# Patient Record
Sex: Male | Born: 1980 | Race: Black or African American | Hispanic: No | Marital: Single | Smoking: Current every day smoker
Health system: Southern US, Community
[De-identification: ages and names within clinical notes are randomized; demographics above are authoritative.]

## PROBLEM LIST (undated history)

## (undated) DIAGNOSIS — K219 Gastro-esophageal reflux disease without esophagitis: Secondary | ICD-10-CM

## (undated) DIAGNOSIS — F410 Panic disorder [episodic paroxysmal anxiety] without agoraphobia: Secondary | ICD-10-CM

## (undated) DIAGNOSIS — IMO0001 Reserved for inherently not codable concepts without codable children: Secondary | ICD-10-CM

## (undated) DIAGNOSIS — F419 Anxiety disorder, unspecified: Secondary | ICD-10-CM

## (undated) HISTORY — PX: URETHRA SURGERY: SHX824

---

## 2004-12-29 ENCOUNTER — Emergency Department: Payer: Self-pay | Admitting: Emergency Medicine

## 2005-06-06 ENCOUNTER — Emergency Department: Payer: Self-pay | Admitting: Emergency Medicine

## 2005-07-02 ENCOUNTER — Emergency Department: Payer: Self-pay | Admitting: General Practice

## 2005-07-29 ENCOUNTER — Emergency Department: Payer: Self-pay | Admitting: Unknown Physician Specialty

## 2005-08-19 ENCOUNTER — Emergency Department: Payer: Self-pay | Admitting: Emergency Medicine

## 2005-09-29 ENCOUNTER — Emergency Department: Payer: Self-pay | Admitting: Emergency Medicine

## 2005-10-04 ENCOUNTER — Emergency Department: Payer: Self-pay | Admitting: Emergency Medicine

## 2005-10-06 ENCOUNTER — Emergency Department: Payer: Self-pay | Admitting: Emergency Medicine

## 2005-10-07 ENCOUNTER — Emergency Department: Payer: Self-pay | Admitting: Emergency Medicine

## 2005-11-11 ENCOUNTER — Emergency Department: Payer: Self-pay | Admitting: Emergency Medicine

## 2006-01-15 ENCOUNTER — Emergency Department: Payer: Self-pay | Admitting: Emergency Medicine

## 2006-03-27 ENCOUNTER — Emergency Department: Payer: Self-pay | Admitting: Emergency Medicine

## 2006-05-16 ENCOUNTER — Emergency Department: Payer: Self-pay | Admitting: Emergency Medicine

## 2006-07-08 ENCOUNTER — Emergency Department: Payer: Self-pay | Admitting: Emergency Medicine

## 2006-07-21 ENCOUNTER — Emergency Department: Payer: Self-pay | Admitting: Emergency Medicine

## 2006-10-20 ENCOUNTER — Emergency Department: Payer: Self-pay | Admitting: Emergency Medicine

## 2006-10-22 ENCOUNTER — Emergency Department: Payer: Self-pay | Admitting: Internal Medicine

## 2006-10-23 ENCOUNTER — Emergency Department: Payer: Self-pay | Admitting: Unknown Physician Specialty

## 2006-11-20 ENCOUNTER — Emergency Department: Payer: Self-pay | Admitting: Emergency Medicine

## 2006-12-01 ENCOUNTER — Emergency Department: Payer: Self-pay | Admitting: Emergency Medicine

## 2006-12-24 ENCOUNTER — Emergency Department: Payer: Self-pay | Admitting: Emergency Medicine

## 2007-04-07 ENCOUNTER — Emergency Department: Payer: Self-pay | Admitting: Emergency Medicine

## 2007-04-28 ENCOUNTER — Emergency Department: Payer: Self-pay | Admitting: Emergency Medicine

## 2007-04-30 ENCOUNTER — Emergency Department: Payer: Self-pay | Admitting: Emergency Medicine

## 2007-05-04 ENCOUNTER — Other Ambulatory Visit: Payer: Self-pay

## 2007-05-04 ENCOUNTER — Emergency Department: Payer: Self-pay | Admitting: Emergency Medicine

## 2007-05-09 ENCOUNTER — Emergency Department: Payer: Self-pay | Admitting: Emergency Medicine

## 2007-07-10 ENCOUNTER — Emergency Department: Payer: Self-pay | Admitting: Emergency Medicine

## 2007-07-18 ENCOUNTER — Emergency Department: Payer: Self-pay | Admitting: Emergency Medicine

## 2007-10-02 ENCOUNTER — Emergency Department: Payer: Self-pay | Admitting: Emergency Medicine

## 2007-10-08 ENCOUNTER — Ambulatory Visit: Payer: Self-pay | Admitting: Surgery

## 2007-10-17 ENCOUNTER — Emergency Department: Payer: Self-pay | Admitting: Emergency Medicine

## 2007-12-04 ENCOUNTER — Other Ambulatory Visit: Payer: Self-pay

## 2007-12-04 ENCOUNTER — Emergency Department: Payer: Self-pay | Admitting: Emergency Medicine

## 2008-01-17 ENCOUNTER — Emergency Department: Payer: Self-pay | Admitting: Emergency Medicine

## 2008-02-20 ENCOUNTER — Emergency Department: Payer: Self-pay | Admitting: Unknown Physician Specialty

## 2008-04-22 ENCOUNTER — Emergency Department: Payer: Self-pay | Admitting: Emergency Medicine

## 2008-06-08 ENCOUNTER — Emergency Department: Payer: Self-pay | Admitting: Emergency Medicine

## 2008-08-04 ENCOUNTER — Emergency Department: Payer: Self-pay | Admitting: Emergency Medicine

## 2008-09-18 ENCOUNTER — Emergency Department: Payer: Self-pay | Admitting: Unknown Physician Specialty

## 2008-12-25 ENCOUNTER — Emergency Department: Payer: Self-pay | Admitting: Emergency Medicine

## 2009-05-04 ENCOUNTER — Emergency Department: Payer: Self-pay | Admitting: Emergency Medicine

## 2009-07-15 ENCOUNTER — Emergency Department: Payer: Self-pay | Admitting: Emergency Medicine

## 2009-09-24 ENCOUNTER — Emergency Department: Payer: Self-pay | Admitting: Emergency Medicine

## 2009-10-17 ENCOUNTER — Emergency Department: Payer: Self-pay | Admitting: Internal Medicine

## 2009-12-28 ENCOUNTER — Emergency Department: Payer: Self-pay | Admitting: Internal Medicine

## 2009-12-30 ENCOUNTER — Emergency Department: Payer: Self-pay | Admitting: Emergency Medicine

## 2010-02-15 ENCOUNTER — Emergency Department: Payer: Self-pay | Admitting: Emergency Medicine

## 2010-03-14 ENCOUNTER — Emergency Department: Payer: Self-pay | Admitting: Emergency Medicine

## 2010-04-06 ENCOUNTER — Emergency Department: Payer: Self-pay | Admitting: Emergency Medicine

## 2010-04-15 ENCOUNTER — Emergency Department: Payer: Self-pay | Admitting: Emergency Medicine

## 2010-04-18 ENCOUNTER — Emergency Department: Payer: Self-pay | Admitting: Emergency Medicine

## 2010-04-23 ENCOUNTER — Emergency Department: Payer: Self-pay | Admitting: Emergency Medicine

## 2010-04-28 ENCOUNTER — Emergency Department: Payer: Self-pay | Admitting: Unknown Physician Specialty

## 2010-05-04 ENCOUNTER — Emergency Department: Payer: Self-pay | Admitting: Emergency Medicine

## 2010-05-05 ENCOUNTER — Emergency Department: Payer: Self-pay | Admitting: Emergency Medicine

## 2010-05-09 ENCOUNTER — Emergency Department: Payer: Self-pay | Admitting: Emergency Medicine

## 2010-05-14 ENCOUNTER — Emergency Department: Payer: Self-pay | Admitting: Emergency Medicine

## 2010-05-16 ENCOUNTER — Emergency Department: Payer: Self-pay | Admitting: Internal Medicine

## 2010-05-17 ENCOUNTER — Emergency Department: Payer: Self-pay | Admitting: Emergency Medicine

## 2010-05-21 ENCOUNTER — Emergency Department: Payer: Self-pay | Admitting: Emergency Medicine

## 2010-05-28 ENCOUNTER — Emergency Department: Payer: Self-pay | Admitting: Emergency Medicine

## 2010-05-30 ENCOUNTER — Emergency Department: Payer: Self-pay | Admitting: Emergency Medicine

## 2010-06-04 ENCOUNTER — Emergency Department: Payer: Self-pay | Admitting: Emergency Medicine

## 2010-06-08 ENCOUNTER — Emergency Department: Payer: Self-pay | Admitting: Emergency Medicine

## 2010-06-11 ENCOUNTER — Emergency Department: Payer: Self-pay | Admitting: Emergency Medicine

## 2010-06-13 ENCOUNTER — Emergency Department: Payer: Self-pay | Admitting: Emergency Medicine

## 2010-06-18 ENCOUNTER — Emergency Department: Payer: Self-pay | Admitting: Emergency Medicine

## 2010-06-20 ENCOUNTER — Emergency Department: Payer: Self-pay | Admitting: Emergency Medicine

## 2010-06-23 ENCOUNTER — Emergency Department: Payer: Self-pay | Admitting: Emergency Medicine

## 2010-06-29 ENCOUNTER — Emergency Department: Payer: Self-pay | Admitting: Emergency Medicine

## 2010-07-01 ENCOUNTER — Emergency Department: Payer: Self-pay | Admitting: Emergency Medicine

## 2010-07-07 ENCOUNTER — Emergency Department: Payer: Self-pay | Admitting: Emergency Medicine

## 2010-07-21 ENCOUNTER — Emergency Department: Payer: Self-pay | Admitting: Emergency Medicine

## 2010-07-25 ENCOUNTER — Emergency Department: Payer: Self-pay | Admitting: Emergency Medicine

## 2010-09-12 ENCOUNTER — Emergency Department: Payer: Self-pay | Admitting: Internal Medicine

## 2010-09-26 ENCOUNTER — Emergency Department: Payer: Self-pay | Admitting: Emergency Medicine

## 2010-10-24 ENCOUNTER — Emergency Department: Payer: Self-pay | Admitting: Emergency Medicine

## 2010-12-07 ENCOUNTER — Emergency Department: Payer: Self-pay | Admitting: Unknown Physician Specialty

## 2010-12-21 ENCOUNTER — Emergency Department: Payer: Self-pay | Admitting: Emergency Medicine

## 2010-12-24 ENCOUNTER — Emergency Department: Payer: Self-pay | Admitting: Internal Medicine

## 2011-02-07 ENCOUNTER — Emergency Department: Payer: Self-pay | Admitting: Emergency Medicine

## 2011-04-21 ENCOUNTER — Emergency Department: Payer: Self-pay | Admitting: Emergency Medicine

## 2011-09-04 ENCOUNTER — Encounter: Payer: Self-pay | Admitting: Emergency Medicine

## 2011-09-04 ENCOUNTER — Emergency Department (HOSPITAL_COMMUNITY)
Admission: EM | Admit: 2011-09-04 | Discharge: 2011-09-05 | Disposition: A | Discharge status: Home / Self Care | Payer: Self-pay | Attending: Emergency Medicine | Admitting: Emergency Medicine

## 2011-09-04 DIAGNOSIS — K120 Recurrent oral aphthae: Secondary | ICD-10-CM | POA: Insufficient documentation

## 2011-09-04 DIAGNOSIS — F17200 Nicotine dependence, unspecified, uncomplicated: Secondary | ICD-10-CM | POA: Insufficient documentation

## 2011-09-04 DIAGNOSIS — R599 Enlarged lymph nodes, unspecified: Secondary | ICD-10-CM | POA: Insufficient documentation

## 2011-09-04 DIAGNOSIS — J45909 Unspecified asthma, uncomplicated: Secondary | ICD-10-CM | POA: Insufficient documentation

## 2011-09-04 DIAGNOSIS — R220 Localized swelling, mass and lump, head: Secondary | ICD-10-CM | POA: Insufficient documentation

## 2011-09-04 DIAGNOSIS — R0602 Shortness of breath: Secondary | ICD-10-CM | POA: Insufficient documentation

## 2011-09-04 DIAGNOSIS — R221 Localized swelling, mass and lump, neck: Secondary | ICD-10-CM | POA: Insufficient documentation

## 2011-09-04 NOTE — ED Notes (Signed)
Patient states he was SOB for several days but today feels better but not has noticed swelling to his right lower jaw and neck area.  Denies fever

## 2011-09-04 NOTE — ED Notes (Signed)
PT. REPORTS SOB WITH PRODUCTIVE COUGH AND RIGHT SORE THROAT FOR 3 DAYS . DENIES FEVER OR CHILLS.

## 2011-09-05 NOTE — ED Provider Notes (Signed)
History     CSN: [id number]  Arrival date & time 09/04/11  2225   First MD Initiated Contact with Patient 09/04/11 2336      Chief Complaint  Patient presents with  . Shortness of Breath    (Consider location/radiation/quality/duration/timing/severity/associated sxs/prior treatment) HPI Comments: Patient with a history of asthma has been using his inhaler for the past couple days.  He has not needed.  Today and is no longer short of breath, but he does have a canker sore in his right lower posterior cheek, now with lymphadenopathy to the mandible area that is quite concerning to this patient  Patient is a 31 y.o. male presenting with shortness of breath. The history is provided by the patient.  Shortness of Breath  The current episode started 3 to 5 days ago. The problem occurs rarely. The problem has been gradually worsening. The problem is moderate. The symptoms are relieved by nothing. Pertinent negatives include no sore throat, no shortness of breath and no wheezing.    Past Medical History  Diagnosis Date  . Asthma     Past Surgical History  Procedure Date  . Urethra surgery     No family history on file.  History  Substance Use Topics  . Smoking status: Current Everyday Smoker  . Smokeless tobacco: Not on file  . Alcohol Use: Yes      Review of Systems  Constitutional: Negative.   HENT: Positive for facial swelling. Negative for sore throat, neck pain and neck stiffness.   Respiratory: Negative for shortness of breath and wheezing.   Genitourinary: Negative.   Musculoskeletal: Negative.   Skin: Negative.   Neurological: Negative for dizziness.  Hematological: Negative.   Psychiatric/Behavioral: Negative.     Allergies  Codeine  Home Medications  No current outpatient prescriptions on file.  BP 117/65  Pulse 100  Temp(Src) 98.5 F (36.9 C) (Oral)  Resp 18  SpO2 97%  Physical Exam  Constitutional: He is oriented to person, place, and time. He  appears well-developed.  Eyes: Pupils are equal, round, and reactive to light.  Neck:    Cardiovascular: Normal rate.   Pulmonary/Chest: Effort normal. No respiratory distress. He has no wheezes.  Musculoskeletal: Normal range of motion.  Neurological: He is oriented to person, place, and time.  Skin: Skin is warm.    ED Course  Procedures (including critical care time)  Labs Reviewed - No data to display No results found.   1. Canker sores oral       MDM  Canker sore with lymphadenopathy, most likely secondary infection.  Will treat with oral tetracycline, that he we'll hold in his mouth for 3-5 minutes and then swallow        Arman Filter, NP 09/05/11 0101  Arman Filter, NP 09/05/11 0110

## 2011-09-05 NOTE — ED Notes (Signed)
Discharge inst given  Voiced understanding 

## 2011-09-05 NOTE — ED Provider Notes (Signed)
Medical screening examination/treatment/procedure(s) were performed by non-physician practitioner and as supervising physician I was immediately available for consultation/collaboration.   Taleen Prosser, MD 09/05/11 0537 

## 2011-11-11 ENCOUNTER — Emergency Department: Payer: Self-pay | Admitting: Emergency Medicine

## 2011-12-21 ENCOUNTER — Emergency Department: Payer: Self-pay | Admitting: Emergency Medicine

## 2012-02-20 ENCOUNTER — Emergency Department: Payer: Self-pay | Admitting: Emergency Medicine

## 2012-03-26 ENCOUNTER — Emergency Department: Payer: Self-pay | Admitting: Emergency Medicine

## 2012-05-04 ENCOUNTER — Emergency Department: Payer: Self-pay | Admitting: Emergency Medicine

## 2012-08-20 ENCOUNTER — Emergency Department: Payer: Self-pay | Admitting: Emergency Medicine

## 2012-08-20 LAB — COMPREHENSIVE METABOLIC PANEL
Alkaline Phosphatase: 62 U/L (ref 50–136)
Bilirubin,Total: 0.8 mg/dL (ref 0.2–1.0)
Chloride: 104 mmol/L (ref 98–107)
Co2: 25 mmol/L (ref 21–32)
Creatinine: 0.88 mg/dL (ref 0.60–1.30)
EGFR (Non-African Amer.): >60
Osmolality: 276 (ref 275–301)
Potassium: 3.9 mmol/L (ref 3.5–5.1)
SGOT(AST): 18 U/L (ref 15–37)
Sodium: 139 mmol/L (ref 136–145)

## 2012-08-20 LAB — LIPASE, BLOOD: Lipase: 160 U/L (ref 73–393)

## 2012-08-20 LAB — CBC
HCT: 43.6 % (ref 40.0–52.0)
MCH: 29.8 pg (ref 26.0–34.0)
RDW: 13.6 % (ref 11.5–14.5)

## 2012-09-07 ENCOUNTER — Emergency Department: Payer: Self-pay | Admitting: Emergency Medicine

## 2012-09-24 ENCOUNTER — Emergency Department: Payer: Self-pay | Admitting: Unknown Physician Specialty

## 2012-10-11 ENCOUNTER — Emergency Department: Payer: Self-pay | Admitting: Emergency Medicine

## 2012-10-17 ENCOUNTER — Emergency Department: Payer: Self-pay | Admitting: Emergency Medicine

## 2012-10-19 ENCOUNTER — Emergency Department: Payer: Self-pay | Admitting: Emergency Medicine

## 2013-01-15 ENCOUNTER — Emergency Department: Payer: Self-pay | Admitting: Unknown Physician Specialty

## 2013-02-05 ENCOUNTER — Emergency Department: Payer: Self-pay | Admitting: Emergency Medicine

## 2013-02-05 LAB — CBC
HCT: 42.1 % (ref 40.0–52.0)
HGB: 14.4 g/dL (ref 13.0–18.0)
MCH: 30.1 pg (ref 26.0–34.0)
MCHC: 34.2 g/dL (ref 32.0–36.0)
MCV: 88 fL (ref 80–100)
Platelet: 205 10*3/uL (ref 150–440)
RBC: 4.78 10*6/uL (ref 4.40–5.90)
RDW: 12.9 % (ref 11.5–14.5)

## 2013-02-05 LAB — BASIC METABOLIC PANEL
Anion Gap: 8 (ref 7–16)
BUN: 9 mg/dL (ref 7–18)
Co2: 25 mmol/L (ref 21–32)
Creatinine: 0.83 mg/dL (ref 0.60–1.30)
EGFR (African American): >60
Glucose: 119 mg/dL — ABNORMAL HIGH (ref 65–99)
Potassium: 3.2 mmol/L — ABNORMAL LOW (ref 3.5–5.1)
Sodium: 140 mmol/L (ref 136–145)

## 2013-02-05 LAB — HEPATIC FUNCTION PANEL A (ARMC)
Albumin: 3.8 g/dL (ref 3.4–5.0)
Alkaline Phosphatase: 58 U/L (ref 50–136)
SGOT(AST): 33 U/L (ref 15–37)
SGPT (ALT): 50 U/L (ref 12–78)

## 2013-02-18 ENCOUNTER — Emergency Department: Payer: Self-pay | Admitting: Emergency Medicine

## 2013-04-08 ENCOUNTER — Emergency Department: Payer: Self-pay | Admitting: Emergency Medicine

## 2013-04-30 ENCOUNTER — Emergency Department: Payer: Self-pay | Admitting: Emergency Medicine

## 2013-06-17 ENCOUNTER — Emergency Department: Payer: Self-pay | Admitting: Emergency Medicine

## 2013-06-17 LAB — CBC
HGB: 14.1 g/dL (ref 13.0–18.0)
MCH: 30 pg (ref 26.0–34.0)

## 2013-06-17 LAB — BASIC METABOLIC PANEL
Anion Gap: 6 — ABNORMAL LOW (ref 7–16)
BUN: 10 mg/dL (ref 7–18)
Calcium, Total: 8.9 mg/dL (ref 8.5–10.1)
Chloride: 105 mmol/L (ref 98–107)
Co2: 27 mmol/L (ref 21–32)
EGFR (African American): >60
EGFR (Non-African Amer.): >60
Osmolality: 275 (ref 275–301)
Potassium: 3.3 mmol/L — ABNORMAL LOW (ref 3.5–5.1)

## 2013-06-17 LAB — TROPONIN I: Troponin-I: <0.02 ng/mL

## 2013-08-05 ENCOUNTER — Emergency Department: Payer: Self-pay | Admitting: Internal Medicine

## 2013-08-08 ENCOUNTER — Emergency Department: Payer: Self-pay | Admitting: Internal Medicine

## 2013-08-24 ENCOUNTER — Emergency Department: Payer: Self-pay | Admitting: Emergency Medicine

## 2013-09-07 ENCOUNTER — Emergency Department: Payer: Self-pay | Admitting: Emergency Medicine

## 2013-09-21 ENCOUNTER — Emergency Department: Payer: Self-pay | Admitting: Internal Medicine

## 2013-10-01 ENCOUNTER — Emergency Department: Payer: Self-pay | Admitting: Emergency Medicine

## 2013-10-23 ENCOUNTER — Emergency Department: Payer: Self-pay | Admitting: Emergency Medicine

## 2013-10-23 LAB — BASIC METABOLIC PANEL
Anion Gap: 5 — ABNORMAL LOW (ref 7–16)
BUN: 9 mg/dL (ref 7–18)
CHLORIDE: 105 mmol/L (ref 98–107)
Calcium, Total: 9.1 mg/dL (ref 8.5–10.1)
Co2: 27 mmol/L (ref 21–32)
Creatinine: 0.8 mg/dL (ref 0.60–1.30)
Glucose: 103 mg/dL — ABNORMAL HIGH (ref 65–99)
OSMOLALITY: 273 (ref 275–301)
Potassium: 3.7 mmol/L (ref 3.5–5.1)
SODIUM: 137 mmol/L (ref 136–145)

## 2013-10-23 LAB — CBC
HCT: 44.3 % (ref 40.0–52.0)
HGB: 15.1 g/dL (ref 13.0–18.0)
MCH: 30.7 pg (ref 26.0–34.0)
MCHC: 34.1 g/dL (ref 32.0–36.0)
MCV: 90 fL (ref 80–100)
PLATELETS: 192 10*3/uL (ref 150–440)
RBC: 4.93 10*6/uL (ref 4.40–5.90)
RDW: 13.8 % (ref 11.5–14.5)
WBC: 5.3 10*3/uL (ref 3.8–10.6)

## 2013-10-23 LAB — TROPONIN I: Troponin-I: <0.02 ng/mL

## 2013-10-29 ENCOUNTER — Emergency Department: Payer: Self-pay | Admitting: Emergency Medicine

## 2013-10-31 ENCOUNTER — Emergency Department: Payer: Self-pay | Admitting: Emergency Medicine

## 2013-11-05 ENCOUNTER — Emergency Department: Payer: Self-pay | Admitting: Emergency Medicine

## 2013-12-04 ENCOUNTER — Emergency Department: Payer: Self-pay | Admitting: Emergency Medicine

## 2013-12-04 LAB — CBC
HCT: 46.1 % (ref 40.0–52.0)
HGB: 15.8 g/dL (ref 13.0–18.0)
MCH: 31 pg (ref 26.0–34.0)
MCHC: 34.2 g/dL (ref 32.0–36.0)
MCV: 91 fL (ref 80–100)
Platelet: 205 10*3/uL (ref 150–440)
RBC: 5.09 10*6/uL (ref 4.40–5.90)
RDW: 13.2 % (ref 11.5–14.5)
WBC: 7.2 10*3/uL (ref 3.8–10.6)

## 2013-12-04 LAB — URINALYSIS, COMPLETE
BILIRUBIN, UR: NEGATIVE
BLOOD: NEGATIVE
Bacteria: NONE SEEN
Glucose,UR: NEGATIVE mg/dL (ref 0–75)
Ketone: NEGATIVE
Leukocyte Esterase: NEGATIVE
Nitrite: NEGATIVE
Ph: 6 (ref 4.5–8.0)
Protein: NEGATIVE
Specific Gravity: 1.02 (ref 1.003–1.030)
Squamous Epithelial: 1
WBC UR: 2 /HPF (ref 0–5)

## 2013-12-04 LAB — COMPREHENSIVE METABOLIC PANEL WITH GFR
Albumin: 4 g/dL
Alkaline Phosphatase: 62 U/L
Anion Gap: 3 — ABNORMAL LOW
BUN: 12 mg/dL
Bilirubin,Total: 0.6 mg/dL
Calcium, Total: 9.2 mg/dL
Chloride: 102 mmol/L
Co2: 30 mmol/L
Creatinine: 0.87 mg/dL
EGFR (African American): >60
EGFR (Non-African Amer.): >60
Glucose: 87 mg/dL
Osmolality: 269
Potassium: 3.4 mmol/L — ABNORMAL LOW
SGOT(AST): 34 U/L
SGPT (ALT): 53 U/L
Sodium: 135 mmol/L — ABNORMAL LOW
Total Protein: 8.3 g/dL — ABNORMAL HIGH

## 2013-12-20 ENCOUNTER — Emergency Department: Payer: Self-pay | Admitting: Emergency Medicine

## 2013-12-20 LAB — URINALYSIS, COMPLETE
BILIRUBIN, UR: NEGATIVE
BLOOD: NEGATIVE
Bacteria: NONE SEEN
GLUCOSE, UR: NEGATIVE mg/dL (ref 0–75)
Ketone: NEGATIVE
Leukocyte Esterase: NEGATIVE
Nitrite: NEGATIVE
PH: 5 (ref 4.5–8.0)
Protein: NEGATIVE
RBC, UR: NONE SEEN /HPF (ref 0–5)
Specific Gravity: 1.023 (ref 1.003–1.030)
Squamous Epithelial: <1
WBC UR: 2 /HPF (ref 0–5)

## 2013-12-22 LAB — BETA STREP CULTURE(ARMC)

## 2013-12-24 ENCOUNTER — Emergency Department: Payer: Self-pay | Admitting: Emergency Medicine

## 2014-01-15 ENCOUNTER — Emergency Department: Payer: Self-pay

## 2014-01-15 LAB — CBC
HCT: 46.2 % (ref 40.0–52.0)
HGB: 15.3 g/dL (ref 13.0–18.0)
MCH: 29.7 pg (ref 26.0–34.0)
MCHC: 33 g/dL (ref 32.0–36.0)
MCV: 90 fL (ref 80–100)
Platelet: 240 10*3/uL (ref 150–440)
RBC: 5.14 10*6/uL (ref 4.40–5.90)
RDW: 13.2 % (ref 11.5–14.5)
WBC: 5.5 10*3/uL (ref 3.8–10.6)

## 2014-01-15 LAB — BASIC METABOLIC PANEL
Anion Gap: 6 — ABNORMAL LOW (ref 7–16)
BUN: 8 mg/dL (ref 7–18)
CHLORIDE: 103 mmol/L (ref 98–107)
CREATININE: 0.84 mg/dL (ref 0.60–1.30)
Calcium, Total: 8.9 mg/dL (ref 8.5–10.1)
Co2: 27 mmol/L (ref 21–32)
EGFR (Non-African Amer.): >60
GLUCOSE: 117 mg/dL — AB (ref 65–99)
OSMOLALITY: 271 (ref 275–301)
Potassium: 3.5 mmol/L (ref 3.5–5.1)
Sodium: 136 mmol/L (ref 136–145)

## 2014-01-15 LAB — TROPONIN I

## 2014-01-18 ENCOUNTER — Emergency Department: Payer: Self-pay | Admitting: Emergency Medicine

## 2014-01-30 ENCOUNTER — Emergency Department: Payer: Self-pay | Admitting: Emergency Medicine

## 2014-02-08 ENCOUNTER — Emergency Department: Payer: Self-pay | Admitting: Emergency Medicine

## 2014-02-08 LAB — BASIC METABOLIC PANEL
Anion Gap: 6 — ABNORMAL LOW (ref 7–16)
BUN: 10 mg/dL (ref 7–18)
CALCIUM: 9.5 mg/dL (ref 8.5–10.1)
CHLORIDE: 102 mmol/L (ref 98–107)
CREATININE: 0.86 mg/dL (ref 0.60–1.30)
Co2: 24 mmol/L (ref 21–32)
EGFR (African American): >60
Glucose: 91 mg/dL (ref 65–99)
OSMOLALITY: 263 (ref 275–301)
POTASSIUM: 3.4 mmol/L — AB (ref 3.5–5.1)
SODIUM: 132 mmol/L — AB (ref 136–145)

## 2014-02-08 LAB — CBC
HCT: 44.2 % (ref 40.0–52.0)
HGB: 15.2 g/dL (ref 13.0–18.0)
MCH: 31.1 pg (ref 26.0–34.0)
MCHC: 34.3 g/dL (ref 32.0–36.0)
MCV: 91 fL (ref 80–100)
PLATELETS: 218 10*3/uL (ref 150–440)
RBC: 4.88 10*6/uL (ref 4.40–5.90)
RDW: 13.2 % (ref 11.5–14.5)
WBC: 6.4 10*3/uL (ref 3.8–10.6)

## 2014-02-08 LAB — TROPONIN I: Troponin-I: <0.02 ng/mL

## 2014-02-15 ENCOUNTER — Emergency Department: Payer: Self-pay | Admitting: Emergency Medicine

## 2014-02-15 LAB — BASIC METABOLIC PANEL
Anion Gap: 11 (ref 7–16)
BUN: 17 mg/dL (ref 7–18)
CALCIUM: 9 mg/dL (ref 8.5–10.1)
CREATININE: 1.02 mg/dL (ref 0.60–1.30)
Chloride: 102 mmol/L (ref 98–107)
Co2: 23 mmol/L (ref 21–32)
GLUCOSE: 101 mg/dL — AB (ref 65–99)
Osmolality: 274 (ref 275–301)
Potassium: 3.3 mmol/L — ABNORMAL LOW (ref 3.5–5.1)
SODIUM: 136 mmol/L (ref 136–145)

## 2014-02-15 LAB — CBC
HCT: 46 % (ref 40.0–52.0)
HGB: 15.2 g/dL (ref 13.0–18.0)
MCH: 30 pg (ref 26.0–34.0)
MCHC: 33.1 g/dL (ref 32.0–36.0)
MCV: 91 fL (ref 80–100)
Platelet: 205 10*3/uL (ref 150–440)
RBC: 5.06 10*6/uL (ref 4.40–5.90)
RDW: 13.3 % (ref 11.5–14.5)
WBC: 8.9 10*3/uL (ref 3.8–10.6)

## 2014-02-15 LAB — TROPONIN I

## 2014-03-13 ENCOUNTER — Emergency Department: Payer: Self-pay | Admitting: Emergency Medicine

## 2014-03-19 ENCOUNTER — Emergency Department: Payer: Self-pay | Admitting: Emergency Medicine

## 2014-04-02 ENCOUNTER — Emergency Department: Payer: Self-pay | Admitting: Emergency Medicine

## 2014-04-09 ENCOUNTER — Emergency Department: Payer: Self-pay | Admitting: Emergency Medicine

## 2014-04-13 ENCOUNTER — Emergency Department: Payer: Self-pay | Admitting: Emergency Medicine

## 2014-04-26 ENCOUNTER — Emergency Department: Payer: Self-pay | Admitting: Emergency Medicine

## 2014-05-05 ENCOUNTER — Emergency Department: Payer: Self-pay | Admitting: Emergency Medicine

## 2014-05-05 LAB — CBC WITH DIFFERENTIAL/PLATELET
Basophil #: 0 10*3/uL (ref 0.0–0.1)
Basophil %: 0.7 %
Eosinophil #: 0.2 10*3/uL (ref 0.0–0.7)
Eosinophil %: 2.4 %
HCT: 43.7 % (ref 40.0–52.0)
HGB: 15 g/dL (ref 13.0–18.0)
LYMPHS ABS: 1.8 10*3/uL (ref 1.0–3.6)
Lymphocyte %: 27.3 %
MCH: 31 pg (ref 26.0–34.0)
MCHC: 34.3 g/dL (ref 32.0–36.0)
MCV: 90 fL (ref 80–100)
MONOS PCT: 9.8 %
Monocyte #: 0.6 x10 3/mm (ref 0.2–1.0)
NEUTROS ABS: 3.9 10*3/uL (ref 1.4–6.5)
NEUTROS PCT: 59.8 %
Platelet: 203 10*3/uL (ref 150–440)
RBC: 4.83 10*6/uL (ref 4.40–5.90)
RDW: 12.8 % (ref 11.5–14.5)
WBC: 6.5 10*3/uL (ref 3.8–10.6)

## 2014-05-05 LAB — COMPREHENSIVE METABOLIC PANEL
ALBUMIN: 3.9 g/dL (ref 3.4–5.0)
ALK PHOS: 58 U/L
ANION GAP: 11 (ref 7–16)
AST: 42 U/L — AB (ref 15–37)
BILIRUBIN TOTAL: 0.7 mg/dL (ref 0.2–1.0)
BUN: 12 mg/dL (ref 7–18)
CREATININE: 0.91 mg/dL (ref 0.60–1.30)
Calcium, Total: 9.4 mg/dL (ref 8.5–10.1)
Chloride: 101 mmol/L (ref 98–107)
Co2: 27 mmol/L (ref 21–32)
EGFR (African American): >60
GLUCOSE: 100 mg/dL — AB (ref 65–99)
Osmolality: 277 (ref 275–301)
POTASSIUM: 3.5 mmol/L (ref 3.5–5.1)
SGPT (ALT): 65 U/L — ABNORMAL HIGH
Sodium: 139 mmol/L (ref 136–145)
TOTAL PROTEIN: 8 g/dL (ref 6.4–8.2)

## 2014-05-05 LAB — URINALYSIS, COMPLETE
Bacteria: NONE SEEN
Bilirubin,UR: NEGATIVE
Blood: NEGATIVE
Glucose,UR: NEGATIVE mg/dL (ref 0–75)
Ketone: NEGATIVE
LEUKOCYTE ESTERASE: NEGATIVE
Nitrite: NEGATIVE
PROTEIN: NEGATIVE
Ph: 6 (ref 4.5–8.0)
RBC,UR: <1 /HPF (ref 0–5)
Specific Gravity: 1.017 (ref 1.003–1.030)
Squamous Epithelial: 2

## 2014-05-05 LAB — DRUG SCREEN, URINE
Amphetamines, Ur Screen: NEGATIVE (ref ?–1000)
Barbiturates, Ur Screen: NEGATIVE (ref ?–200)
Benzodiazepine, Ur Scrn: NEGATIVE (ref ?–200)
COCAINE METABOLITE, UR ~~LOC~~: NEGATIVE (ref ?–300)
Cannabinoid 50 Ng, Ur ~~LOC~~: NEGATIVE (ref ?–50)
MDMA (ECSTASY) UR SCREEN: NEGATIVE (ref ?–500)
Methadone, Ur Screen: NEGATIVE (ref ?–300)
OPIATE, UR SCREEN: NEGATIVE (ref ?–300)
Phencyclidine (PCP) Ur S: NEGATIVE (ref ?–25)
Tricyclic, Ur Screen: NEGATIVE (ref ?–1000)

## 2014-05-05 LAB — ETHANOL: Ethanol: <3 mg/dL

## 2014-05-05 LAB — TROPONIN I: Troponin-I: <0.02 ng/mL

## 2014-05-05 LAB — LIPASE, BLOOD: Lipase: 227 U/L (ref 73–393)

## 2014-05-13 ENCOUNTER — Emergency Department: Payer: Self-pay | Admitting: Emergency Medicine

## 2014-05-13 LAB — TROPONIN I

## 2014-05-13 LAB — CBC WITH DIFFERENTIAL/PLATELET
Basophil #: 0 10*3/uL (ref 0.0–0.1)
Basophil %: 0.7 %
EOS PCT: 1.9 %
Eosinophil #: 0.1 10*3/uL (ref 0.0–0.7)
HCT: 44.8 % (ref 40.0–52.0)
HGB: 15.3 g/dL (ref 13.0–18.0)
LYMPHS ABS: 1.5 10*3/uL (ref 1.0–3.6)
Lymphocyte %: 27.9 %
MCH: 30.7 pg (ref 26.0–34.0)
MCHC: 34.2 g/dL (ref 32.0–36.0)
MCV: 90 fL (ref 80–100)
Monocyte #: 0.5 x10 3/mm (ref 0.2–1.0)
Monocyte %: 10.1 %
Neutrophil #: 3.2 10*3/uL (ref 1.4–6.5)
Neutrophil %: 59.4 %
Platelet: 208 10*3/uL (ref 150–440)
RBC: 4.99 10*6/uL (ref 4.40–5.90)
RDW: 13.3 % (ref 11.5–14.5)
WBC: 5.4 10*3/uL (ref 3.8–10.6)

## 2014-05-13 LAB — COMPREHENSIVE METABOLIC PANEL
ALK PHOS: 53 U/L
ANION GAP: 9 (ref 7–16)
Albumin: 3.6 g/dL (ref 3.4–5.0)
BILIRUBIN TOTAL: 0.7 mg/dL (ref 0.2–1.0)
BUN: 8 mg/dL (ref 7–18)
CHLORIDE: 104 mmol/L (ref 98–107)
Calcium, Total: 8.5 mg/dL (ref 8.5–10.1)
Co2: 26 mmol/L (ref 21–32)
Creatinine: 0.91 mg/dL (ref 0.60–1.30)
Glucose: 93 mg/dL (ref 65–99)
OSMOLALITY: 276 (ref 275–301)
Potassium: 3.9 mmol/L (ref 3.5–5.1)
SGOT(AST): 42 U/L — ABNORMAL HIGH (ref 15–37)
SGPT (ALT): 50 U/L
Sodium: 139 mmol/L (ref 136–145)
Total Protein: 7.6 g/dL (ref 6.4–8.2)

## 2014-05-13 LAB — LIPASE, BLOOD: LIPASE: 141 U/L (ref 73–393)

## 2014-05-18 ENCOUNTER — Emergency Department: Payer: Self-pay | Admitting: Emergency Medicine

## 2014-05-24 ENCOUNTER — Emergency Department: Payer: Self-pay | Admitting: Emergency Medicine

## 2014-06-01 ENCOUNTER — Emergency Department: Payer: Self-pay | Admitting: Emergency Medicine

## 2014-06-07 ENCOUNTER — Emergency Department: Payer: Self-pay | Admitting: Emergency Medicine

## 2014-06-16 DIAGNOSIS — F419 Anxiety disorder, unspecified: Secondary | ICD-10-CM | POA: Insufficient documentation

## 2014-06-16 DIAGNOSIS — K219 Gastro-esophageal reflux disease without esophagitis: Secondary | ICD-10-CM | POA: Insufficient documentation

## 2014-06-18 ENCOUNTER — Emergency Department: Payer: Self-pay | Admitting: Emergency Medicine

## 2014-06-18 LAB — COMPREHENSIVE METABOLIC PANEL
ALBUMIN: 3.7 g/dL (ref 3.4–5.0)
ALT: 49 U/L
ANION GAP: 11 (ref 7–16)
Alkaline Phosphatase: 54 U/L
BUN: 11 mg/dL (ref 7–18)
Bilirubin,Total: 0.6 mg/dL (ref 0.2–1.0)
CALCIUM: 9 mg/dL (ref 8.5–10.1)
CHLORIDE: 103 mmol/L (ref 98–107)
Co2: 26 mmol/L (ref 21–32)
Creatinine: 0.9 mg/dL (ref 0.60–1.30)
EGFR (African American): >60
Glucose: 96 mg/dL (ref 65–99)
OSMOLALITY: 279 (ref 275–301)
Potassium: 3.5 mmol/L (ref 3.5–5.1)
SGOT(AST): 32 U/L (ref 15–37)
SODIUM: 140 mmol/L (ref 136–145)
TOTAL PROTEIN: 7.8 g/dL (ref 6.4–8.2)

## 2014-06-18 LAB — URINALYSIS, COMPLETE
BILIRUBIN, UR: NEGATIVE
BLOOD: NEGATIVE
Bacteria: NONE SEEN
Glucose,UR: NEGATIVE mg/dL (ref 0–75)
Ketone: NEGATIVE
Leukocyte Esterase: NEGATIVE
NITRITE: NEGATIVE
Ph: 6 (ref 4.5–8.0)
Protein: NEGATIVE
RBC,UR: <1 /HPF (ref 0–5)
Specific Gravity: 1.015 (ref 1.003–1.030)
Squamous Epithelial: 1
WBC UR: 3 /HPF (ref 0–5)

## 2014-06-18 LAB — CBC WITH DIFFERENTIAL/PLATELET
BASOS PCT: 1.6 %
Basophil #: 0.1 10*3/uL (ref 0.0–0.1)
EOS ABS: 0.1 10*3/uL (ref 0.0–0.7)
Eosinophil %: 1.8 %
HCT: 43.4 % (ref 40.0–52.0)
HGB: 14.7 g/dL (ref 13.0–18.0)
Lymphocyte #: 2.1 10*3/uL (ref 1.0–3.6)
Lymphocyte %: 33.7 %
MCH: 30.6 pg (ref 26.0–34.0)
MCHC: 33.8 g/dL (ref 32.0–36.0)
MCV: 91 fL (ref 80–100)
MONO ABS: 0.3 x10 3/mm (ref 0.2–1.0)
MONOS PCT: 5.4 %
NEUTROS PCT: 57.5 %
Neutrophil #: 3.7 10*3/uL (ref 1.4–6.5)
PLATELETS: 217 10*3/uL (ref 150–440)
RBC: 4.79 10*6/uL (ref 4.40–5.90)
RDW: 13 % (ref 11.5–14.5)
WBC: 6.4 10*3/uL (ref 3.8–10.6)

## 2014-06-18 LAB — LIPASE, BLOOD: LIPASE: 247 U/L (ref 73–393)

## 2014-06-19 ENCOUNTER — Ambulatory Visit: Payer: Self-pay | Admitting: Gastroenterology

## 2014-07-15 ENCOUNTER — Emergency Department: Payer: Self-pay | Admitting: Emergency Medicine

## 2014-07-15 LAB — BASIC METABOLIC PANEL
ANION GAP: 6 — AB (ref 7–16)
BUN: 11 mg/dL (ref 7–18)
CREATININE: 0.86 mg/dL (ref 0.60–1.30)
Calcium, Total: 8.5 mg/dL (ref 8.5–10.1)
Chloride: 103 mmol/L (ref 98–107)
Co2: 26 mmol/L (ref 21–32)
EGFR (African American): >60
EGFR (Non-African Amer.): >60
Glucose: 102 mg/dL — ABNORMAL HIGH (ref 65–99)
Osmolality: 270 (ref 275–301)
Potassium: 3.6 mmol/L (ref 3.5–5.1)
Sodium: 135 mmol/L — ABNORMAL LOW (ref 136–145)

## 2014-07-15 LAB — CBC
HCT: 44.5 % (ref 40.0–52.0)
HGB: 14.7 g/dL (ref 13.0–18.0)
MCH: 30.1 pg (ref 26.0–34.0)
MCHC: 33 g/dL (ref 32.0–36.0)
MCV: 91 fL (ref 80–100)
Platelet: 241 10*3/uL (ref 150–440)
RBC: 4.87 10*6/uL (ref 4.40–5.90)
RDW: 13.2 % (ref 11.5–14.5)
WBC: 6.2 10*3/uL (ref 3.8–10.6)

## 2014-07-15 LAB — TROPONIN I

## 2014-07-23 ENCOUNTER — Emergency Department: Payer: Self-pay | Admitting: Emergency Medicine

## 2014-07-27 ENCOUNTER — Emergency Department: Payer: Self-pay | Admitting: Internal Medicine

## 2014-07-31 ENCOUNTER — Emergency Department: Payer: Self-pay | Admitting: Emergency Medicine

## 2014-07-31 LAB — URINALYSIS, COMPLETE
BACTERIA: NONE SEEN
BILIRUBIN, UR: NEGATIVE
Blood: NEGATIVE
Glucose,UR: NEGATIVE mg/dL (ref 0–75)
Ketone: NEGATIVE
Leukocyte Esterase: NEGATIVE
Nitrite: NEGATIVE
Ph: 6 (ref 4.5–8.0)
Protein: NEGATIVE
RBC, UR: NONE SEEN /HPF (ref 0–5)
Specific Gravity: 1.006 (ref 1.003–1.030)
WBC UR: 1 /HPF (ref 0–5)

## 2014-07-31 LAB — COMPREHENSIVE METABOLIC PANEL
ALBUMIN: 4 g/dL (ref 3.4–5.0)
ALT: 54 U/L
AST: 44 U/L — AB (ref 15–37)
Alkaline Phosphatase: 58 U/L
Anion Gap: 9 (ref 7–16)
BUN: 11 mg/dL (ref 7–18)
Bilirubin,Total: 0.7 mg/dL (ref 0.2–1.0)
CREATININE: 0.91 mg/dL (ref 0.60–1.30)
Calcium, Total: 9.1 mg/dL (ref 8.5–10.1)
Chloride: 98 mmol/L (ref 98–107)
Co2: 30 mmol/L (ref 21–32)
EGFR (African American): >60
Glucose: 101 mg/dL — ABNORMAL HIGH (ref 65–99)
OSMOLALITY: 273 (ref 275–301)
Potassium: 3.4 mmol/L — ABNORMAL LOW (ref 3.5–5.1)
SODIUM: 137 mmol/L (ref 136–145)
Total Protein: 8 g/dL (ref 6.4–8.2)

## 2014-07-31 LAB — CBC WITH DIFFERENTIAL/PLATELET
BASOS ABS: 0.1 10*3/uL (ref 0.0–0.1)
Basophil %: 0.9 %
EOS ABS: 0.1 10*3/uL (ref 0.0–0.7)
Eosinophil %: 1.5 %
HCT: 45.4 % (ref 40.0–52.0)
HGB: 15.4 g/dL (ref 13.0–18.0)
Lymphocyte #: 2 10*3/uL (ref 1.0–3.6)
Lymphocyte %: 25.5 %
MCH: 30.6 pg (ref 26.0–34.0)
MCHC: 33.8 g/dL (ref 32.0–36.0)
MCV: 91 fL (ref 80–100)
Monocyte #: 0.8 x10 3/mm (ref 0.2–1.0)
Monocyte %: 10.4 %
Neutrophil #: 4.9 10*3/uL (ref 1.4–6.5)
Neutrophil %: 61.7 %
PLATELETS: 240 10*3/uL (ref 150–440)
RBC: 5.02 10*6/uL (ref 4.40–5.90)
RDW: 13 % (ref 11.5–14.5)
WBC: 8 10*3/uL (ref 3.8–10.6)

## 2014-07-31 LAB — LIPASE, BLOOD: Lipase: 192 U/L (ref 73–393)

## 2014-08-03 ENCOUNTER — Emergency Department: Payer: Self-pay | Admitting: Emergency Medicine

## 2014-08-04 ENCOUNTER — Emergency Department: Payer: Self-pay | Admitting: Emergency Medicine

## 2014-08-05 LAB — BETA STREP CULTURE(ARMC)

## 2014-08-06 ENCOUNTER — Emergency Department: Payer: Self-pay | Admitting: Emergency Medicine

## 2014-08-11 ENCOUNTER — Emergency Department: Payer: Self-pay | Admitting: Emergency Medicine

## 2014-08-29 ENCOUNTER — Emergency Department: Payer: Self-pay | Admitting: Emergency Medicine

## 2014-09-04 ENCOUNTER — Emergency Department: Payer: Self-pay | Admitting: Emergency Medicine

## 2014-09-14 ENCOUNTER — Emergency Department: Payer: Self-pay | Admitting: Emergency Medicine

## 2014-09-14 LAB — URINALYSIS, COMPLETE
Bacteria: NONE SEEN
Bilirubin,UR: NEGATIVE
Blood: NEGATIVE
Glucose,UR: NEGATIVE mg/dL
Ketone: NEGATIVE
Leukocyte Esterase: NEGATIVE
Nitrite: NEGATIVE
Ph: 6
Protein: NEGATIVE
RBC,UR: 1 /HPF
Specific Gravity: 1.024
Squamous Epithelial: 2
WBC UR: 5 /HPF

## 2014-09-14 LAB — CBC WITH DIFFERENTIAL/PLATELET
BASOS ABS: 0.1 10*3/uL (ref 0.0–0.1)
Basophil %: 0.8 %
EOS ABS: 0.1 10*3/uL (ref 0.0–0.7)
Eosinophil %: 1.9 %
HCT: 44.3 % (ref 40.0–52.0)
HGB: 15.2 g/dL (ref 13.0–18.0)
Lymphocyte #: 2.3 10*3/uL (ref 1.0–3.6)
Lymphocyte %: 29.6 %
MCH: 30.6 pg (ref 26.0–34.0)
MCHC: 34.3 g/dL (ref 32.0–36.0)
MCV: 89 fL (ref 80–100)
Monocyte #: 0.6 x10 3/mm (ref 0.2–1.0)
Monocyte %: 7.8 %
NEUTROS PCT: 59.9 %
Neutrophil #: 4.6 10*3/uL (ref 1.4–6.5)
Platelet: 211 10*3/uL (ref 150–440)
RBC: 4.96 10*6/uL (ref 4.40–5.90)
RDW: 13.1 % (ref 11.5–14.5)
WBC: 7.7 10*3/uL (ref 3.8–10.6)

## 2014-09-14 LAB — COMPREHENSIVE METABOLIC PANEL
Albumin: 3.8 g/dL (ref 3.4–5.0)
Alkaline Phosphatase: 59 U/L
Anion Gap: 10 (ref 7–16)
BUN: 10 mg/dL (ref 7–18)
Bilirubin,Total: 0.6 mg/dL (ref 0.2–1.0)
Calcium, Total: 8.4 mg/dL — ABNORMAL LOW (ref 8.5–10.1)
Chloride: 103 mmol/L (ref 98–107)
Co2: 26 mmol/L (ref 21–32)
Creatinine: 0.93 mg/dL (ref 0.60–1.30)
EGFR (African American): >60
EGFR (Non-African Amer.): >60
Glucose: 97 mg/dL (ref 65–99)
Osmolality: 277 (ref 275–301)
Potassium: 3.6 mmol/L (ref 3.5–5.1)
SGOT(AST): 31 U/L (ref 15–37)
SGPT (ALT): 46 U/L
Sodium: 139 mmol/L (ref 136–145)
Total Protein: 7.7 g/dL (ref 6.4–8.2)

## 2014-09-14 LAB — LIPASE, BLOOD: LIPASE: 281 U/L (ref 73–393)

## 2014-09-14 LAB — TROPONIN I: Troponin-I: <0.02 ng/mL

## 2014-09-15 ENCOUNTER — Emergency Department: Payer: Self-pay | Admitting: Emergency Medicine

## 2014-10-02 ENCOUNTER — Emergency Department: Payer: Self-pay | Admitting: Emergency Medicine

## 2014-10-08 ENCOUNTER — Emergency Department: Payer: Self-pay | Admitting: Emergency Medicine

## 2014-10-14 ENCOUNTER — Emergency Department: Payer: Self-pay | Admitting: Emergency Medicine

## 2014-10-21 ENCOUNTER — Emergency Department: Payer: Self-pay | Admitting: Emergency Medicine

## 2014-10-23 ENCOUNTER — Emergency Department: Payer: Self-pay | Admitting: Emergency Medicine

## 2014-10-31 ENCOUNTER — Emergency Department: Payer: Self-pay | Admitting: Emergency Medicine

## 2014-11-03 ENCOUNTER — Emergency Department: Payer: Self-pay | Admitting: Emergency Medicine

## 2014-11-11 ENCOUNTER — Emergency Department: Payer: Self-pay | Admitting: Emergency Medicine

## 2014-11-13 ENCOUNTER — Emergency Department: Payer: Self-pay | Admitting: Emergency Medicine

## 2014-12-22 LAB — SURGICAL PATHOLOGY

## 2014-12-29 ENCOUNTER — Emergency Department
Admission: EM | Admit: 2014-12-29 | Discharge: 2014-12-29 | Disposition: A | Discharge status: Home / Self Care | Payer: Self-pay | Attending: Emergency Medicine | Admitting: Emergency Medicine

## 2014-12-29 DIAGNOSIS — Y92410 Unspecified street and highway as the place of occurrence of the external cause: Secondary | ICD-10-CM | POA: Insufficient documentation

## 2014-12-29 DIAGNOSIS — Y9389 Activity, other specified: Secondary | ICD-10-CM | POA: Insufficient documentation

## 2014-12-29 DIAGNOSIS — Y998 Other external cause status: Secondary | ICD-10-CM | POA: Insufficient documentation

## 2014-12-29 DIAGNOSIS — S46811A Strain of other muscles, fascia and tendons at shoulder and upper arm level, right arm, initial encounter: Secondary | ICD-10-CM

## 2014-12-29 DIAGNOSIS — Z720 Tobacco use: Secondary | ICD-10-CM | POA: Insufficient documentation

## 2014-12-29 DIAGNOSIS — J069 Acute upper respiratory infection, unspecified: Secondary | ICD-10-CM

## 2014-12-29 MED ORDER — BENZONATATE 100 MG PO CAPS: 100.0000 mg | ORAL_CAPSULE | Freq: Three times a day (TID) | ORAL | Status: DC | PRN

## 2014-12-29 MED ORDER — CYCLOBENZAPRINE HCL 10 MG PO TABS: 10.0000 mg | ORAL_TABLET | Freq: Three times a day (TID) | ORAL | Status: DC | PRN
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
## 2014-12-31 ENCOUNTER — Emergency Department
Admission: EM | Admit: 2014-12-31 | Discharge: 2014-12-31 | Disposition: A | Discharge status: Home / Self Care | Payer: Self-pay | Attending: Emergency Medicine | Admitting: Emergency Medicine

## 2014-12-31 ENCOUNTER — Emergency Department: Payer: Self-pay

## 2014-12-31 DIAGNOSIS — Y9389 Activity, other specified: Secondary | ICD-10-CM | POA: Insufficient documentation

## 2014-12-31 DIAGNOSIS — Z720 Tobacco use: Secondary | ICD-10-CM | POA: Insufficient documentation

## 2014-12-31 DIAGNOSIS — S0093XA Contusion of unspecified part of head, initial encounter: Secondary | ICD-10-CM | POA: Insufficient documentation

## 2014-12-31 DIAGNOSIS — Z79899 Other long term (current) drug therapy: Secondary | ICD-10-CM | POA: Insufficient documentation

## 2014-12-31 DIAGNOSIS — Y9289 Other specified places as the place of occurrence of the external cause: Secondary | ICD-10-CM | POA: Insufficient documentation

## 2014-12-31 DIAGNOSIS — W208XXA Other cause of strike by thrown, projected or falling object, initial encounter: Secondary | ICD-10-CM | POA: Insufficient documentation

## 2014-12-31 DIAGNOSIS — Y990 Civilian activity done for income or pay: Secondary | ICD-10-CM | POA: Insufficient documentation

## 2014-12-31 NOTE — ED Notes (Signed)
Reports working on car and hood fell down and hit him in head.  No lac, no LOC

## 2014-12-31 NOTE — ED Provider Notes (Signed)
[facility] Emergency Department Provider Note    ____________________________________________  Time seen: 1619  I have reviewed the triage vital signs and the nursing notes.   HISTORY  Chief Complaint Head Injury       HPI [name] is a 34 y.o. male Patient struck of top of head while working on car. States transit episode of vertigo and nausea.  Incident occurred 400 minutes PTA. Denies pain but concern for indention top of scalp. No pallitive or provocative measures.     Past Medical History  Diagnosis Date  . Asthma     There are no active problems to display for this patient.   Past Surgical History  Procedure Laterality Date  . Urethra surgery      Current Outpatient Rx  Name  Route  Sig  Dispense  Refill  . benzonatate (TESSALON PERLES) 100 MG capsule   Oral   Take 1 capsule (100 mg total) by mouth 3 (three) times daily as needed for cough.   30 capsule   0   . cyclobenzaprine (FLEXERIL) 10 MG tablet   Oral   Take 1 tablet (10 mg total) by mouth every 8 (eight) hours as needed for muscle spasms.   30 tablet   0     Allergies Codeine  No family history on file.  Social History History  Substance Use Topics  . Smoking status: Current Every Day Smoker  . Smokeless tobacco: Not on file  . Alcohol Use: Yes    Review of Systems  Constitutional: Negative for fever. Eyes: Negative for visual changes. ENT: Negative for sore throat. Cardiovascular: Negative for chest pain. Respiratory: Negative for shortness of breath. Gastrointestinal: Negative for abdominal pain, vomiting and diarrhea. Genitourinary: Negative for dysuria. Musculoskeletal:Indention top of scalp Skin: Negative for rash. Neurological: Negative for headaches, focal weakness or numbness. Psychiatric:None Endocrine:None Hematological/Lymphatic:None Allergic/Immunilogical: **}  10-point ROS otherwise  negative.  ____________________________________________   PHYSICAL EXAM:  VITAL SIGNS: ED Triage Vitals  Enc Vitals Group     BP 12/31/14 1604 126/81 mmHg     Pulse Rate 12/31/14 1604 91     Resp --      Temp 12/31/14 1604 98.3 F (36.8 C)     Temp Source 12/31/14 1604 Oral     SpO2 12/31/14 1604 99 %     Weight 12/31/14 1604 215 lb (97.523 kg)     Height 12/31/14 1604 5\' 8"  (1.727 m)     Head Cir --      Peak Flow --      Pain Score --      Pain Loc --      Pain Edu? --      Excl. in GC? --      Constitutional: Alert and oriented. Well appearing and in no distress. Eyes: Conjunctivae are normal. PERRL. Normal extraocular movements. ENT   Head: Normocephalic and atraumatic.   Nose: No congestion/rhinnorhea.   Mouth/Throat: Mucous membranes are moist.   Neck: No stridor. Hematological/Lymphatic/Immunilogical: No cervical lymphadenopathy. Cardiovascular: Normal rate, regular rhythm. Normal and symmetric distal pulses are present in all extremities. No murmurs, rubs, or gallops. Respiratory: Normal respiratory effort without tachypnea nor retractions. Breath sounds are clear and equal bilaterally. No wheezes/rales/rhonchi. Gastrointestinal: Soft and nontender. No distention. No abdominal bruits. There is no CVA tenderness. Genitourinary: Not examine Musculoskeletal: Nontender with normal range of motion in all extremities. No joint effusions.  No lower extremity tenderness nor edema. Neurologic:  Normal speech and language.  No gross focal neurologic deficits are appreciated. Speech is normal. No gait instability. Skin:  Skin is warm, dry and intact. No rash noted. Psychiatric: Mood and affect are normal. Speech and behavior are normal. Patient exhibits appropriate insight and judgment.  ____________________________________________    LABS (pertinent  positives/negatives)    ____________________________________________   EKG    ____________________________________________    RADIOLOGY  SkullX-ray negative for fracture  ____________________________________________   PROCEDURES  Procedure(s) performed: None  Critical Care performed: No  ____________________________________________   INITIAL IMPRESSION / ASSESSMENT AND PLAN / ED COURSE  Pertinent labs & imaging results that were available during my care of the patient were reviewed by me and considered in my medical decision making (see chart for details).  Negative Skull fracture  ____________________________________________   FINAL CLINICAL IMPRESSION(S) / ED DIAGNOSES  Final diagnoses:  Head contusion, initial encounter     Joni ReiningRonald K Smith, PA-C 12/31/14 1758  Arelia Longestavid M Schaevitz, MD 01/01/15 0030

## 2014-12-31 NOTE — ED Notes (Signed)
States car hood fell onto his head this pm, no loc, has tenderness to top of head

## 2014-12-31 NOTE — Discharge Instructions (Signed)
Follow head injury sheet and take only Tylenol for headache /pain for next 24 hours.
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
## 2015-01-25 ENCOUNTER — Encounter: Payer: Self-pay | Admitting: Emergency Medicine

## 2015-01-25 ENCOUNTER — Emergency Department
Admission: EM | Admit: 2015-01-25 | Discharge: 2015-01-25 | Disposition: A | Discharge status: Home / Self Care | Payer: Self-pay | Attending: Emergency Medicine | Admitting: Emergency Medicine

## 2015-01-25 DIAGNOSIS — Z720 Tobacco use: Secondary | ICD-10-CM | POA: Insufficient documentation

## 2015-01-25 DIAGNOSIS — Z79899 Other long term (current) drug therapy: Secondary | ICD-10-CM | POA: Insufficient documentation

## 2015-01-25 DIAGNOSIS — R630 Anorexia: Secondary | ICD-10-CM | POA: Insufficient documentation

## 2015-01-25 HISTORY — DX: Anxiety disorder, unspecified: F41.9

## 2015-01-25 LAB — CBC
HEMATOCRIT: 42.7 % (ref 40.0–52.0)
HEMOGLOBIN: 14.2 g/dL (ref 13.0–18.0)
MCH: 29.3 pg (ref 26.0–34.0)
MCHC: 33.2 g/dL (ref 32.0–36.0)
MCV: 88.2 fL (ref 80.0–100.0)
Platelets: 207 10*3/uL (ref 150–440)
RBC: 4.84 MIL/uL (ref 4.40–5.90)
RDW: 13.1 % (ref 11.5–14.5)
WBC: 6.6 10*3/uL (ref 3.8–10.6)

## 2015-01-25 LAB — COMPREHENSIVE METABOLIC PANEL
ALBUMIN: 4.1 g/dL (ref 3.5–5.0)
ALT: 15 U/L — AB (ref 17–63)
ANION GAP: 10 (ref 5–15)
AST: 17 U/L (ref 15–41)
Alkaline Phosphatase: 52 U/L (ref 38–126)
BUN: 7 mg/dL (ref 6–20)
CALCIUM: 8.9 mg/dL (ref 8.9–10.3)
CHLORIDE: 103 mmol/L (ref 101–111)
CO2: 26 mmol/L (ref 22–32)
Creatinine, Ser: 0.82 mg/dL (ref 0.61–1.24)
GFR calc non Af Amer: >60 mL/min (ref >60)
Glucose, Bld: 87 mg/dL (ref 65–99)
Potassium: 3.3 mmol/L — ABNORMAL LOW (ref 3.5–5.1)
Sodium: 139 mmol/L (ref 135–145)
TOTAL PROTEIN: 7.4 g/dL (ref 6.5–8.1)
Total Bilirubin: 0.7 mg/dL (ref 0.3–1.2)

## 2015-01-25 LAB — TSH: TSH: 2.23 u[IU]/mL (ref 0.350–4.500)

## 2015-01-25 NOTE — Discharge Instructions (Signed)
Please seek medical attention for any high fevers, chest pain, shortness of breath, change in behavior, persistent vomiting, bloody stool or any other new or concerning symptoms. Daily Weight Record It is important to weigh yourself daily. Keep this daily weight chart near your scale. Weigh yourself each morning at the same time. Weigh yourself without shoes and with the same amount of clothes each day. Compare today's weight to yesterday's weight. Bring this form with you to your follow-up appointments. Call your caregiver if you gain 03 lb/1.4 kg in 1 day. Call your caregiver if you gain 05 lb/2.3 kg in a week. Date: ________ Weight: ____________________ Date: ________ Weight: ____________________ Date: ________ Weight: ____________________ Date: ________ Weight: ____________________ Date: ________ Weight: ____________________ Date: ________ Weight: ____________________ Date: ________ Weight: ____________________ Date: ________ Weight: ____________________ Date: ________ Weight: ____________________ Date: ________ Weight: ____________________ Date: ________ Weight: ____________________ Date: ________ Weight: ____________________ Date: ________ Weight: ____________________ Date: ________ Weight: ____________________ Date: ________ Weight: ____________________ Date: ________ Weight: ____________________ Date: ________ Weight: ____________________ Date: ________ Weight: ____________________ Date: ________ Weight: ____________________ Date: ________ Weight: ____________________ Date: ________ Weight: ____________________ Date: ________ Weight: ____________________ Date: ________ Weight: ____________________ Date: ________ Weight: ____________________ Date: ________ Weight: ____________________ Date: ________ Weight: ____________________ Date: ________ Weight: ____________________ Date: ________ Weight: ____________________ Date: ________ Weight: ____________________ Date: ________ Weight:  ____________________ Date: ________ Weight: ____________________ Date: ________ Weight: ____________________ Date: ________ Weight: ____________________ Date: ________ Weight: ____________________ Date: ________ Weight: ____________________ Date: ________ Weight: ____________________ Date: ________ Weight: ____________________ Date: ________ Weight: ____________________ Date: ________ Weight: ____________________ Date: ________ Weight: ____________________ Date: ________ Weight: ____________________ Date: ________ Weight: ____________________ Date: ________ Weight: ____________________ Date: ________ Weight: ____________________ Date: ________ Weight: ____________________ Date: ________ Weight: ____________________ Date: ________ Weight: ____________________ Date: ________ Weight: ____________________ Date: ________ Weight: ____________________ Date: ________ Weight: ____________________ Document Released: 10/27/2006 Document Revised: 11/07/2011 Document Reviewed: 08/03/2007 [employer] Patient Information 2015 [employer]. This information is not intended to replace advice given to you by your health care provider. Make sure you discuss any questions you have with your health care provider.

## 2015-01-25 NOTE — ED Notes (Signed)
Pt informed to return if any life threatening symptoms occur.  

## 2015-01-25 NOTE — ED Provider Notes (Signed)
[facility] Emergency Department Provider Note    ____________________________________________  Time seen: 0930  I have reviewed the triage vital signs and the nursing notes.   HISTORY  Chief Complaint Weight Loss   History limited by: Not Limited   HPI Douglas Singh is a 34 y.o. male he presents to the emergency because of concerns for weight loss. He states he has been losing weight for the past 4-5 months. He thinks that this may be related to him starting clonazepam. He states he just no longer has an appetite and is only eating roughly once a day. He states that he came in today because he had a little bit of abdominal discomfort this morning. He has not noticed any change to his bowel movements, no blood in his bowel movements. No vomiting. No fevers.     Past Medical History  Diagnosis Date  . Asthma   . Anxiety     There are no active problems to display for this patient.   Past Surgical History  Procedure Laterality Date  . Urethra surgery      Current Outpatient Rx  Name  Route  Sig  Dispense  Refill  . albuterol (PROVENTIL HFA;VENTOLIN HFA) 108 (90 BASE) MCG/ACT inhaler   Inhalation   Inhale 2 puffs into the lungs every 6 (six) hours as needed. For wheezing.         . clonazePAM (KLONOPIN) 0.5 MG tablet   Oral   Take 0.5 mg by mouth 3 (three) times daily.      0   . benzonatate (TESSALON PERLES) 100 MG capsule   Oral   Take 1 capsule (100 mg total) by mouth 3 (three) times daily as needed for cough.   30 capsule   0   . cyclobenzaprine (FLEXERIL) 10 MG tablet   Oral   Take 1 tablet (10 mg total) by mouth every 8 (eight) hours as needed for muscle spasms.   30 tablet   0     Allergies Codeine  No family history on file.  Social History History  Substance Use Topics  . Smoking status: Current Every Day Smoker -- 2.00 packs/day    Types: Cigarettes  . Smokeless tobacco: Not on file  . Alcohol Use: No     Review of Systems  Constitutional: Negative for fever. Cardiovascular: Negative for chest pain. Respiratory: Negative for shortness of breath. Gastrointestinal: Negative for abdominal pain, vomiting and diarrhea. Genitourinary: Negative for dysuria. Musculoskeletal: Negative for back pain. Skin: Negative for rash. Neurological: Negative for headaches, focal weakness or numbness.  10-point ROS otherwise negative.  ____________________________________________   PHYSICAL EXAM:  VITAL SIGNS: ED Triage Vitals  Enc Vitals Group     BP 01/25/15 0858 130/83 mmHg     Pulse Rate 01/25/15 0858 65     Resp 01/25/15 0858 20     Temp 01/25/15 0858 98.1 F (36.7 C)     Temp Source 01/25/15 0858 Oral     SpO2 01/25/15 0858 100 %     Weight 01/25/15 0858 206 lb (93.441 kg)     Height 01/25/15 0858 5\' 8"  (1.727 m)     Head Cir --      Peak Flow --      Pain Score 01/25/15 0904 0   Constitutional: Alert and oriented. Well appearing and in no distress. Eyes: Conjunctivae are normal. PERRL. Normal extraocular movements. ENT   Head: Normocephalic and atraumatic.   Nose: No congestion/rhinnorhea.   Mouth/Throat:  Mucous membranes are moist.   Neck: No stridor. Hematological/Lymphatic/Immunilogical: No cervical lymphadenopathy. Cardiovascular: Normal rate, regular rhythm.  No murmurs, rubs, or gallops. Respiratory: Normal respiratory effort without tachypnea nor retractions. Breath sounds are clear and equal bilaterally. No wheezes/rales/rhonchi. Gastrointestinal: Soft and nontender. No distention.  Genitourinary: Deferred Musculoskeletal: Normal range of motion in all extremities. No joint effusions.  No lower extremity tenderness nor edema. Neurologic:  Normal speech and language. No gross focal neurologic deficits are appreciated. Speech is normal.  Skin:  Skin is warm, dry and intact. No rash noted. Psychiatric: Mood and affect are normal. Speech and behavior are  normal. Patient exhibits appropriate insight and judgment.  ____________________________________________    LABS (pertinent positives/negatives)  Labs Reviewed  COMPREHENSIVE METABOLIC PANEL - Abnormal; Notable for the following:    Potassium 3.3 (*)    ALT 15 (*)    All other components within normal limits  CBC  TSH    ____________________________________________   EKG  None  ____________________________________________    RADIOLOGY  None  ____________________________________________   PROCEDURES  Procedure(s) performed: None  Critical Care performed: No  ____________________________________________   INITIAL IMPRESSION / ASSESSMENT AND PLAN / ED COURSE  Pertinent labs & imaging results that were available during my care of the patient were reviewed by me and considered in my medical decision making (see chart for details).  Patient here for concerns for weight loss. Will check basic labs and a TSH.  Blood work without concerning findings. Discussed with patient importance of establishing care with primary care physician. ____________________________________________   FINAL CLINICAL IMPRESSION(S) / ED DIAGNOSES  Final diagnoses:  Decreased appetite     Phineas Semen, MD 01/26/15 1031

## 2015-01-25 NOTE — ED Notes (Signed)
Pt reports for past couple of months he has been losing weight and decreased appetite. Pt has lost 30 lbs in one month. Pt reports he has been taking a new medication for anxiety Clonazapam for past couple of months. 0.5mg  TID or PRN

## 2015-01-31 ENCOUNTER — Emergency Department
Admission: EM | Admit: 2015-01-31 | Discharge: 2015-01-31 | Disposition: A | Discharge status: Home / Self Care | Payer: Self-pay | Attending: Emergency Medicine | Admitting: Emergency Medicine

## 2015-01-31 DIAGNOSIS — J45901 Unspecified asthma with (acute) exacerbation: Secondary | ICD-10-CM | POA: Insufficient documentation

## 2015-01-31 DIAGNOSIS — Z79899 Other long term (current) drug therapy: Secondary | ICD-10-CM | POA: Insufficient documentation

## 2015-01-31 DIAGNOSIS — R0602 Shortness of breath: Secondary | ICD-10-CM

## 2015-01-31 DIAGNOSIS — Z87891 Personal history of nicotine dependence: Secondary | ICD-10-CM | POA: Insufficient documentation

## 2015-01-31 NOTE — Discharge Instructions (Signed)

## 2015-01-31 NOTE — ED Notes (Signed)
Patient reports taking klonopin as needed for panic disorder.  He reports not taking klonopin 01-30-15 because he had planned to go out to the club and drink alcohol.  Patient keeps verbalizing "I do not mix the two together.  I always skip my klonopin for about 24 hours when I plan to drink".  Patient reports taking dose of klonopin at 3 pm and about 35 minutes after klonopin dose patient reports shortness of breath and a "funny feeling in his throat".  Patients reports that he has been on klonopin for 5 months without any complications.

## 2015-01-31 NOTE — ED Notes (Signed)
Patient left without instructions.

## 2015-01-31 NOTE — ED Provider Notes (Signed)
[facility] Emergency Department Provider Note ____________________________________________  Time seen: 1750  I have reviewed the triage vital signs and the nursing notes.  HISTORY  Chief Complaint Shortness of Breath  HPI Unk Douglas Singh is a 34 y.o. male who is well-known to the ED, who presents today with a fleeting sensation of shortness of breath earlier today. He describes the onset about 30-40 minutes after he dosed his Klonopin today at about 3 PM. He describes a sensation of fullness localized to his throat. He denies cough, congestion, difficulty swallowing, nausea or vomiting. He describes the sensation has now resolved, and wonders if he could've been related to excessive heat and humidity outside today. He does also give a history of some heartburn or reflux. He is here otherwise without complaint.  Past Medical History  Diagnosis Date  . Asthma   . Anxiety    There are no active problems to display for this patient.  Past Surgical History  Procedure Laterality Date  . Urethra surgery      Current Outpatient Rx  Name  Route  Sig  Dispense  Refill  . albuterol (PROVENTIL HFA;VENTOLIN HFA) 108 (90 BASE) MCG/ACT inhaler   Inhalation   Inhale 2 puffs into the lungs every 6 (six) hours as needed. For wheezing.         . benzonatate (TESSALON PERLES) 100 MG capsule   Oral   Take 1 capsule (100 mg total) by mouth 3 (three) times daily as needed for cough.   30 capsule   0   . clonazePAM (KLONOPIN) 0.5 MG tablet   Oral   Take 0.5 mg by mouth 3 (three) times daily.      0   . cyclobenzaprine (FLEXERIL) 10 MG tablet   Oral   Take 1 tablet (10 mg total) by mouth every 8 (eight) hours as needed for muscle spasms.   30 tablet   0    Allergies Codeine  History reviewed. No pertinent family history.  Social History History  Substance Use Topics  . Smoking status: Former Smoker -- 0.00 packs/day  . Smokeless tobacco: Never Used  .  Alcohol Use: Yes   Review of Systems  Constitutional: Negative for fever. Eyes: Negative for visual changes. ENT: Negative for sore throat. Cardiovascular: Negative for chest pain. Respiratory: Positive for shortness of breath. Gastrointestinal: Negative for abdominal pain, vomiting and diarrhea. Genitourinary: Negative for dysuria. Musculoskeletal: Negative for back pain. Skin: Negative for rash. Neurological: Negative for headaches, focal weakness or numbness. ___________________________________________  PHYSICAL EXAM:  VITAL SIGNS: ED Triage Vitals  Enc Vitals Group     BP 01/31/15 1636 132/71 mmHg     Pulse Rate 01/31/15 1636 74     Resp 01/31/15 1636 16     Temp 01/31/15 1636 98.7 F (37.1 C)     Temp Source 01/31/15 1636 Oral     SpO2 01/31/15 1636 98 %     Weight 01/31/15 1636 215 lb (97.523 kg)     Height 01/31/15 1636 5\' 8"  (1.727 m)     Head Cir --      Peak Flow --      Pain Score --      Pain Loc --      Pain Edu? --      Excl. in GC? --    Constitutional: Alert and oriented. Well appearing and in no distress. VSS without decreased oxygen saturation. Patient able to speak on full sentences without difficulty. Eyes: Conjunctivae are  normal. PERRL. Normal extraocular movements. ENT   Head: Normocephalic and atraumatic.   Nose: No congestion/rhinnorhea.   Mouth/Throat: Mucous membranes are moist. Tonsils are not enlarged. Uvula is midline without edema.    Neck: Supple. Trachea midline. Hematological/Lymphatic/Immunilogical: No cervical lymphadenopathy. Cardiovascular: Normal rate, regular rhythm.  Respiratory: Normal respiratory effort. No wheezes/rales/rhonchi. Gastrointestinal: Soft and nontender. No distention. Musculoskeletal: Nontender with normal range of motion in all extremities.No lower extremity tenderness nor edema. Neurologic:  Normal speech and language. No gross focal neurologic deficits are appreciated. Skin:  Skin is warm, dry  and intact. No rash noted. Psychiatric: Mood and affect are normal. Patient exhibits appropriate insight and judgment. ___________________________________________  INITIAL IMPRESSION / ASSESSMENT AND PLAN / ED COURSE  Fleeting episode of shortness of breath today. Reassurance to patient about normal vital signs and exam without respiratory distress.  ____________________________________________  FINAL CLINICAL IMPRESSION(S) / ED DIAGNOSES  Final diagnoses:  Shortness of breath     Lissa Hoard, PA-C 01/31/15 1845  Sharyn Creamer, MD 01/31/15 2152

## 2015-02-04 ENCOUNTER — Emergency Department: Payer: Self-pay

## 2015-02-04 ENCOUNTER — Emergency Department
Admission: EM | Admit: 2015-02-04 | Discharge: 2015-02-04 | Disposition: A | Discharge status: Home / Self Care | Payer: Self-pay | Attending: Emergency Medicine | Admitting: Emergency Medicine

## 2015-02-04 ENCOUNTER — Other Ambulatory Visit: Payer: Self-pay

## 2015-02-04 ENCOUNTER — Encounter: Payer: Self-pay | Admitting: Emergency Medicine

## 2015-02-04 DIAGNOSIS — Z720 Tobacco use: Secondary | ICD-10-CM | POA: Insufficient documentation

## 2015-02-04 DIAGNOSIS — J45901 Unspecified asthma with (acute) exacerbation: Secondary | ICD-10-CM | POA: Insufficient documentation

## 2015-02-04 DIAGNOSIS — R002 Palpitations: Secondary | ICD-10-CM | POA: Insufficient documentation

## 2015-02-04 DIAGNOSIS — Z79899 Other long term (current) drug therapy: Secondary | ICD-10-CM | POA: Insufficient documentation

## 2015-02-04 DIAGNOSIS — F410 Panic disorder [episodic paroxysmal anxiety] without agoraphobia: Secondary | ICD-10-CM

## 2015-02-04 DIAGNOSIS — F419 Anxiety disorder, unspecified: Secondary | ICD-10-CM | POA: Insufficient documentation

## 2015-02-04 NOTE — ED Provider Notes (Signed)
[facility] Emergency Department Provider Note  ____________________________________________  Time seen: 4:00 PM  I have reviewed the triage vital signs and the nursing notes.   HISTORY  Chief Complaint Chest Pain    HPI Douglas Singh is a 34 y.o. male who complains of shortness of breath chest pain and chest tightness and dizziness and palpitations starting at 2:30 PM today. He normally takes Klonopin 0.5 mg 3 times a day, but he has not taken it for over 24 hours because he planned on drinking today and he doesn't like to mix alcohol with his benzodiazepines. He had 2 shots of liquor around noon today. The symptoms lasted 30 minutes and then resolve spontaneously. Currently he feels like he is at his baseline and denies headache vision changes chest pain shortness of breath abdominal pain back pain nausea vomiting diarrhea tremulousness seizures or hallucinations..     Past Medical History  Diagnosis Date  . Asthma   . Anxiety     There are no active problems to display for this patient.   Past Surgical History  Procedure Laterality Date  . Urethra surgery      Current Outpatient Rx  Name  Route  Sig  Dispense  Refill  . albuterol (PROVENTIL HFA;VENTOLIN HFA) 108 (90 BASE) MCG/ACT inhaler   Inhalation   Inhale 2 puffs into the lungs every 6 (six) hours as needed. For wheezing.         . benzonatate (TESSALON PERLES) 100 MG capsule   Oral   Take 1 capsule (100 mg total) by mouth 3 (three) times daily as needed for cough.   30 capsule   0   . clonazePAM (KLONOPIN) 0.5 MG tablet   Oral   Take 0.5 mg by mouth 3 (three) times daily.      0   . cyclobenzaprine (FLEXERIL) 10 MG tablet   Oral   Take 1 tablet (10 mg total) by mouth every 8 (eight) hours as needed for muscle spasms.   30 tablet   0     Allergies Codeine  No family history on file.  Social History History  Substance Use Topics  . Smoking status: Current Every Day  Smoker -- 0.00 packs/day  . Smokeless tobacco: Never Used  . Alcohol Use: Yes    Review of Systems  Constitutional: No fever or chills. No weight changes Eyes:No blurry vision or double vision.  ENT: No sore throat. Cardiovascular: Chest pain as above Respiratory: Dyspnea as above. Gastrointestinal: Negative for abdominal pain, vomiting and diarrhea.  No BRBPR or melena. Genitourinary: Negative for dysuria, urinary retention, bloody urine, or difficulty urinating. Musculoskeletal: Negative for back pain. No joint swelling or pain. Skin: Negative for rash. Neurological: Negative for headaches, focal weakness or numbness. Psychiatric:Anxiety.   Endocrine:No hot/cold intolerance, changes in energy, or sleep difficulty.  10-point ROS otherwise negative.  ____________________________________________   PHYSICAL EXAM:  VITAL SIGNS: ED Triage Vitals  Enc Vitals Group     BP 02/04/15 1530 125/73 mmHg     Pulse Rate 02/04/15 1530 75     Resp 02/04/15 1530 20     Temp 02/04/15 1530 98.2 F (36.8 C)     Temp Source 02/04/15 1530 Oral     SpO2 02/04/15 1530 98 %     Weight 02/04/15 1530 205 lb (92.987 kg)     Height 02/04/15 1530 5\' 8"  (1.727 m)     Head Cir --      Peak Flow --  Pain Score 02/04/15 1533 0     Pain Loc --      Pain Edu? --      Excl. in GC? --      Constitutional: Alert and oriented. Well appearing and in no distress. Eyes: No scleral icterus. No conjunctival pallor. PERRL. EOMI ENT   Head: Normocephalic and atraumatic.   Nose: No congestion/rhinnorhea. No septal hematoma   Mouth/Throat: MMM, no pharyngeal erythema. No peritonsillar mass. No uvula shift.   Neck: No stridor. No SubQ emphysema. No meningismus. Hematological/Lymphatic/Immunilogical: No cervical lymphadenopathy. Cardiovascular: RRR. Normal and symmetric distal pulses are present in all extremities. No murmurs, rubs, or gallops. Respiratory: Normal respiratory effort without  tachypnea nor retractions. Breath sounds are clear and equal bilaterally. No wheezes/rales/rhonchi. Chest wall nontender Gastrointestinal: Soft and nontender. No distention. There is no CVA tenderness.  No rebound, rigidity, or guarding. Genitourinary: deferred Musculoskeletal: Nontender with normal range of motion in all extremities. No joint effusions.  No lower extremity tenderness.  No edema. Neurologic:   Normal speech and language.  CN 2-10 normal. Motor grossly intact. No pronator drift.  Normal gait. No gross focal neurologic deficits are appreciated.  Skin:  Skin is warm, dry and intact. No rash noted.  No petechiae, purpura, or bullae. Psychiatric: Mood and affect are normal. Speech and behavior are normal. Patient exhibits appropriate insight and judgment.  ____________________________________________    LABS (pertinent positives/negatives) (all labs ordered are listed, but only abnormal results are displayed) Labs Reviewed - No data to display ____________________________________________   EKG  Interpreted by me  Date: 02/04/2015  Rate: 71  Rhythm: normal sinus rhythm  QRS Axis: normal  Intervals: normal  ST/T Wave abnormalities: normal  Conduction Disutrbances: none  Narrative Interpretation: unremarkable      ____________________________________________    RADIOLOGY  Chest x-ray unremarkable  ____________________________________________   PROCEDURES  ____________________________________________   INITIAL IMPRESSION / ASSESSMENT AND PLAN / ED COURSE  Pertinent labs & imaging results that were available during my care of the patient were reviewed by me and considered in my medical decision making (see chart for details).  Patient presents with an anxiety attack and possibly a very mild subclinical benzo withdrawal syndrome due to being off of his benzodiazepine for 24 hours that he takes chronically. He is now feeling back to normal and has normal  vital signs. There is no tremor or hallucinosis or other acute finding. We'll discharge him home and I advised the patient to take his Klonopin when he gets home. He is otherwise medically and psychiatrically stable at this time.  ____________________________________________   FINAL CLINICAL IMPRESSION(S) / ED DIAGNOSES  Final diagnoses:  Anxiety attack      Sharman Cheek, MD 02/04/15 1635

## 2015-02-04 NOTE — Discharge Instructions (Signed)

## 2015-02-04 NOTE — ED Notes (Addendum)
Pt reports SOB that started about 1 hour ago. Pt reports Chest Pain 1 hour ago with a flutter feeling to heart. Pt also reports dizziness. Pt reports a history of Panic disorders.

## 2015-02-06 ENCOUNTER — Encounter: Payer: Self-pay | Admitting: Emergency Medicine

## 2015-02-06 ENCOUNTER — Emergency Department
Admission: EM | Admit: 2015-02-06 | Discharge: 2015-02-06 | Disposition: A | Discharge status: Home / Self Care | Payer: Self-pay | Attending: Emergency Medicine | Admitting: Emergency Medicine

## 2015-02-06 DIAGNOSIS — Y998 Other external cause status: Secondary | ICD-10-CM | POA: Insufficient documentation

## 2015-02-06 DIAGNOSIS — X58XXXA Exposure to other specified factors, initial encounter: Secondary | ICD-10-CM | POA: Insufficient documentation

## 2015-02-06 DIAGNOSIS — K219 Gastro-esophageal reflux disease without esophagitis: Secondary | ICD-10-CM | POA: Insufficient documentation

## 2015-02-06 DIAGNOSIS — Z720 Tobacco use: Secondary | ICD-10-CM | POA: Insufficient documentation

## 2015-02-06 DIAGNOSIS — S29012A Strain of muscle and tendon of back wall of thorax, initial encounter: Secondary | ICD-10-CM | POA: Insufficient documentation

## 2015-02-06 DIAGNOSIS — Z79899 Other long term (current) drug therapy: Secondary | ICD-10-CM | POA: Insufficient documentation

## 2015-02-06 DIAGNOSIS — Z8719 Personal history of other diseases of the digestive system: Secondary | ICD-10-CM

## 2015-02-06 DIAGNOSIS — S39011A Strain of muscle, fascia and tendon of abdomen, initial encounter: Secondary | ICD-10-CM | POA: Insufficient documentation

## 2015-02-06 DIAGNOSIS — Y9289 Other specified places as the place of occurrence of the external cause: Secondary | ICD-10-CM | POA: Insufficient documentation

## 2015-02-06 DIAGNOSIS — Y9389 Activity, other specified: Secondary | ICD-10-CM | POA: Insufficient documentation

## 2015-02-06 MED ORDER — OMEPRAZOLE 20 MG PO CPDR: 20.0000 mg | DELAYED_RELEASE_CAPSULE | Freq: Two times a day (BID) | ORAL | Status: DC

## 2015-02-06 MED ORDER — IBUPROFEN 800 MG PO TABS: 800.0000 mg | ORAL_TABLET | Freq: Three times a day (TID) | ORAL | Status: DC | PRN

## 2015-02-06 MED ORDER — METHOCARBAMOL 500 MG PO TABS: 500.0000 mg | ORAL_TABLET | Freq: Four times a day (QID) | ORAL | Status: DC | PRN

## 2015-02-06 MED ORDER — GI COCKTAIL ~~LOC~~
30.0000 mL | Freq: Once | ORAL | Status: AC
  Administered 2015-02-06: 30 mL via ORAL
  Filled 2015-02-06: qty 30

## 2015-02-06 NOTE — ED Notes (Signed)
Pt to ed with c/o chest pain and sob today.  Pt was seen here earlier today for back pain.  Reports he does not have a PMD.  Pt states he feels sob and like he can't get a good breath.  Pt sats 98% pt appears in no acute resp distress.   Pt able to speak in full complete sentences.  Equal chest rise and fall, pt states he drank alcohol last night and is worried about taking his clonazepam.

## 2015-02-06 NOTE — ED Notes (Signed)
Pt states pain in is upper back on the left side. The pain started this morning and it gets worse with movement. Pt has some throat irritation but is breathing fine. Hx of asthma states took albuterol  inhaler about 30 mins ago when he had some shortness of breath. States last night he had some acid reflux and felt tightness in his abdomen. Pt has hx of panic attacks.

## 2015-02-06 NOTE — ED Notes (Signed)
Pt alert and oriented X4, active, cooperative, pt in NAD. RR even and unlabored, color WNL.  Pt informed to return if any life threatening symptoms occur.   

## 2015-02-06 NOTE — ED Provider Notes (Signed)
[facility] Emergency Department Provider Note  ____________________________________________  Time seen: Approximately 9:33 AM  I have reviewed the triage vital signs and the nursing notes.   HISTORY  Chief Complaint Back Pain    HPI Douglas Singh is a 34 y.o. male resents for evaluation of left lateral back pain times one day. Patient reports waking up this morning feeling like he pulled a muscle. Denies any chest pain or shortness of breath. Patient states that the pain is worse with movement and better with rest.   Past Medical History  Diagnosis Date  . Asthma   . Anxiety     There are no active problems to display for this patient.   Past Surgical History  Procedure Laterality Date  . Urethra surgery      Current Outpatient Rx  Name  Route  Sig  Dispense  Refill  . albuterol (PROVENTIL HFA;VENTOLIN HFA) 108 (90 BASE) MCG/ACT inhaler   Inhalation   Inhale 2 puffs into the lungs every 6 (six) hours as needed. For wheezing.         . benzonatate (TESSALON PERLES) 100 MG capsule   Oral   Take 1 capsule (100 mg total) by mouth 3 (three) times daily as needed for cough.   30 capsule   0   . clonazePAM (KLONOPIN) 0.5 MG tablet   Oral   Take 0.5 mg by mouth 3 (three) times daily.      0   . cyclobenzaprine (FLEXERIL) 10 MG tablet   Oral   Take 1 tablet (10 mg total) by mouth every 8 (eight) hours as needed for muscle spasms.   30 tablet   0   . ibuprofen (ADVIL,MOTRIN) 800 MG tablet   Oral   Take 1 tablet (800 mg total) by mouth every 8 (eight) hours as needed.   30 tablet   0   . methocarbamol (ROBAXIN) 500 MG tablet   Oral   Take 1 tablet (500 mg total) by mouth every 6 (six) hours as needed for muscle spasms.   30 tablet   0     Allergies Codeine  No family history on file.  Social History History  Substance Use Topics  . Smoking status: Current Every Day Smoker -- 0.00 packs/day  . Smokeless tobacco: Never Used   . Alcohol Use: Yes    Review of Systems Constitutional: No fever/chills Eyes: No visual changes. ENT: No sore throat. Cardiovascular: Denies chest pain. Respiratory: Denies shortness of breath. Gastrointestinal: No abdominal pain.  No nausea, no vomiting.  No diarrhea.  No constipation. Genitourinary: Negative for dysuria. Musculoskeletal: Positive for left lateral flank muscle strain. Skin: Negative for rash. Neurological: Negative for headaches, focal weakness or numbness.  10-point ROS otherwise negative.  ____________________________________________   PHYSICAL EXAM:  VITAL SIGNS: ED Triage Vitals  Enc Vitals Group     BP --      Pulse --      Resp --      Temp --      Temp src --      SpO2 --      Weight --      Height --      Head Cir --      Peak Flow --      Pain Score --      Pain Loc --      Pain Edu? --      Excl. in GC? --     Constitutional: Alert  and oriented. Well appearing and in no acute distress. Eyes: Conjunctivae are normal. PERRL. EOMI. Head: Atraumatic. Nose: No congestion/rhinnorhea. Mouth/Throat: Mucous membranes are moist.  Oropharynx non-erythematous. Neck: No stridor.   Cardiovascular: Normal rate, regular rhythm. Grossly normal heart sounds.  Good peripheral circulation. Respiratory: Normal respiratory effort.  No retractions. Lungs CTAB. Gastrointestinal: Soft and nontender. No distention. No abdominal bruits. No CVA tenderness. Musculoskeletal: Left thoracic paraspinal dorsalis muscle tenderness. No ecchymosis, no rash,. Neurologic:  Normal speech and language. No gross focal neurologic deficits are appreciated. Speech is normal. No gait instability. Skin:  Skin is warm, dry and intact. No rash noted. Psychiatric: Mood and affect are normal. Speech and behavior are normal.  ____________________________________________   LABS (all labs ordered are listed, but only abnormal results are displayed)  Labs Reviewed - No data to  display ____________________________________________  EKG  Deferred  ____________________________________________  RADIOLOGY  Deferred  ____________________________________________   PROCEDURES  Procedure(s) performed: None  Critical Care performed: No  ____________________________________________   INITIAL IMPRESSION / ASSESSMENT AND PLAN / ED COURSE  Pertinent labs & imaging results that were available during my care of the patient were reviewed by me and considered in my medical decision making (see chart for details).  Muscle strain left flank. Rx given for Robaxin and Motrin. Patient voices no other emergency medical complaints at this visit. All with worsening symptomology.  ____________________________________________   FINAL CLINICAL IMPRESSION(S) / ED DIAGNOSES  Final diagnoses:  Muscle strain of left upper back, initial encounter      Evangeline Dakin, PA-C 02/06/15 4098  Sharyn Creamer, MD 02/08/15 1512

## 2015-02-06 NOTE — ED Notes (Signed)
C/o some mid to epigastric chest pain past few days, some tightness in throat

## 2015-02-06 NOTE — ED Notes (Signed)
Back pain that began this AM.

## 2015-02-06 NOTE — Discharge Instructions (Signed)

## 2015-02-06 NOTE — ED Provider Notes (Signed)
[facility] Emergency Department Provider Note  ____________________________________________  Time seen: Approximately 2:24 PM  I have reviewed the triage vital signs and the nursing notes.   HISTORY  Chief Complaint Chest Pain    HPI Douglas Singh is a 34 y.o. male who returns to the ER after visit 2 hours earlier with complaints of heartburn indigestion. Patient states he felt like a tightness or a knot in his upper abdomen. Nausea vomiting no radiation of pain no arm pain felt some tingling and felt some shortness of breath. This is the patient's fifth visit in 12 days.Took his Klonopin prior to arrival.   Past Medical History  Diagnosis Date  . Asthma   . Anxiety     There are no active problems to display for this patient.   Past Surgical History  Procedure Laterality Date  . Urethra surgery      Current Outpatient Rx  Name  Route  Sig  Dispense  Refill  . albuterol (PROVENTIL HFA;VENTOLIN HFA) 108 (90 BASE) MCG/ACT inhaler   Inhalation   Inhale 2 puffs into the lungs every 6 (six) hours as needed. For wheezing.         . clonazePAM (KLONOPIN) 0.5 MG tablet   Oral   Take 0.5 mg by mouth 3 (three) times daily.      0   . ibuprofen (ADVIL,MOTRIN) 800 MG tablet   Oral   Take 1 tablet (800 mg total) by mouth every 8 (eight) hours as needed.   30 tablet   0   . methocarbamol (ROBAXIN) 500 MG tablet   Oral   Take 1 tablet (500 mg total) by mouth every 6 (six) hours as needed for muscle spasms.   30 tablet   0   . omeprazole (PRILOSEC) 20 MG capsule   Oral   Take 1 capsule (20 mg total) by mouth 2 (two) times daily before a meal.   30 capsule   0     Allergies Codeine  History reviewed. No pertinent family history.  Social History History  Substance Use Topics  . Smoking status: Current Every Day Smoker -- 0.00 packs/day  . Smokeless tobacco: Never Used  . Alcohol Use: Yes    Review of Systems Constitutional: No  fever/chills Eyes: No visual changes. ENT: No sore throat. Cardiovascular: Denies chest pain. Respiratory: Denies shortness of breath. Gastrointestinal: Positive abdominal pain, epigastric.  No nausea, no vomiting.  No diarrhea.  No constipation. Genitourinary: Negative for dysuria. Musculoskeletal: Negative for back pain. Skin: Negative for rash. Neurological: Negative for headaches, focal weakness or numbness.  10-point ROS otherwise negative.  ____________________________________________   PHYSICAL EXAM:  VITAL SIGNS: ED Triage Vitals  Enc Vitals Group     BP 02/06/15 1329 128/82 mmHg     Pulse Rate 02/06/15 1329 80     Resp 02/06/15 1329 18     Temp 02/06/15 1329 97.5 F (36.4 C)     Temp Source 02/06/15 1329 Oral     SpO2 02/06/15 1329 100 %     Weight 02/06/15 1329 205 lb (92.987 kg)     Height 02/06/15 1329  (1.727 m)     Head Cir --      Peak Flow --      Pain Score 02/06/15 1330 0     Pain Loc --      Pain Edu? --      Excl. in GC? --     Constitutional: Alert and oriented.  Well appearing and in no acute distress. Eyes: Conjunctivae are normal. PERRL. EOMI. Head: Atraumatic. Nose: No congestion/rhinnorhea. Mouth/Throat: Mucous membranes are moist.  Oropharynx non-erythematous. Neck: No stridor.   Cardiovascular: Normal rate, regular rhythm. Grossly normal heart sounds.  Good peripheral circulation. Respiratory: Normal respiratory effort.  No retractions. Lungs CTAB. Gastrointestinal: Soft and nontender. No distention. No abdominal bruits. No CVA tenderness. Musculoskeletal: No lower extremity tenderness nor edema.  No joint effusions. Neurologic:  Normal speech and language. No gross focal neurologic deficits are appreciated. Speech is normal. No gait instability. Skin:  Skin is warm, dry and intact. No rash noted. Psychiatric: Mood and affect are normal. Speech and behavior are normal.  ____________________________________________   LABS (all labs  ordered are listed, but only abnormal results are displayed)  Labs Reviewed - No data to display ____________________________________________  ____________________________________________   PROCEDURES  Procedure(s) performed: None  Critical Care performed: No  ____________________________________________   INITIAL IMPRESSION / ASSESSMENT AND PLAN / ED COURSE  Pertinent labs & imaging results that were available during my care of the patient were reviewed by me and considered in my medical decision making (see chart for details).  GI cocktail given to patient on arrival. 30 minutes later patient states much improvement with no symptoms at all. Plan to discharge patient home again, and to return to the ER if any worsening symptomology or new onset symptoms. Again, patient states no other emergency medical complaints at this time. ____________________________________________   FINAL CLINICAL IMPRESSION(S) / ED DIAGNOSES  Final diagnoses:  H/O gastroesophageal reflux (GERD)      Evangeline Dakin, PA-C 02/06/15 1541  Sharyn Creamer, MD 02/06/15 1620

## 2015-02-06 NOTE — ED Notes (Signed)
Pt was seen here earlier today for arm pain

## 2015-02-18 ENCOUNTER — Encounter: Payer: Self-pay | Admitting: Urgent Care

## 2015-02-18 DIAGNOSIS — S80861A Insect bite (nonvenomous), right lower leg, initial encounter: Secondary | ICD-10-CM | POA: Insufficient documentation

## 2015-02-18 DIAGNOSIS — Y9389 Activity, other specified: Secondary | ICD-10-CM | POA: Insufficient documentation

## 2015-02-18 DIAGNOSIS — W57XXXA Bitten or stung by nonvenomous insect and other nonvenomous arthropods, initial encounter: Secondary | ICD-10-CM | POA: Insufficient documentation

## 2015-02-18 DIAGNOSIS — Y998 Other external cause status: Secondary | ICD-10-CM | POA: Insufficient documentation

## 2015-02-18 DIAGNOSIS — Y9289 Other specified places as the place of occurrence of the external cause: Secondary | ICD-10-CM | POA: Insufficient documentation

## 2015-02-18 DIAGNOSIS — S30812A Abrasion of penis, initial encounter: Secondary | ICD-10-CM | POA: Insufficient documentation

## 2015-02-18 DIAGNOSIS — Z720 Tobacco use: Secondary | ICD-10-CM | POA: Insufficient documentation

## 2015-02-18 DIAGNOSIS — Z79899 Other long term (current) drug therapy: Secondary | ICD-10-CM | POA: Insufficient documentation

## 2015-02-18 DIAGNOSIS — S80862A Insect bite (nonvenomous), left lower leg, initial encounter: Secondary | ICD-10-CM | POA: Insufficient documentation

## 2015-02-18 NOTE — ED Notes (Signed)
Patient presents with a non-specific rash to his BLE. Also reporting "a spot under his junk" that has been there for a week. Patient reports that the sub-scrotal area is flat and not like an abscess - states, "they aint no pus or anything in it." Patient states, "This is a fast track problem. I need to go over there."

## 2015-02-19 ENCOUNTER — Emergency Department
Admission: EM | Admit: 2015-02-19 | Discharge: 2015-02-19 | Disposition: A | Discharge status: Home / Self Care | Payer: Self-pay | Attending: Emergency Medicine | Admitting: Emergency Medicine

## 2015-02-19 DIAGNOSIS — T148XXA Other injury of unspecified body region, initial encounter: Secondary | ICD-10-CM

## 2015-02-19 DIAGNOSIS — W57XXXA Bitten or stung by nonvenomous insect and other nonvenomous arthropods, initial encounter: Secondary | ICD-10-CM

## 2015-02-19 NOTE — Discharge Instructions (Signed)
Abrasion An abrasion is a cut or scrape of the skin. Abrasions do not extend through all layers of the skin and most heal within 10 days. It is important to care for your abrasion properly to prevent infection. CAUSES  Most abrasions are caused by falling on, or gliding across, the ground or other surface. When your skin rubs on something, the outer and inner layer of skin rubs off, causing an abrasion. DIAGNOSIS  Your caregiver will be able to diagnose an abrasion during a physical exam.  TREATMENT  Your treatment depends on how large and deep the abrasion is. Generally, your abrasion will be cleaned with water and a mild soap to remove any dirt or debris. An antibiotic ointment may be put over the abrasion to prevent an infection. A bandage (dressing) may be wrapped around the abrasion to keep it from getting dirty.  You may need a tetanus shot if:  You cannot remember when you had your last tetanus shot.  You have never had a tetanus shot.  The injury broke your skin. If you get a tetanus shot, your arm may swell, get red, and feel warm to the touch. This is common and not a problem. If you need a tetanus shot and you choose not to have one, there is a rare chance of getting tetanus. Sickness from tetanus can be serious.  HOME CARE INSTRUCTIONS   If a dressing was applied, change it at least once a day or as directed by your caregiver. If the bandage sticks, soak it off with warm water.   Wash the area with water and a mild soap to remove all the ointment 2 times a day. Rinse off the soap and pat the area dry with a clean towel.   Reapply any ointment as directed by your caregiver. This will help prevent infection and keep the bandage from sticking. Use gauze over the wound and under the dressing to help keep the bandage from sticking.   Change your dressing right away if it becomes wet or dirty.   Only take over-the-counter or prescription medicines for pain, discomfort, or fever as  directed by your caregiver.   Follow up with your caregiver within 24-48 hours for a wound check, or as directed. If you were not given a wound-check appointment, look closely at your abrasion for redness, swelling, or pus. These are signs of infection. SEEK IMMEDIATE MEDICAL CARE IF:   You have increasing pain in the wound.   You have redness, swelling, or tenderness around the wound.   You have pus coming from the wound.   You have a fever or persistent symptoms for more than 2-3 days.  You have a fever and your symptoms suddenly get worse.  You have a bad smell coming from the wound or dressing.  MAKE SURE YOU:   Understand these instructions.  Will watch your condition.  Will get help right away if you are not doing well or get worse. Document Released: 05/25/2005 Document Revised: 08/01/2012 Document Reviewed: 07/19/2011 [facility] Patient Information 2015 [place], [place]. This information is not intended to replace advice given to you by your health care provider. Make sure you discuss any questions you have with your health care provider.  Bedbugs Bedbugs are tiny bugs that live in and around beds. During the day, they hide in mattresses and other places near beds. They come out at night and bite people lying in bed. They need blood to live and grow. Bedbugs can be found  in beds anywhere. Usually, they are found in places where many people come and go (hotels, shelters, hospitals). It does not matter whether the place is dirty or clean. Getting bitten by bedbugs rarely causes a medical problem. The biggest problem can be getting rid of them. This often takes the work of a Oncologist. CAUSES  Less use of pesticides. Bedbugs were common before the 1950s. Then, strong pesticides such as DDT nearly wiped them out. Today, these pesticides are not used because they harm the environment and can cause health problems.  More travel. Besides mattresses, bedbugs can also  live in clothing and luggage. They can come along as people travel from place to place. Bedbugs are more common in certain parts of the world. When people travel to those areas, the bugs can come home with them.  Presence of birds and bats. Bedbugs often infest birds and bats. If you have these animals in or near your home, bedbugs may infest your house, too. SYMPTOMS It does not hurt to be bitten by a bedbug. You will probably not wake up when you are bitten. Bedbugs usually bite areas of the skin that are not covered. Symptoms may show when you wake up, or they may take a day or more to show up. Symptoms may include:  Small red bumps on the skin. These might be lined up in a row or clustered in a group.  A darker red dot in the middle of red bumps.  Blisters on the skin. There may be swelling and very bad itching. These may be signs of an allergic reaction. This does not happen often. DIAGNOSIS Bedbug bites might look and feel like other types of insect bites. The bugs do not stay on the body like ticks or lice. They bite, drop off, and crawl away to hide. Your caregiver will probably:  Ask about your symptoms.  Ask about your recent activities and travel.  Check your skin for bedbug bites.  Ask you to check at home for signs of bedbugs. You should look for:  Spots or stains on the bed or nearby. This could be from bedbugs that were crushed or from their eggs or waste.  Bedbugs themselves. They are reddish-[place], oval, and flat. They do not fly. They are about the size of an apple seed.  Places to look for bedbugs include:  Beds. Check mattresses, headboards, box springs, and bed frames.  On drapes and curtains near the bed.  Under carpeting in the bedroom.  Behind electrical outlets.  Behind any wallpaper that is peeling.  Inside luggage. TREATMENT Most bedbug bites do not need treatment. They usually go away on their own in a few days. The bites are not dangerous. However,  treatment may be needed if you have scratched so much that your skin has become infected. You may also need treatment if you are allergic to bedbug bites. Treatment options include:  A drug that stops swelling and itching (corticosteroid). Usually, a cream is rubbed on the skin. If you have a bad rash, you may be given a corticosteroid pill.  Oral antihistamines. These are pills to help control itching.  Antibiotic medicines. An antibiotic may be prescribed for infected skin. HOME CARE INSTRUCTIONS   Take any medicine prescribed by your caregiver for your bites. Follow the directions carefully.  Consider wearing pajamas with long sleeves and pant legs.  Your bedroom may need to be treated. A pest control expert should make sure the bedbugs are gone.  You may need to throw away mattresses or luggage. Ask the pest control expert what you can do to keep the bedbugs from coming back. Common suggestions include:  Putting a plastic cover over your mattress.  Washing and drying your clothes and bedding in hot water and a hot dryer. The temperature should be hotter than 120 F (48.9 C). Bedbugs are killed by high temperatures.  Vacuuming carefully all around your bed. Vacuum in all cracks and crevices where the bugs might hide. Do this often.  Carefully checking all used furniture, bedding, or clothes that you bring into your house.  Eliminating bird nests and bat roosts.  If you get bedbug bites when traveling, check all your possessions carefully before bringing them into your house. If you find any bugs on clothes or in your luggage, consider throwing those items away. SEEK MEDICAL CARE IF:  You have red bug bites that keep coming back.  You have red bug bites that itch badly.  You have bug bites that cause a skin rash.  You have scratch marks that are red and sore. SEEK IMMEDIATE MEDICAL CARE IF: You have a fever. Document Released: 09/17/2010 Document Revised: 11/07/2011 Document  Reviewed: 09/17/2010 [facility] Patient Information 2015 [place], [place]. This information is not intended to replace advice given to you by your health care provider. Make sure you discuss any questions you have with your health care provider.

## 2015-02-19 NOTE — ED Provider Notes (Signed)
[facility] Emergency Department Provider Note  ____________________________________________  Time seen: 2:25 AM  I have reviewed the triage vital signs and the nursing notes.   HISTORY  Chief Complaint Rash      HPI Douglas Singh is a 34 y.o. male presents with multiple areas of pruritic nonspecific rash to bilateral lower extremity and dorsal aspect of the penis times one week. She admits to being sexually active but states he uses a condom "every time".     Past Medical History  Diagnosis Date  . Asthma   . Anxiety     There are no active problems to display for this patient.   Past Surgical History  Procedure Laterality Date  . Urethra surgery      Current Outpatient Rx  Name  Route  Sig  Dispense  Refill  . albuterol (PROVENTIL HFA;VENTOLIN HFA) 108 (90 BASE) MCG/ACT inhaler   Inhalation   Inhale 2 puffs into the lungs every 6 (six) hours as needed. For wheezing.         . clonazePAM (KLONOPIN) 0.5 MG tablet   Oral   Take 0.5 mg by mouth 3 (three) times daily.      0   . ibuprofen (ADVIL,MOTRIN) 800 MG tablet   Oral   Take 1 tablet (800 mg total) by mouth every 8 (eight) hours as needed.   30 tablet   0   . methocarbamol (ROBAXIN) 500 MG tablet   Oral   Take 1 tablet (500 mg total) by mouth every 6 (six) hours as needed for muscle spasms.   30 tablet   0   . omeprazole (PRILOSEC) 20 MG capsule   Oral   Take 1 capsule (20 mg total) by mouth 2 (two) times daily before a meal.   30 capsule   0     Allergies Codeine  No family history on file.  Social History History  Substance Use Topics  . Smoking status: Current Every Day Smoker -- 0.00 packs/day  . Smokeless tobacco: Never Used  . Alcohol Use: Yes    Review of Systems  Constitutional: Negative for fever. Eyes: Negative for visual changes. ENT: Negative for sore throat. Cardiovascular: Negative for chest pain. Respiratory: Negative for shortness of  breath. Gastrointestinal: Negative for abdominal pain, vomiting and diarrhea. Genitourinary: Negative for dysuria. Musculoskeletal: Negative for back pain. Skin: Positive for rash. Neurological: Negative for headaches, focal weakness or numbness.   10-point ROS otherwise negative.  ____________________________________________   PHYSICAL EXAM:  VITAL SIGNS: ED Triage Vitals  Enc Vitals Group     BP 02/18/15 2149 138/80 mmHg     Pulse Rate 02/18/15 2149 78     Resp 02/18/15 2149 18     Temp 02/18/15 2149 98.5 F (36.9 C)     Temp Source 02/18/15 2149 Oral     SpO2 02/18/15 2149 100 %     Weight 02/18/15 2149 205 lb (92.987 kg)     Height 02/18/15 2149  (1.727 m)     Head Cir --      Peak Flow --      Pain Score 02/18/15 2150 0     Pain Loc --      Pain Edu? --      Excl. in GC? --      Constitutional: Alert and oriented. Well appearing and in no distress. Eyes: Conjunctivae are normal. PERRL. Normal extraocular movements. ENT   Head: Normocephalic and atraumatic.   Nose: No  congestion/rhinnorhea.   Mouth/Throat: Mucous membranes are moist.   Neck: No stridor. Hematological/Lymphatic/Immunilogical: No cervical lymphadenopathy. Cardiovascular: Normal rate, regular rhythm. Normal and symmetric distal pulses are present in all extremities. No murmurs, rubs, or gallops. Respiratory: Normal respiratory effort without tachypnea nor retractions. Breath sounds are clear and equal bilaterally. No wheezes/rales/rhonchi. Gastrointestinal: Soft and nontender. No distention. There is no CVA tenderness. Genitourinary: deferred Musculoskeletal: Nontender with normal range of motion in all extremities. No joint effusions.  No lower extremity tenderness nor edema. Neurologic:  Normal speech and language. No gross focal neurologic deficits are appreciated. Speech is normal.  Skin:  Multiple distinct areas of excoriation bilateral lower extremity. Abrasion with scab noted  to the dorsal aspect of penis  Psychiatric: Mood and affect are normal. Speech and behavior are normal. Patient exhibits appropriate insight and judgment.  ____________________________________________       INITIAL IMPRESSION / ASSESSMENT AND PLAN / ED COURSE  Pertinent labs & imaging results that were available during my care of the patient were reviewed by me and considered in my medical decision making (see chart for details).    ____________________________________________   FINAL CLINICAL IMPRESSION(S) / ED DIAGNOSES  Final diagnoses:  Abrasion  Bed bug bite      [name], MD 02/19/15 [phone]

## 2015-02-19 NOTE — ED Notes (Signed)
Pt presents with scabs to his lower extremities and one scab located "under his penis". Unsure of where they came from. States he was cooking outside his brothers house the other night and thought it may have occurred at that time. Areas of healing noted to lower legs. No other symptoms.

## 2015-03-19 ENCOUNTER — Emergency Department
Admission: EM | Admit: 2015-03-19 | Discharge: 2015-03-19 | Disposition: A | Discharge status: Home / Self Care | Payer: Self-pay | Attending: Emergency Medicine | Admitting: Emergency Medicine

## 2015-03-19 ENCOUNTER — Encounter: Payer: Self-pay | Admitting: Emergency Medicine

## 2015-03-19 DIAGNOSIS — A64 Unspecified sexually transmitted disease: Secondary | ICD-10-CM | POA: Insufficient documentation

## 2015-03-19 DIAGNOSIS — N390 Urinary tract infection, site not specified: Secondary | ICD-10-CM | POA: Insufficient documentation

## 2015-03-19 DIAGNOSIS — Z720 Tobacco use: Secondary | ICD-10-CM | POA: Insufficient documentation

## 2015-03-19 DIAGNOSIS — Z79899 Other long term (current) drug therapy: Secondary | ICD-10-CM | POA: Insufficient documentation

## 2015-03-19 LAB — URINALYSIS COMPLETE WITH MICROSCOPIC (ARMC ONLY)
Bilirubin Urine: NEGATIVE
Glucose, UA: NEGATIVE mg/dL
HGB URINE DIPSTICK: NEGATIVE
Ketones, ur: NEGATIVE mg/dL
LEUKOCYTES UA: NEGATIVE
Nitrite: NEGATIVE
PH: 6 (ref 5.0–8.0)
Protein, ur: NEGATIVE mg/dL
RBC / HPF: NONE SEEN RBC/hpf (ref 0–5)
Specific Gravity, Urine: 1.017 (ref 1.005–1.030)

## 2015-03-19 LAB — CHLAMYDIA/NGC RT PCR (ARMC ONLY)
Chlamydia Tr: NOT DETECTED
N gonorrhoeae: NOT DETECTED

## 2015-03-19 MED ORDER — SULFAMETHOXAZOLE-TRIMETHOPRIM 400-80 MG PO TABS: 1.0000 | ORAL_TABLET | Freq: Two times a day (BID) | ORAL | Status: DC

## 2015-03-19 MED ORDER — AZITHROMYCIN 250 MG PO TABS: 1000.0000 mg | ORAL_TABLET | Freq: Once | ORAL | Status: DC

## 2015-03-19 MED ORDER — CEFTRIAXONE SODIUM 250 MG IJ SOLR: 250.0000 mg | Freq: Once | INTRAMUSCULAR | Status: DC

## 2015-03-19 NOTE — ED Provider Notes (Signed)
-----------------------------------------   4:51 PM on 03/19/2015 -----------------------------------------   Blood pressure 131/75, pulse 72, temperature 98.3 F (36.8 C), temperature source Oral, resp. rate 18, height  (1.727 m), weight 205 lb (92.987 kg), SpO2 100 %.  Assuming care from [facility] NP.  In short, Douglas Singh is a 34 y.o. male with a chief complaint of intermittent dysuria. Patient reports for 3 days he has intermittent "discomfort" with urination which is described as an irritation when he urinates. States occasional low back discomfort and bladder pressure. Denies pain. States these symptoms are only present when he needs to urinate or immediately after. Denies current pain or complaints.   Patient reports he is concerned for STD but states these symptoms are different than in past with his STDs. Denies known exposure to STD. States last STD was one year ago. Denies recent partner changes. Denies abdominal pain, fever, vomiting, nausea, current back pain or other complaints.   Patient reexamined: Well appearing, abdomen soft and nontender, back nontender, NO CVA tenderness. Lungs clear throughout.   Patient refused prostate exam at this time.   Marland Kitchen  Refer to the original H&P for additional details.   The current plan of care is to await gonorrhea/chlamydia urine testing.   1715: Called lab to evaluate status of GC/chylamydia. Per lab there was a machine error and had to restart lab and would result in 1.5 hours. Discussed this with patient , patient states he does NOT want to wait in ER for results. Patient requests to be discharged.   1730: Patient with urinary symptoms intermittently. Patient denies current complaints. Patient with rare bacteria in urine, will culture to evaluate for UTI and treat with x 3 days of bactrim. Discussed with patient in detail due to his concerns of STD, we can treat with azithromycin and rocephin in ER then have follow up. Patient  initially agreed to this plan but later refused. Patient states does not want antibiotics for STD. Discussed in detail with patient the importance of close follow up as well as safe sex, no sexual activity until he has followed up, and follow up with PCP and [facility] STD clinic (information/handouts given). Patient alert and oriented with decisional capacity and states will follow up outpatient and avoid sexual activity until follow up.     Renford Dills, NP 03/19/15 1752  Richardean Canal, MD 03/19/15 [phone]

## 2015-03-19 NOTE — ED Notes (Signed)
Pt presents with low back pain, frequent urination and burning for three days.

## 2015-03-19 NOTE — ED Provider Notes (Signed)
[facility] Emergency Department Provider Note ____________________________________________  Time seen: Approximately 3:46 PM  I have reviewed the triage vital signs and the nursing notes.   HISTORY  Chief Complaint Urinary Frequency   HPI Douglas Singh is a 34 y.o. male who presents to the emergency department for lower back pain and dysuria. Symptoms started 3 days ago. No known STD exposure.    Past Medical History  Diagnosis Date  . Asthma   . Anxiety     There are no active problems to display for this patient.   Past Surgical History  Procedure Laterality Date  . Urethra surgery      Current Outpatient Rx  Name  Route  Sig  Dispense  Refill  . albuterol (PROVENTIL HFA;VENTOLIN HFA) 108 (90 BASE) MCG/ACT inhaler   Inhalation   Inhale 2 puffs into the lungs every 6 (six) hours as needed. For wheezing.         . clonazePAM (KLONOPIN) 0.5 MG tablet   Oral   Take 0.5 mg by mouth 3 (three) times daily.      0   . ibuprofen (ADVIL,MOTRIN) 800 MG tablet   Oral   Take 1 tablet (800 mg total) by mouth every 8 (eight) hours as needed.   30 tablet   0   . methocarbamol (ROBAXIN) 500 MG tablet   Oral   Take 1 tablet (500 mg total) by mouth every 6 (six) hours as needed for muscle spasms.   30 tablet   0   . omeprazole (PRILOSEC) 20 MG capsule   Oral   Take 1 capsule (20 mg total) by mouth 2 (two) times daily before a meal.   30 capsule   0     Allergies Codeine  No family history on file.  Social History History  Substance Use Topics  . Smoking status: Current Every Day Smoker -- 0.00 packs/day  . Smokeless tobacco: Never Used  . Alcohol Use: Yes    Review of Systems Constitutional: No fever/chills Eyes: No visual changes. ENT: No sore throat. Cardiovascular: Denies chest pain. Respiratory: Denies shortness of breath. Gastrointestinal: No abdominal pain.  No nausea, no vomiting.  No diarrhea.  No  constipation. Genitourinary: Positive for dysuria. Musculoskeletal: Negative for back pain. Skin: Negative for rash. Neurological: Negative for headaches, focal weakness or numbness.  10-point ROS otherwise negative.  ____________________________________________   PHYSICAL EXAM:  VITAL SIGNS: ED Triage Vitals  Enc Vitals Group     BP 03/19/15 1352 131/75 mmHg     Pulse Rate 03/19/15 1352 72     Resp 03/19/15 1352 18     Temp 03/19/15 1352 98.3 F (36.8 C)     Temp Source 03/19/15 1352 Oral     SpO2 03/19/15 1352 100 %     Weight 03/19/15 1352 205 lb (92.987 kg)     Height 03/19/15 1352  (1.727 m)     Head Cir --      Peak Flow --      Pain Score 03/19/15 1353 5     Pain Loc --      Pain Edu? --      Excl. in GC? --     Constitutional: Alert and oriented. Well appearing and in no acute distress. Eyes: Conjunctivae are normal. PERRL. EOMI. Head: Atraumatic. Nose: No congestion/rhinnorhea. Mouth/Throat: Mucous membranes are moist. Neck: No stridor.   Cardiovascular: Normal rate, regular rhythm. Grossly normal heart sounds.  Good peripheral circulation. Respiratory: Normal  respiratory effort. Gastrointestinal: Soft and nontender. No distention. No abdominal bruits. No CVA tenderness. Genitourinary: No penile discharge. Musculoskeletal: No lower extremity tenderness nor edema.  No joint effusions. Neurologic:  Normal speech and language. No gross focal neurologic deficits are appreciated. No gait instability. Skin:  Skin is warm, dry and intact. No rash noted. Psychiatric: Mood and affect are normal. Speech and behavior are normal.  ____________________________________________   LABS (all labs ordered are listed, but only abnormal results are displayed)  Labs Reviewed  URINALYSIS COMPLETEWITH MICROSCOPIC ([facility] ONLY) - Abnormal; Notable for the following:    Color, Urine YELLOW (*)    APPearance CLEAR (*)    Bacteria, UA RARE (*)    Squamous Epithelial / LPF  0-5 (*)    All other components within normal limits  CHLAMYDIA/NGC RT PCR ([facility] ONLY)   ____________________________________________  EKG   ____________________________________________  RADIOLOGY   ____________________________________________   PROCEDURES  Procedure(s) performed: None  Critical Care performed: No  ____________________________________________   INITIAL IMPRESSION / ASSESSMENT AND PLAN / ED COURSE  Pertinent labs & imaging results that were available during my care of the patient were reviewed by me and considered in my medical decision making (see chart for details).  Patient care released to L. Hyacinth Meeker, NP at 1600. Awaiting GC Chlamydia results. ____________________________________________   FINAL CLINICAL IMPRESSION(S) / ED DIAGNOSES  Final diagnoses:  None      Chinita Pester, FNP 03/19/15 1600  Gayla Doss, MD 03/24/15 [phone]

## 2015-03-19 NOTE — Discharge Instructions (Signed)
Take medication as prescribed. Drink plenty of water. No sexual activity until yourself and partner have been followed up. Follow-up with [facility]. See handout.  Return to the ER for new or worsening concerns.  Urinary Tract Infection Urinary tract infections (UTIs) can develop anywhere along your urinary tract. Your urinary tract is your body's drainage system for removing wastes and extra water. Your urinary tract includes two kidneys, two ureters, a bladder, and a urethra. Your kidneys are a pair of bean-shaped organs. Each kidney is about the size of your fist. They are located below your ribs, one on each side of your spine. CAUSES Infections are caused by microbes, which are microscopic organisms, including fungi, viruses, and bacteria. These organisms are so small that they can only be seen through a microscope. Bacteria are the microbes that most commonly cause UTIs. SYMPTOMS  Symptoms of UTIs may vary by age and gender of the patient and by the location of the infection. Symptoms in young women typically include a frequent and intense urge to urinate and a painful, burning feeling in the bladder or urethra during urination. Older women and men are more likely to be tired, shaky, and weak and have muscle aches and abdominal pain. A fever may mean the infection is in your kidneys. Other symptoms of a kidney infection include pain in your back or sides below the ribs, nausea, and vomiting. DIAGNOSIS To diagnose a UTI, your caregiver will ask you about your symptoms. Your caregiver also will ask to provide a urine sample. The urine sample will be tested for bacteria and white blood cells. White blood cells are made by your body to help fight infection. TREATMENT  Typically, UTIs can be treated with medication. Because most UTIs are caused by a bacterial infection, they usually can be treated with the use of antibiotics. The choice of antibiotic and length of treatment  depend on your symptoms and the type of bacteria causing your infection. HOME CARE INSTRUCTIONS  If you were prescribed antibiotics, take them exactly as your caregiver instructs you. Finish the medication even if you feel better after you have only taken some of the medication.  Drink enough water and fluids to keep your urine clear or pale yellow.  Avoid caffeine, tea, and carbonated beverages. They tend to irritate your bladder.  Empty your bladder often. Avoid holding urine for long periods of time.  Empty your bladder before and after sexual intercourse.  After a bowel movement, women should cleanse from front to back. Use each tissue only once. SEEK MEDICAL CARE IF:   You have back pain.  You develop a fever.  Your symptoms do not begin to resolve within 3 days. SEEK IMMEDIATE MEDICAL CARE IF:   You have severe back pain or lower abdominal pain.  You develop chills.  You have nausea or vomiting.  You have continued burning or discomfort with urination. MAKE SURE YOU:   Understand these instructions.  Will watch your condition.  Will get help right away if you are not doing well or get worse. Document Released: 05/25/2005 Document Revised: 02/14/2012 Document Reviewed: 09/23/2011 [facility] Patient Information 2015 [place], [place]. This information is not intended to replace advice given to you by your health care provider. Make sure you discuss any questions you have with your health care provider.  Sexually Transmitted Disease A sexually transmitted disease (STD) is a disease or infection often passed to another person during sex. However, STDs can be passed through nonsexual ways.  An STD can be passed through:  Spit (saliva).  Semen.  Blood.  Mucus from the vagina.  Pee (urine). HOW CAN I LESSEN MY CHANCES OF GETTING AN STD?  Use:  Latex condoms.  Water-soluble lubricants with condoms. Do not use petroleum jelly or oils.  Dental dams. These are small  pieces of latex that are used as a barrier during oral sex.  Avoid having more than one sex partner.  Do not have sex with someone who has other sex partners.  Do not have sex with anyone you do not know or who is at high risk for an STD.  Avoid risky sex that can break your skin.  Do not have sex if you have open sores on your mouth or skin.  Avoid drinking too much alcohol or taking illegal drugs. Alcohol and drugs can affect your good judgment.  Avoid oral and anal sex acts.  Get shots (vaccines) for HPV and hepatitis.  If you are at risk of being infected with HIV, it is advised that you take a certain medicine daily to prevent HIV infection. This is called pre-exposure prophylaxis (PrEP). You may be at risk if:  You are a man who has sex with other men (MSM).  You are attracted to the opposite sex (heterosexual) and are having sex with more than one partner.  You take drugs with a needle.  You have sex with someone who has HIV.  Talk with your doctor about if you are at high risk of being infected with HIV. If you begin to take PrEP, get tested for HIV first. Get tested every 3 months for as long as you are taking PrEP. WHAT SHOULD I DO IF I THINK I HAVE AN STD?  See your doctor.  Tell your sex partner(s) that you have an STD. They should be tested and treated.  Do not have sex until your doctor says it is okay. WHEN SHOULD I GET HELP? Get help right away if:  You have bad belly (abdominal) pain.  You are a man and have puffiness (swelling) or pain in your testicles.  You are a woman and have puffiness in your vagina. Document Released: 09/22/2004 Document Revised: 08/20/2013 Document Reviewed: 02/08/2013 [facility] Patient Information 2015 [place], [place]. This information is not intended to replace advice given to you by your health care provider. Make sure you discuss any questions you have with your health care provider.

## 2015-03-21 LAB — URINE CULTURE: Culture: NO GROWTH

## 2015-03-31 ENCOUNTER — Encounter: Payer: Self-pay | Admitting: Emergency Medicine

## 2015-03-31 ENCOUNTER — Emergency Department
Admission: EM | Admit: 2015-03-31 | Discharge: 2015-03-31 | Disposition: A | Discharge status: Home / Self Care | Payer: Self-pay | Attending: Emergency Medicine | Admitting: Emergency Medicine

## 2015-03-31 DIAGNOSIS — J4520 Mild intermittent asthma, uncomplicated: Secondary | ICD-10-CM

## 2015-03-31 DIAGNOSIS — Z720 Tobacco use: Secondary | ICD-10-CM | POA: Insufficient documentation

## 2015-03-31 DIAGNOSIS — F419 Anxiety disorder, unspecified: Secondary | ICD-10-CM | POA: Insufficient documentation

## 2015-03-31 DIAGNOSIS — Z79899 Other long term (current) drug therapy: Secondary | ICD-10-CM | POA: Insufficient documentation

## 2015-03-31 DIAGNOSIS — Z792 Long term (current) use of antibiotics: Secondary | ICD-10-CM | POA: Insufficient documentation

## 2015-03-31 DIAGNOSIS — J4521 Mild intermittent asthma with (acute) exacerbation: Secondary | ICD-10-CM | POA: Insufficient documentation

## 2015-03-31 MED ORDER — ALBUTEROL SULFATE HFA 108 (90 BASE) MCG/ACT IN AERS: 2.0000 | INHALATION_SPRAY | Freq: Four times a day (QID) | RESPIRATORY_TRACT | Status: DC | PRN

## 2015-03-31 MED ORDER — IPRATROPIUM-ALBUTEROL 0.5-2.5 (3) MG/3ML IN SOLN
3.0000 mL | Freq: Once | RESPIRATORY_TRACT | Status: AC
  Administered 2015-03-31: 3 mL via RESPIRATORY_TRACT
  Filled 2015-03-31: qty 3

## 2015-03-31 NOTE — Discharge Instructions (Signed)

## 2015-03-31 NOTE — ED Provider Notes (Signed)
[facility] Emergency Department Provider Note  ____________________________________________  Time seen: On arrival  I have reviewed the triage vital signs and the nursing notes.   HISTORY  Chief Complaint Shortness of Breath    HPI Huntley Knoop is a 34 y.o. male who presents with mild shortness of breath. He reports he has been out of his inhaler for a few days. He states he has not been taking his clonazepam because he was drinking heavily the entire weekend. Patient is frequent visitor to our ER. He denies chest pain he denies shortness of breath. No abdominal pain or nausea and vomiting    Past Medical History  Diagnosis Date  . Asthma   . Anxiety     There are no active problems to display for this patient.   Past Surgical History  Procedure Laterality Date  . Urethra surgery      Current Outpatient Rx  Name  Route  Sig  Dispense  Refill  . albuterol (PROVENTIL HFA;VENTOLIN HFA) 108 (90 BASE) MCG/ACT inhaler   Inhalation   Inhale 2 puffs into the lungs every 6 (six) hours as needed. For wheezing.         . clonazePAM (KLONOPIN) 0.5 MG tablet   Oral   Take 0.5 mg by mouth 3 (three) times daily.      0   . ibuprofen (ADVIL,MOTRIN) 800 MG tablet   Oral   Take 1 tablet (800 mg total) by mouth every 8 (eight) hours as needed.   30 tablet   0   . methocarbamol (ROBAXIN) 500 MG tablet   Oral   Take 1 tablet (500 mg total) by mouth every 6 (six) hours as needed for muscle spasms.   30 tablet   0   . omeprazole (PRILOSEC) 20 MG capsule   Oral   Take 1 capsule (20 mg total) by mouth 2 (two) times daily before a meal.   30 capsule   0   . sulfamethoxazole-trimethoprim (BACTRIM) 400-80 MG per tablet   Oral   Take 1 tablet by mouth 2 (two) times daily.   6 tablet   0     Allergies Codeine  No family history on file.  Social History History  Substance Use Topics  . Smoking status: Current Every Day Smoker -- 0.00  packs/day  . Smokeless tobacco: Never Used  . Alcohol Use: Yes    Review of Systems  Constitutional: Negative for fever. Eyes: Negative for visual changes. ENT: Negative for sore throat   Genitourinary: Negative for dysuria. Musculoskeletal: Negative for back pain. Skin: Negative for rash. Neurological: Negative for headaches or focal weakness   ____________________________________________   PHYSICAL EXAM:  VITAL SIGNS: ED Triage Vitals  Enc Vitals Group     BP 03/31/15 1029 136/90 mmHg     Pulse Rate 03/31/15 1029 62     Resp 03/31/15 1029 16     Temp 03/31/15 1029 98 F (36.7 C)     Temp Source 03/31/15 1029 Oral     SpO2 03/31/15 1029 99 %     Weight 03/31/15 1029 205 lb (92.987 kg)     Height 03/31/15 1029  (1.803 m)     Head Cir --      Peak Flow --      Pain Score --      Pain Loc --      Pain Edu? --      Excl. in GC? --  Constitutional: Alert and oriented. Well appearing and in no distress. Anxious Eyes: Conjunctivae are normal.  ENT   Head: Normocephalic and atraumatic.   Mouth/Throat: Mucous membranes are moist. Cardiovascular: Normal rate, regular rhythm.  Respiratory: Normal respiratory effort without tachypnea nor retractions. No wheezes Gastrointestinal: Soft and non-tender in all quadrants. No distention. There is no CVA tenderness. Musculoskeletal: Nontender with normal range of motion in all extremities. Neurologic:  Normal speech and language. No gross focal neurologic deficits are appreciated. Skin:  Skin is warm, dry and intact. No rash noted. Psychiatric: Patient with mild anxiety  ____________________________________________    LABS (pertinent positives/negatives)  Labs Reviewed - No data to display  ____________________________________________     ____________________________________________    RADIOLOGY I have personally reviewed any xrays that were ordered on this  patient: None  ____________________________________________   PROCEDURES  Procedure(s) performed: none   ____________________________________________   INITIAL IMPRESSION / ASSESSMENT AND PLAN / ED COURSE  Pertinent labs & imaging results that were available during my care of the patient were reviewed by me and considered in my medical decision making (see chart for details).  Patient well-appearing and in no distress. Benign exam, he is slightly anxious. We will give a DuoNeb here and refill his inhaler and he is okay for discharge  ____________________________________________   FINAL CLINICAL IMPRESSION(S) / ED DIAGNOSES  Final diagnoses:  Asthma, mild intermittent, uncomplicated     Jene Every, MD 03/31/15 1056

## 2015-03-31 NOTE — ED Notes (Signed)
Patient presents to the ED with shortness of breath x 1 hour.  Patient is speaking quickly in full sentences.  Patient states he hasn't taken his clonazepam for the past few days because he has been drinking alcohol over the weekend.  Patient states he is also out of his inhaler.  Patient reports feeling slightly anxious.  Denies SI and HI.

## 2015-04-13 ENCOUNTER — Emergency Department
Admission: EM | Admit: 2015-04-13 | Discharge: 2015-04-13 | Disposition: A | Discharge status: Home / Self Care | Payer: Self-pay | Attending: Emergency Medicine | Admitting: Emergency Medicine

## 2015-04-13 ENCOUNTER — Emergency Department: Payer: Self-pay

## 2015-04-13 ENCOUNTER — Other Ambulatory Visit: Payer: Self-pay

## 2015-04-13 ENCOUNTER — Encounter: Payer: Self-pay | Admitting: Emergency Medicine

## 2015-04-13 DIAGNOSIS — J45901 Unspecified asthma with (acute) exacerbation: Secondary | ICD-10-CM | POA: Insufficient documentation

## 2015-04-13 DIAGNOSIS — R079 Chest pain, unspecified: Secondary | ICD-10-CM | POA: Insufficient documentation

## 2015-04-13 DIAGNOSIS — Z720 Tobacco use: Secondary | ICD-10-CM | POA: Insufficient documentation

## 2015-04-13 DIAGNOSIS — Z79899 Other long term (current) drug therapy: Secondary | ICD-10-CM | POA: Insufficient documentation

## 2015-04-13 DIAGNOSIS — Z792 Long term (current) use of antibiotics: Secondary | ICD-10-CM | POA: Insufficient documentation

## 2015-04-13 HISTORY — DX: Gastro-esophageal reflux disease without esophagitis: K21.9

## 2015-04-13 HISTORY — DX: Panic disorder (episodic paroxysmal anxiety): F41.0

## 2015-04-13 HISTORY — DX: Reserved for inherently not codable concepts without codable children: IMO0001

## 2015-04-13 LAB — BASIC METABOLIC PANEL
ANION GAP: 8 (ref 5–15)
BUN: 13 mg/dL (ref 6–20)
CHLORIDE: 99 mmol/L — AB (ref 101–111)
CO2: 30 mmol/L (ref 22–32)
Calcium: 9.5 mg/dL (ref 8.9–10.3)
Creatinine, Ser: 0.97 mg/dL (ref 0.61–1.24)
GFR calc Af Amer: >60 mL/min (ref >60)
GFR calc non Af Amer: >60 mL/min (ref >60)
Glucose, Bld: 88 mg/dL (ref 65–99)
POTASSIUM: 3.7 mmol/L (ref 3.5–5.1)
SODIUM: 137 mmol/L (ref 135–145)

## 2015-04-13 LAB — CBC
HEMATOCRIT: 45.2 % (ref 40.0–52.0)
HEMOGLOBIN: 15 g/dL (ref 13.0–18.0)
MCH: 29.4 pg (ref 26.0–34.0)
MCHC: 33.3 g/dL (ref 32.0–36.0)
MCV: 88.2 fL (ref 80.0–100.0)
Platelets: 239 10*3/uL (ref 150–440)
RBC: 5.12 MIL/uL (ref 4.40–5.90)
RDW: 14.8 % — ABNORMAL HIGH (ref 11.5–14.5)
WBC: 7.3 10*3/uL (ref 3.8–10.6)

## 2015-04-13 LAB — TROPONIN I: Troponin I: <0.03 ng/mL (ref <0.031)

## 2015-04-13 MED ORDER — GI COCKTAIL ~~LOC~~
ORAL | Status: AC
  Filled 2015-04-13: qty 30

## 2015-04-13 MED ORDER — GI COCKTAIL ~~LOC~~
30.0000 mL | Freq: Once | ORAL | Status: AC
  Administered 2015-04-13: 30 mL via ORAL

## 2015-04-13 NOTE — ED Provider Notes (Signed)
[facility] Emergency Department Provider Note  Time seen: 6:46 PM  I have reviewed the triage vital signs and the nursing notes.   HISTORY  Chief Complaint Chest Pain    HPI Douglas Singh is a 34 y.o. male this emergency department with chest pain. According to the patient he's been having intermittent chest pain since this morning. He states he took Prilosec and Gas-X this morning with some relief, but then around lunchtime the pain came back so he took a clonazepam as a thought it could be his anxiety. He states the pain came back once again so he came to the emergency department for evaluation. He states some shortness of breath and dizziness with these chest pain episodes. Describes the chest pain is moderate, sharp. No worse with movement or deep inspiration. No cough. Patient states he has had symptoms similar in the past but they seem to resolve after clonazepam. Denies any leg pain or swelling. Denies nausea, vomiting, diaphoresis.     Past Medical History  Diagnosis Date  . Asthma   . Anxiety   . Reflux   . Panic attacks     There are no active problems to display for this patient.   Past Surgical History  Procedure Laterality Date  . Urethra surgery      Current Outpatient Rx  Name  Route  Sig  Dispense  Refill  . albuterol (PROVENTIL HFA;VENTOLIN HFA) 108 (90 BASE) MCG/ACT inhaler   Inhalation   Inhale 2 puffs into the lungs every 6 (six) hours as needed for wheezing or shortness of breath.   1 Inhaler   2   . clonazePAM (KLONOPIN) 0.5 MG tablet   Oral   Take 0.5 mg by mouth 3 (three) times daily.      0   . ibuprofen (ADVIL,MOTRIN) 800 MG tablet   Oral   Take 1 tablet (800 mg total) by mouth every 8 (eight) hours as needed.   30 tablet   0   . methocarbamol (ROBAXIN) 500 MG tablet   Oral   Take 1 tablet (500 mg total) by mouth every 6 (six) hours as needed for muscle spasms.   30 tablet   0   . omeprazole (PRILOSEC) 20  MG capsule   Oral   Take 1 capsule (20 mg total) by mouth 2 (two) times daily before a meal.   30 capsule   0   . sulfamethoxazole-trimethoprim (BACTRIM) 400-80 MG per tablet   Oral   Take 1 tablet by mouth 2 (two) times daily.   6 tablet   0     Allergies Codeine  No family history on file.  Social History Social History  Substance Use Topics  . Smoking status: Current Some Day Smoker -- 0.00 packs/day    Types: Cigarettes  . Smokeless tobacco: Never Used  . Alcohol Use: Yes     Comment: socially    Review of Systems Constitutional: Negative for fever. Cardiovascular: Positive for chest pain. Respiratory: Positive for intermittent shortness of breath. Gastrointestinal: Negative for abdominal pain Neurological: Negative for headache 10-point ROS otherwise negative.  ____________________________________________   PHYSICAL EXAM:  VITAL SIGNS: ED Triage Vitals  Enc Vitals Group     BP 04/13/15 1803 133/82 mmHg     Pulse Rate 04/13/15 1803 67     Resp 04/13/15 1803 16     Temp 04/13/15 1803 98.1 F (36.7 C)     Temp Source 04/13/15 1803 Oral  SpO2 04/13/15 1803 99 %     Weight 04/13/15 1804 205 lb (92.987 kg)     Height 04/13/15 1804 5\' 8"  (1.727 m)     Head Cir --      Peak Flow --      Pain Score 04/13/15 1804 2     Pain Loc --      Pain Edu? --      Excl. in GC? --     Constitutional: Alert and oriented. Well appearing and in no distress. Eyes: Normal exam ENT   Mouth/Throat: Mucous membranes are moist. Cardiovascular: Normal rate, regular rhythm. No murmurs, rubs, or gallops. Respiratory: Normal respiratory effort without tachypnea nor retractions. Breath sounds are clear and equal bilaterally. No wheezes/rales/rhonchi. Gastrointestinal: Soft and nontender. No distention.  Musculoskeletal: Nontender with normal range of motion in all extremities. No lower extremity tenderness or edema. Neurologic:  Normal speech and language. No gross focal  neurologic deficits Skin:  Skin is warm, dry and intact.  Psychiatric: Mood and affect are normal. Speech and behavior are normal. Patient exhibits appropriate insight and judgment.  ____________________________________________    EKG  EKG reviewed and interpreted by myself shows normal sinus rhythm at 62 bpm, narrow QRS, normal axis, normal intervals, no ST changes noted.  ____________________________________________    RADIOLOGY  Chest x-ray within normal limits  ____________________________________________   INITIAL IMPRESSION / ASSESSMENT AND PLAN / ED COURSE  Pertinent labs & imaging results that were available during my care of the patient were reviewed by me and considered in my medical decision making (see chart for details).  Patient with intermittent chest pain shortness of breath today. States similar symptoms in the past with gastric reflux and anxiety, but his symptoms did not resolve after taking Prilosec or clonazepam so he came to the emergency department for evaluation. Overall the patient appears very well. States minimal chest pain currently. EKG is normal, chest x-ray is normal. We will check lab work including troponin, and monitor very closely in the emergency department.  X-ray normal. Labs are within normal limits. Troponin is negative. We'll discharge patient home with primary care follow-up.  ____________________________________________   FINAL CLINICAL IMPRESSION(S) / ED DIAGNOSES  Chest pain   Minna Antis, MD 04/13/15 [phone]

## 2015-04-13 NOTE — Discharge Instructions (Signed)

## 2015-04-13 NOTE — ED Notes (Signed)
Pt reports intermittent chest pain/epigastric pain since this morning, reports taking gas x this morning, 1  aspiring and omeprazole with lunch, and 1 clonazepam at 1300 and hasn't found any relief. Pt reports shortness of breath with episodes. No distress noted at this time.

## 2015-04-13 NOTE — ED Notes (Signed)
Pt has upper epigastric pain.  States it feels tight in my chest.  Pt took 2 gasx pills and clonazapam with some relief.  Sx began this morning.

## 2015-06-11 ENCOUNTER — Emergency Department
Admission: EM | Admit: 2015-06-11 | Discharge: 2015-06-11 | Disposition: A | Discharge status: Home / Self Care | Payer: Self-pay | Attending: Emergency Medicine | Admitting: Emergency Medicine

## 2015-06-11 ENCOUNTER — Encounter: Payer: Self-pay | Admitting: Emergency Medicine

## 2015-06-11 DIAGNOSIS — Z79899 Other long term (current) drug therapy: Secondary | ICD-10-CM | POA: Insufficient documentation

## 2015-06-11 DIAGNOSIS — Z792 Long term (current) use of antibiotics: Secondary | ICD-10-CM | POA: Insufficient documentation

## 2015-06-11 DIAGNOSIS — J4520 Mild intermittent asthma, uncomplicated: Secondary | ICD-10-CM

## 2015-06-11 DIAGNOSIS — Z720 Tobacco use: Secondary | ICD-10-CM | POA: Insufficient documentation

## 2015-06-11 DIAGNOSIS — J302 Other seasonal allergic rhinitis: Secondary | ICD-10-CM

## 2015-06-11 DIAGNOSIS — J4521 Mild intermittent asthma with (acute) exacerbation: Secondary | ICD-10-CM | POA: Insufficient documentation

## 2015-06-11 MED ORDER — CETIRIZINE HCL 10 MG PO CAPS: 10.0000 mg | ORAL_CAPSULE | Freq: Every day | ORAL | Status: DC

## 2015-06-11 MED ORDER — ALBUTEROL SULFATE HFA 108 (90 BASE) MCG/ACT IN AERS: 2.0000 | INHALATION_SPRAY | Freq: Four times a day (QID) | RESPIRATORY_TRACT | Status: DC | PRN

## 2015-06-11 NOTE — ED Notes (Signed)
C/o productive cough and congestion with sore throat since yesterday

## 2015-06-11 NOTE — Discharge Instructions (Signed)

## 2015-06-11 NOTE — ED Provider Notes (Signed)
[facility] Emergency Department Provider Note ____________________________________________  Time seen: Approximately 2:50 PM  I have reviewed the triage vital signs and the nursing notes.   HISTORY  Chief Complaint Cough and Sore Throat   HPI Douglas Singh is a 34 y.o. male who presents to the emergency department for evaluation of sore throat, productive cough, and pain behind his eyes. These symptoms started yesterday. He reports that he has asthma, but has been out of his inhaler for a couple of months. He denies fever.   Past Medical History  Diagnosis Date  . Asthma   . Anxiety   . Reflux   . Panic attacks     There are no active problems to display for this patient.   Past Surgical History  Procedure Laterality Date  . Urethra surgery      Current Outpatient Rx  Name  Route  Sig  Dispense  Refill  . albuterol (PROVENTIL HFA;VENTOLIN HFA) 108 (90 BASE) MCG/ACT inhaler   Inhalation   Inhale 2 puffs into the lungs every 6 (six) hours as needed for wheezing or shortness of breath.   1 Inhaler   2   . Cetirizine HCl 10 MG CAPS   Oral   Take 1 capsule (10 mg total) by mouth daily.   30 capsule   3   . clonazePAM (KLONOPIN) 0.5 MG tablet   Oral   Take 0.5 mg by mouth 3 (three) times daily.      0   . ibuprofen (ADVIL,MOTRIN) 800 MG tablet   Oral   Take 1 tablet (800 mg total) by mouth every 8 (eight) hours as needed.   30 tablet   0   . methocarbamol (ROBAXIN) 500 MG tablet   Oral   Take 1 tablet (500 mg total) by mouth every 6 (six) hours as needed for muscle spasms.   30 tablet   0   . omeprazole (PRILOSEC) 20 MG capsule   Oral   Take 1 capsule (20 mg total) by mouth 2 (two) times daily before a meal.   30 capsule   0   . sulfamethoxazole-trimethoprim (BACTRIM) 400-80 MG per tablet   Oral   Take 1 tablet by mouth 2 (two) times daily.   6 tablet   0     Allergies Codeine  No family history on file.  Social  History Social History  Substance Use Topics  . Smoking status: Current Some Day Smoker -- 0.00 packs/day    Types: Cigarettes  . Smokeless tobacco: Never Used  . Alcohol Use: Yes     Comment: socially    Review of Systems Constitutional: No fever/chills Eyes: No visual changes. ENT: No sore throat. Cardiovascular: Denies chest pain. Respiratory: Occasional shortness of breath. Positive for cough. Gastrointestinal: Negative for abdominal pain. Negative for nausea,  negative for vomiting.  Negative for diarrhea.  Genitourinary: Negative for dysuria. Musculoskeletal: Negative for body aches Skin: Negative for rash. Neurological: Negative for headaches, Negative for focal weakness or numbness.  10-point ROS otherwise negative.  ____________________________________________   PHYSICAL EXAM:  VITAL SIGNS: ED Triage Vitals  Enc Vitals Group     BP 06/11/15 1355 137/79 mmHg     Pulse Rate 06/11/15 1355 87     Resp 06/11/15 1355 18     Temp 06/11/15 1355 98.1 F (36.7 C)     Temp Source 06/11/15 1355 Oral     SpO2 06/11/15 1355 99 %     Weight 06/11/15 1355  217 lb (98.431 kg)     Height 06/11/15 1355 5\' 8"  (1.727 m)     Head Cir --      Peak Flow --      Pain Score 06/11/15 1405 6     Pain Loc --      Pain Edu? --      Excl. in GC? --     Constitutional: Alert and oriented. Well appearing and in no acute distress. Eyes: Conjunctivae are normal. PERRL. EOMI. Allergic shiners noted both eyes. Head: Atraumatic. Nose: No congestion/rhinnorhea. Mouth/Throat: Mucous membranes are moist.  Oropharynx non-erythematous. Neck: No stridor.  Lymphatic: No cervical lymphadenopathy. Cardiovascular: Normal rate, regular rhythm. Grossly normal heart sounds.  Good peripheral circulation. Respiratory: Normal respiratory effort.  No retractions. Lungs clear to auscultation bilaterally. Gastrointestinal: Soft and nontender. No distention. No abdominal bruits. No CVA  tenderness. Musculoskeletal: No joint pain reported. Neurologic:  Normal speech and language. No gross focal neurologic deficits are appreciated. Speech is normal. No gait instability. Skin:  Skin is warm, dry and intact. No rash noted. Psychiatric: Mood and affect are normal. Speech and behavior are normal.  ____________________________________________   LABS (all labs ordered are listed, but only abnormal results are displayed)  Labs Reviewed - No data to display ____________________________________________  EKG   ____________________________________________  RADIOLOGY  Not indicated ____________________________________________   PROCEDURES  Procedure(s) performed: None  Critical Care performed: No  ____________________________________________   INITIAL IMPRESSION / ASSESSMENT AND PLAN / ED COURSE  Pertinent labs & imaging results that were available during my care of the patient were reviewed by me and considered in my medical decision making (see chart for details).  Patient was advised to follow-up with the primary care provider of his choice for symptoms that are not improving over the next 2-3 days. He was given information on medication management program to get his prescriptions filled. He was advised to take the allergy medication daily and to use his inhaler 2 puffs every 4 hours as needed for cough or shortness of breath. He was advised to return to the emergency department for symptoms that change or worsen if he is unable schedule an appointment. ____________________________________________   FINAL CLINICAL IMPRESSION(S) / ED DIAGNOSES  Final diagnoses:  Seasonal allergies  Asthma, mild intermittent, uncomplicated    \  Chinita PesterCari B Laray Corbit, FNP 06/11/15 1453  Sharman CheekPhillip Stafford, MD 06/11/15 2126

## 2015-06-21 ENCOUNTER — Emergency Department
Admission: EM | Admit: 2015-06-21 | Discharge: 2015-06-21 | Disposition: A | Discharge status: Home / Self Care | Payer: Self-pay | Attending: Emergency Medicine | Admitting: Emergency Medicine

## 2015-06-21 ENCOUNTER — Emergency Department: Payer: Self-pay

## 2015-06-21 DIAGNOSIS — J45901 Unspecified asthma with (acute) exacerbation: Secondary | ICD-10-CM | POA: Insufficient documentation

## 2015-06-21 DIAGNOSIS — J069 Acute upper respiratory infection, unspecified: Secondary | ICD-10-CM | POA: Insufficient documentation

## 2015-06-21 DIAGNOSIS — Z720 Tobacco use: Secondary | ICD-10-CM | POA: Insufficient documentation

## 2015-06-21 DIAGNOSIS — Z792 Long term (current) use of antibiotics: Secondary | ICD-10-CM | POA: Insufficient documentation

## 2015-06-21 DIAGNOSIS — Z79899 Other long term (current) drug therapy: Secondary | ICD-10-CM | POA: Insufficient documentation

## 2015-06-21 DIAGNOSIS — K219 Gastro-esophageal reflux disease without esophagitis: Secondary | ICD-10-CM | POA: Insufficient documentation

## 2015-06-21 LAB — BASIC METABOLIC PANEL
Anion gap: 14 (ref 5–15)
BUN: 13 mg/dL (ref 6–20)
CO2: 25 mmol/L (ref 22–32)
CREATININE: 0.89 mg/dL (ref 0.61–1.24)
Calcium: 9.7 mg/dL (ref 8.9–10.3)
Chloride: 98 mmol/L — ABNORMAL LOW (ref 101–111)
GFR calc Af Amer: >60 mL/min (ref >60)
GFR calc non Af Amer: >60 mL/min (ref >60)
Glucose, Bld: 75 mg/dL (ref 65–99)
Potassium: 3.7 mmol/L (ref 3.5–5.1)
SODIUM: 137 mmol/L (ref 135–145)

## 2015-06-21 LAB — CBC
HCT: 44.3 % (ref 40.0–52.0)
Hemoglobin: 15.2 g/dL (ref 13.0–18.0)
MCH: 30.1 pg (ref 26.0–34.0)
MCHC: 34.3 g/dL (ref 32.0–36.0)
MCV: 87.8 fL (ref 80.0–100.0)
PLATELETS: 254 10*3/uL (ref 150–440)
RBC: 5.05 MIL/uL (ref 4.40–5.90)
RDW: 13.5 % (ref 11.5–14.5)
WBC: 8.8 10*3/uL (ref 3.8–10.6)

## 2015-06-21 LAB — TROPONIN I

## 2015-06-21 MED ORDER — FAMOTIDINE 20 MG PO TABS
40.0000 mg | ORAL_TABLET | Freq: Once | ORAL | Status: AC
  Administered 2015-06-21: 40 mg via ORAL
  Filled 2015-06-21: qty 2

## 2015-06-21 MED ORDER — SUCRALFATE 1 G PO TABS: 1.0000 g | ORAL_TABLET | Freq: Four times a day (QID) | ORAL | Status: DC

## 2015-06-21 MED ORDER — DEXAMETHASONE 4 MG PO TABS
6.0000 mg | ORAL_TABLET | Freq: Once | ORAL | Status: AC
  Administered 2015-06-21: 6 mg via ORAL
  Filled 2015-06-21: qty 1

## 2015-06-21 MED ORDER — RANITIDINE HCL 150 MG PO CAPS: 150.0000 mg | ORAL_CAPSULE | Freq: Two times a day (BID) | ORAL | Status: DC

## 2015-06-21 MED ORDER — GI COCKTAIL ~~LOC~~
30.0000 mL | Freq: Once | ORAL | Status: AC
  Administered 2015-06-21: 30 mL via ORAL
  Filled 2015-06-21: qty 30

## 2015-06-21 NOTE — ED Notes (Signed)
Patient reports sitting watching TV when developed left sided chest pain that is non radiating.  Patient reports feels like a gas bubble.  + Short of breath.

## 2015-06-21 NOTE — ED Provider Notes (Signed)
[facility] Emergency Department Provider Note  ____________________________________________  Time seen: 7:50 PM  I have reviewed the triage vital signs and the nursing notes.   HISTORY  Chief Complaint Chest Pain    HPI Douglas Singh is a 34 y.o. male who complains of upper abdominal discomfort that felt like gurgling today. He's also had sore throat and nonproductive cough over the past few days. He also notes that he was seen a week ago by Dr. he told he had some fluid in his ears. He reports some chest pain and shortness of breath but does not seem particularly concerned with it.  He's been off his anxiety medicine for 2 days.   Past Medical History  Diagnosis Date  . Asthma   . Anxiety   . Reflux   . Panic attacks      There are no active problems to display for this patient.    Past Surgical History  Procedure Laterality Date  . Urethra surgery       Current Outpatient Rx  Name  Route  Sig  Dispense  Refill  . albuterol (PROVENTIL HFA;VENTOLIN HFA) 108 (90 BASE) MCG/ACT inhaler   Inhalation   Inhale 2 puffs into the lungs every 6 (six) hours as needed for wheezing or shortness of breath.   1 Inhaler   2   . Cetirizine HCl 10 MG CAPS   Oral   Take 1 capsule (10 mg total) by mouth daily.   30 capsule   3   . clonazePAM (KLONOPIN) 0.5 MG tablet   Oral   Take 0.5 mg by mouth 3 (three) times daily.      0   . ibuprofen (ADVIL,MOTRIN) 800 MG tablet   Oral   Take 1 tablet (800 mg total) by mouth every 8 (eight) hours as needed.   30 tablet   0   . methocarbamol (ROBAXIN) 500 MG tablet   Oral   Take 1 tablet (500 mg total) by mouth every 6 (six) hours as needed for muscle spasms.   30 tablet   0   . omeprazole (PRILOSEC) 20 MG capsule   Oral   Take 1 capsule (20 mg total) by mouth 2 (two) times daily before a meal.   30 capsule   0   . ranitidine (ZANTAC) 150 MG capsule   Oral   Take 1 capsule (150 mg total) by mouth  2 (two) times daily.   28 capsule   0   . sucralfate (CARAFATE) 1 G tablet   Oral   Take 1 tablet (1 g total) by mouth 4 (four) times daily.   120 tablet   1   . sulfamethoxazole-trimethoprim (BACTRIM) 400-80 MG per tablet   Oral   Take 1 tablet by mouth 2 (two) times daily.   6 tablet   0      Allergies Codeine   No family history on file.  Social History Social History  Substance Use Topics  . Smoking status: Current Some Day Smoker -- 0.00 packs/day    Types: Cigarettes  . Smokeless tobacco: Never Used  . Alcohol Use: Yes     Comment: socially    Review of Systems  Constitutional:   No fever or chills. No weight changes Eyes:   No blurry vision or double vision.  ENT:   Positive sore throat. Cardiovascular:   Positive mild as above chest pain. Respiratory:   Positive mild as above dyspnea with nonproductive cough. Gastrointestinal:  Positive upper for abdominal pain, without vomiting and diarrhea.  No BRBPR or melena. Normal oral intake Genitourinary:   Negative for dysuria, urinary retention, bloody urine, or difficulty urinating. Musculoskeletal:   Negative for back pain. No joint swelling or pain. Skin:   Negative for rash. Neurological:   Negative for headaches, focal weakness or numbness. Psychiatric:  No anxiety or depression.   Endocrine:  No hot/cold intolerance, changes in energy, or sleep difficulty.  10-point ROS otherwise negative.  ____________________________________________   PHYSICAL EXAM:  VITAL SIGNS: ED Triage Vitals  Enc Vitals Group     BP 06/21/15 1912 155/97 mmHg     Pulse Rate 06/21/15 1912 87     Resp 06/21/15 1912 18     Temp 06/21/15 1912 98.2 F (36.8 C)     Temp Source 06/21/15 1912 Oral     SpO2 06/21/15 1912 100 %     Weight 06/21/15 1912 220 lb (99.791 kg)     Height 06/21/15 1912  (1.727 m)     Head Cir --      Peak Flow --      Pain Score 06/21/15 1942 4     Pain Loc --      Pain Edu? --      Excl.  in GC? --      Constitutional:   Alert and oriented. Well appearing and in no distress. Very calm and comfortable, listening to music playing on his iPhone and eating snacks Eyes:   No scleral icterus. No conjunctival pallor. PERRL. EOMI ENT   Head:   Normocephalic and atraumatic. TMs with clear effusion bilaterally, no inflammation   Nose:   No congestion/rhinnorhea. No septal hematoma   Mouth/Throat:   MMM, mild pharyngeal erythema. No peritonsillar mass. No uvula shift.   Neck:   No stridor. No SubQ emphysema. No meningismus. Hematological/Lymphatic/Immunilogical:   No cervical lymphadenopathy. Cardiovascular:   RRR. Normal and symmetric distal pulses are present in all extremities. No murmurs, rubs, or gallops. Respiratory:   Normal respiratory effort without tachypnea nor retractions. Breath sounds are clear and equal bilaterally. No wheezes/rales/rhonchi. Gastrointestinal:   Soft and nontender. No distention. There is no CVA tenderness.  No rebound, rigidity, or guarding. Genitourinary:   deferred Musculoskeletal:   Nontender with normal range of motion in all extremities. No joint effusions.  No lower extremity tenderness.  No edema. Neurologic:   Normal speech and language.  CN 2-10 normal. Motor grossly intact. No pronator drift.  Normal gait. No gross focal neurologic deficits are appreciated.  Skin:    Skin is warm, dry and intact. No rash noted.  No petechiae, purpura, or bullae. Psychiatric:   Mood and affect are normal. Speech and behavior are normal. Patient exhibits appropriate insight and judgment.  ____________________________________________    LABS (pertinent positives/negatives) (all labs ordered are listed, but only abnormal results are displayed) Labs Reviewed  BASIC METABOLIC PANEL - Abnormal; Notable for the following:    Chloride 98 (*)    All other components within normal limits  CBC  TROPONIN I    ____________________________________________   EKG  Interpreted by me Sinus rhythm rate of 81, normal axis intervals QRS and ST segments and T waves. There are 2 PVCs on the strip  ____________________________________________    RADIOLOGY  Chest x-ray unremarkable  ____________________________________________   PROCEDURES   ____________________________________________   INITIAL IMPRESSION / ASSESSMENT AND PLAN / ED COURSE  Pertinent labs & imaging results that were available during my  care of the patient were reviewed by me and considered in my medical decision making (see chart for details).  Patient presents with constellation of symptoms which are consistent with a viral upper respiratory infection. EKG labs and chest x-ray were unremarkable. The patient is very nonchalant calm and comfortable and does not. Any distress or concern with his symptoms. I'll give him antacids to help with what appears to be a acid reflux and a one-time dose of Decadron to help with his duration of symptoms.     ____________________________________________   FINAL CLINICAL IMPRESSION(S) / ED DIAGNOSES  Final diagnoses:  URI (upper respiratory infection)  Gastroesophageal reflux disease, esophagitis presence not specified      Sharman Cheek, MD 06/21/15 2119

## 2015-06-21 NOTE — ED Notes (Signed)
Patient transported to X-ray 

## 2015-06-21 NOTE — ED Notes (Addendum)
Pt presents to ED with c/o epigastric/chest pain/SOB, but noted to be eating a bag of Cheetos and asking for something to drink during this RN's initial assessment. Pt is A&O, in NAD, with respirations regular, even, and unlabored. Pt reports non-compliance with his anxiety medications and questions if his s/x's are related.

## 2015-06-21 NOTE — Discharge Instructions (Signed)
Gastroesophageal Reflux Disease, Adult Normally, food travels down the esophagus and stays in the stomach to be digested. However, when a person has gastroesophageal reflux disease (GERD), food and stomach acid move back up into the esophagus. When this happens, the esophagus becomes sore and inflamed. Over time, GERD can create small holes (ulcers) in the lining of the esophagus.  CAUSES This condition is caused by a problem with the muscle between the esophagus and the stomach (lower esophageal sphincter, or LES). Normally, the LES muscle closes after food passes through the esophagus to the stomach. When the LES is weakened or abnormal, it does not close properly, and that allows food and stomach acid to go back up into the esophagus. The LES can be weakened by certain dietary substances, medicines, and medical conditions, including:  Tobacco use.  Pregnancy.  Having a hiatal hernia.  Heavy alcohol use.  Certain foods and beverages, such as coffee, chocolate, onions, and peppermint. RISK FACTORS This condition is more likely to develop in:  People who have an increased body weight.  People who have connective tissue disorders.  People who use NSAID medicines. SYMPTOMS Symptoms of this condition include:  Heartburn.  Difficult or painful swallowing.  The feeling of having a lump in the throat.  Abitter taste in the mouth.  Bad breath.  Having a large amount of saliva.  Having an upset or bloated stomach.  Belching.  Chest pain.  Shortness of breath or wheezing.  Ongoing (chronic) cough or a night-time cough.  Wearing away of tooth enamel.  Weight loss. Different conditions can cause chest pain. Make sure to see your health care provider if you experience chest pain. DIAGNOSIS Your health care provider will take a medical history and perform a physical exam. To determine if you have mild or severe GERD, your health care provider may also monitor how you respond  to treatment. You may also have other tests, including:  An endoscopy toexamine your stomach and esophagus with a small camera.  A test thatmeasures the acidity level in your esophagus.  A test thatmeasures how much pressure is on your esophagus.  A barium swallow or modified barium swallow to show the shape, size, and functioning of your esophagus. TREATMENT The goal of treatment is to help relieve your symptoms and to prevent complications. Treatment for this condition may vary depending on how severe your symptoms are. Your health care provider may recommend:  Changes to your diet.  Medicine.  Surgery. HOME CARE INSTRUCTIONS Diet  Follow a diet as recommended by your health care provider. This may involve avoiding foods and drinks such as:  Coffee and tea (with or without caffeine).  Drinks that containalcohol.  Energy drinks and sports drinks.  Carbonated drinks or sodas.  Chocolate and cocoa.  Peppermint and mint flavorings.  Garlic and onions.  Horseradish.  Spicy and acidic foods, including peppers, chili powder, curry powder, vinegar, hot sauces, and barbecue sauce.  Citrus fruit juices and citrus fruits, such as oranges, lemons, and limes.  Tomato-based foods, such as red sauce, chili, salsa, and pizza with red sauce.  Fried and fatty foods, such as donuts, french fries, potato chips, and high-fat dressings.  High-fat meats, such as hot dogs and fatty cuts of red and white meats, such as rib eye steak, sausage, ham, and bacon.  High-fat dairy items, such as whole milk, butter, and cream cheese.  Eat small, frequent meals instead of large meals.  Avoid drinking large amounts of liquid with your  meals.  Avoid eating meals during the 2-3 hours before bedtime.  Avoid lying down right after you eat.  Do not exercise right after you eat. General Instructions  Pay attention to any changes in your symptoms.  Take over-the-counter and prescription  medicines only as told by your health care provider. Do not take aspirin, ibuprofen, or other NSAIDs unless your health care provider told you to do so.  Do not use any tobacco products, including cigarettes, chewing tobacco, and e-cigarettes. If you need help quitting, ask your health care provider.  Wear loose-fitting clothing. Do not wear anything tight around your waist that causes pressure on your abdomen.  Raise (elevate) the head of your bed 6 inches (15cm).  Try to reduce your stress, such as with yoga or meditation. If you need help reducing stress, ask your health care provider.  If you are overweight, reduce your weight to an amount that is healthy for you. Ask your health care provider for guidance about a safe weight loss goal.  Keep all follow-up visits as told by your health care provider. This is important. SEEK MEDICAL CARE IF:  You have new symptoms.  You have unexplained weight loss.  You have difficulty swallowing, or it hurts to swallow.  You have wheezing or a persistent cough.  Your symptoms do not improve with treatment.  You have a hoarse voice. SEEK IMMEDIATE MEDICAL CARE IF:  You have pain in your arms, neck, jaw, teeth, or back.  You feel sweaty, dizzy, or light-headed.  You have chest pain or shortness of breath.  You vomit and your vomit looks like blood or coffee grounds.  You faint.  Your stool is bloody or black.  You cannot swallow, drink, or eat.   This information is not intended to replace advice given to you by your health care provider. Make sure you discuss any questions you have with your health care provider.   Document Released: 05/25/2005 Document Revised: 05/06/2015 Document Reviewed: 12/10/2014 Elsevier Interactive Patient Education 2016 [employer]  Upper Respiratory Infection, Adult Most upper respiratory infections (URIs) are caused by a virus. A URI affects the nose, throat, and upper air passages. The most common  type of URI is often called "the common cold." HOME CARE   Take medicines only as told by your doctor.  Gargle warm saltwater or take cough drops to comfort your throat as told by your doctor.  Use a warm mist humidifier or inhale steam from a shower to increase air moisture. This may make it easier to breathe.  Drink enough fluid to keep your pee (urine) clear or pale yellow.  Eat soups and other clear broths.  Have a healthy diet.  Rest as needed.  Go back to work when your fever is gone or your doctor says it is okay.  You may need to stay home longer to avoid giving your URI to others.  You can also wear a face mask and wash your hands often to prevent spread of the virus.  Use your inhaler more if you have asthma.  Do not use any tobacco products, including cigarettes, chewing tobacco, or electronic cigarettes. If you need help quitting, ask your doctor. GET HELP IF:  You are getting worse, not better.  Your symptoms are not helped by medicine.  You have chills.  You are getting more short of breath.  You have brown or red mucus.  You have yellow or brown discharge from your nose.  You have pain  in your face, especially when you bend forward.  You have a fever.  You have puffy (swollen) neck glands.  You have pain while swallowing.  You have white areas in the back of your throat. GET HELP RIGHT AWAY IF:   You have very bad or constant:  Headache.  Ear pain.  Pain in your forehead, behind your eyes, and over your cheekbones (sinus pain).  Chest pain.  You have long-lasting (chronic) lung disease and any of the following:  Wheezing.  Long-lasting cough.  Coughing up blood.  A change in your usual mucus.  You have a stiff neck.  You have changes in your:  Vision.  Hearing.  Thinking.  Mood. MAKE SURE YOU:   Understand these instructions.  Will watch your condition.  Will get help right away if you are not doing well or get  worse.   This information is not intended to replace advice given to you by your health care provider. Make sure you discuss any questions you have with your health care provider.   Document Released: 02/01/2008 Document Revised: 12/30/2014 Document Reviewed: 11/20/2013 Elsevier Interactive Patient Education [employer]

## 2015-07-13 ENCOUNTER — Emergency Department
Admission: EM | Admit: 2015-07-13 | Discharge: 2015-07-13 | Disposition: A | Discharge status: Home / Self Care | Payer: Self-pay | Attending: Emergency Medicine | Admitting: Emergency Medicine

## 2015-07-13 ENCOUNTER — Encounter: Payer: Self-pay | Admitting: Emergency Medicine

## 2015-07-13 ENCOUNTER — Emergency Department: Payer: Self-pay

## 2015-07-13 DIAGNOSIS — R109 Unspecified abdominal pain: Secondary | ICD-10-CM | POA: Insufficient documentation

## 2015-07-13 DIAGNOSIS — Z792 Long term (current) use of antibiotics: Secondary | ICD-10-CM | POA: Insufficient documentation

## 2015-07-13 DIAGNOSIS — M7918 Myalgia, other site: Secondary | ICD-10-CM

## 2015-07-13 DIAGNOSIS — F17210 Nicotine dependence, cigarettes, uncomplicated: Secondary | ICD-10-CM | POA: Insufficient documentation

## 2015-07-13 DIAGNOSIS — Z79899 Other long term (current) drug therapy: Secondary | ICD-10-CM | POA: Insufficient documentation

## 2015-07-13 DIAGNOSIS — J40 Bronchitis, not specified as acute or chronic: Secondary | ICD-10-CM

## 2015-07-13 DIAGNOSIS — R079 Chest pain, unspecified: Secondary | ICD-10-CM

## 2015-07-13 DIAGNOSIS — J45909 Unspecified asthma, uncomplicated: Secondary | ICD-10-CM | POA: Insufficient documentation

## 2015-07-13 MED ORDER — SUCRALFATE 1 G PO TABS: 1.0000 g | ORAL_TABLET | Freq: Two times a day (BID) | ORAL | Status: DC

## 2015-07-13 MED ORDER — TRAMADOL HCL 50 MG PO TABS: 50.0000 mg | ORAL_TABLET | Freq: Four times a day (QID) | ORAL | Status: DC | PRN

## 2015-07-13 MED ORDER — GI COCKTAIL ~~LOC~~
30.0000 mL | Freq: Once | ORAL | Status: AC
  Administered 2015-07-13: 30 mL via ORAL
  Filled 2015-07-13: qty 30

## 2015-07-13 MED ORDER — BENZONATATE 100 MG PO CAPS: 100.0000 mg | ORAL_CAPSULE | Freq: Four times a day (QID) | ORAL | Status: DC | PRN

## 2015-07-13 MED ORDER — TRAMADOL HCL 50 MG PO TABS
50.0000 mg | ORAL_TABLET | Freq: Once | ORAL | Status: AC
  Administered 2015-07-13: 50 mg via ORAL
  Filled 2015-07-13: qty 1

## 2015-07-13 NOTE — ED Notes (Signed)
Pt c/o cough for 3 weeks; productive "thick phlegm"; woke around 3am with burning in his chest; pain to mid back; pt smokes a few cigarettes a day; history of asthma

## 2015-07-13 NOTE — ED Notes (Signed)
Patient transported to X-ray 

## 2015-07-13 NOTE — Discharge Instructions (Signed)
Chest Wall Pain °Chest wall pain is pain in or around the bones and muscles of your chest. Sometimes, an injury causes this pain. Sometimes, the cause may not be known. This pain may take several weeks or longer to get better. °HOME CARE INSTRUCTIONS  °Pay attention to any changes in your symptoms. Take these actions to help with your pain:  °· Rest as told by your health care provider.   °· Avoid activities that cause pain. These include any activities that use your chest muscles or your abdominal and side muscles to lift heavy items.    °· If directed, apply ice to the painful area: °· Put ice in a plastic bag. °· Place a towel between your skin and the bag. °· Leave the ice on for 20 minutes, 2-3 times per day. °· Take over-the-counter and prescription medicines only as told by your health care provider. °· Do not use tobacco products, including cigarettes, chewing tobacco, and e-cigarettes. If you need help quitting, ask your health care provider. °· Keep all follow-up visits as told by your health care provider. This is important. °SEEK MEDICAL CARE IF: °· You have a fever. °· Your chest pain becomes worse. °· You have new symptoms. °SEEK IMMEDIATE MEDICAL CARE IF: °· You have nausea or vomiting. °· You feel sweaty or light-headed. °· You have a cough with phlegm (sputum) or you cough up blood. °· You develop shortness of breath. °  °This information is not intended to replace advice given to you by your health care provider. Make sure you discuss any questions you have with your health care provider. °  °Document Released: 08/15/2005 Document Revised: 05/06/2015 Document Reviewed: 11/10/2014 °Elsevier Interactive Patient Education ©2016 [employer] ° °Upper Respiratory Infection, Adult °Most upper respiratory infections (URIs) are a viral infection of the air passages leading to the lungs. A URI affects the nose, throat, and upper air passages. The most common type of URI is nasopharyngitis and is typically  referred to as "the common cold." °URIs run their course and usually go away on their own. Most of the time, a URI does not require medical attention, but sometimes a bacterial infection in the upper airways can follow a viral infection. This is called a secondary infection. Sinus and middle ear infections are common types of secondary upper respiratory infections. °Bacterial pneumonia can also complicate a URI. A URI can worsen asthma and chronic obstructive pulmonary disease (COPD). Sometimes, these complications can require emergency medical care and may be life threatening.  °CAUSES °Almost all URIs are caused by viruses. A virus is a type of germ and can spread from one person to another.  °RISKS FACTORS °You may be at risk for a URI if:  °· You smoke.   °· You have chronic heart or lung disease. °· You have a weakened defense (immune) system.   °· You are very young or very old.   °· You have nasal allergies or asthma. °· You work in crowded or poorly ventilated areas. °· You work in health care facilities or schools. °SIGNS AND SYMPTOMS  °Symptoms typically develop 2-3 days after you come in contact with a cold virus. Most viral URIs last 7-10 days. However, viral URIs from the influenza virus (flu virus) can last 14-18 days and are typically more severe. Symptoms may include:  °· Runny or stuffy (congested) nose.   °· Sneezing.   °· Cough.   °· Sore throat.   °· Headache.   °· Fatigue.   °· Fever.   °· Loss of appetite.   °· Pain   in your forehead, behind your eyes, and over your cheekbones (sinus pain). °· Muscle aches.   °DIAGNOSIS  °Your health care provider may diagnose a URI by: °· Physical exam. °· Tests to check that your symptoms are not due to another condition such as: °¨ Strep throat. °¨ Sinusitis. °¨ Pneumonia. °¨ Asthma. °TREATMENT  °A URI goes away on its own with time. It cannot be cured with medicines, but medicines may be prescribed or recommended to relieve symptoms. Medicines may  help: °· Reduce your fever. °· Reduce your cough. °· Relieve nasal congestion. °HOME CARE INSTRUCTIONS  °· Take medicines only as directed by your health care provider.   °· Gargle warm saltwater or take cough drops to comfort your throat as directed by your health care provider. °· Use a warm mist humidifier or inhale steam from a shower to increase air moisture. This may make it easier to breathe. °· Drink enough fluid to keep your urine clear or pale yellow.   °· Eat soups and other clear broths and maintain good nutrition.   °· Rest as needed.   °· Return to work when your temperature has returned to normal or as your health care provider advises. You may need to stay home longer to avoid infecting others. You can also use a face mask and careful hand washing to prevent spread of the virus. °· Increase the usage of your inhaler if you have asthma.   °· Do not use any tobacco products, including cigarettes, chewing tobacco, or electronic cigarettes. If you need help quitting, ask your health care provider. °PREVENTION  °The best way to protect yourself from getting a cold is to practice good hygiene.  °· Avoid oral or hand contact with people with cold symptoms.   °· Wash your hands often if contact occurs.   °There is no clear evidence that vitamin C, vitamin E, echinacea, or exercise reduces the chance of developing a cold. However, it is always recommended to get plenty of rest, exercise, and practice good nutrition.  °SEEK MEDICAL CARE IF:  °· You are getting worse rather than better.   °· Your symptoms are not controlled by medicine.   °· You have chills. °· You have worsening shortness of breath. °· You have brown or red mucus. °· You have yellow or brown nasal discharge. °· You have pain in your face, especially when you bend forward. °· You have a fever. °· You have swollen neck glands. °· You have pain while swallowing. °· You have white areas in the back of your throat. °SEEK IMMEDIATE MEDICAL CARE IF:   °· You have severe or persistent: °¨ Headache. °¨ Ear pain. °¨ Sinus pain. °¨ Chest pain. °· You have chronic lung disease and any of the following: °¨ Wheezing. °¨ Prolonged cough. °¨ Coughing up blood. °¨ A change in your usual mucus. °· You have a stiff neck. °· You have changes in your: °¨ Vision. °¨ Hearing. °¨ Thinking. °¨ Mood. °MAKE SURE YOU:  °· Understand these instructions. °· Will watch your condition. °· Will get help right away if you are not doing well or get worse. °  °This information is not intended to replace advice given to you by your health care provider. Make sure you discuss any questions you have with your health care provider. °  °Document Released: 02/08/2001 Document Revised: 12/30/2014 Document Reviewed: 11/20/2013 °Elsevier Interactive Patient Education ©2016 [employer] Inc. ° °

## 2015-07-13 NOTE — ED Notes (Signed)
Pt in no apparent distress, watching a movie on cell phone, amb to XR w/o difficulty.  Medicated as ordered.

## 2015-07-13 NOTE — ED Provider Notes (Signed)
[facility] Emergency Department Provider Note  ____________________________________________  Time seen: Approximately 503 AM  I have reviewed the triage vital signs and the nursing notes.   HISTORY  Chief Complaint Chest Pain and Cough    HPI Douglas Singh is a 34 y.o. male who comes into the hospital with the cough. The patient reports that he doesn't cough for 3 weeks. He reports that he has been coughing up thick balls of mucus and phlegm. He was here 3 weeks ago and given a steroid but he reports it did not help her symptoms. The patient woke up tonight he reports a burning in his chest. He also reports he has pain on the left side of his chest and on his back. The patient took 2 Tums but it did not help. Currently the patient is complaining of 4-5 out of 10 pain. The patient reports that the pain is on the left side of his chest. He reports that he's had no sweats no nausea no vomiting. He feels as though his chest is a little sore. The patient also reports he had some mild abdominal pain earlier but nothing currently. The patient came in for evaluation. The patient was seen on 1023 with similar symptoms and evaluated fully I one of the emergency medicine physician.   Past Medical History  Diagnosis Date  . Asthma   . Anxiety   . Reflux   . Panic attacks     There are no active problems to display for this patient.   Past Surgical History  Procedure Laterality Date  . Urethra surgery      Current Outpatient Rx  Name  Route  Sig  Dispense  Refill  . albuterol (PROVENTIL HFA;VENTOLIN HFA) 108 (90 BASE) MCG/ACT inhaler   Inhalation   Inhale 2 puffs into the lungs every 6 (six) hours as needed for wheezing or shortness of breath.   1 Inhaler   2   . benzonatate (TESSALON PERLES) 100 MG capsule   Oral   Take 1 capsule (100 mg total) by mouth every 6 (six) hours as needed for cough.   20 capsule   0   . Cetirizine HCl 10 MG CAPS   Oral   Take  1 capsule (10 mg total) by mouth daily.   30 capsule   3   . clonazePAM (KLONOPIN) 0.5 MG tablet   Oral   Take 0.5 mg by mouth 3 (three) times daily.      0   . ibuprofen (ADVIL,MOTRIN) 800 MG tablet   Oral   Take 1 tablet (800 mg total) by mouth every 8 (eight) hours as needed.   30 tablet   0   . methocarbamol (ROBAXIN) 500 MG tablet   Oral   Take 1 tablet (500 mg total) by mouth every 6 (six) hours as needed for muscle spasms.   30 tablet   0   . omeprazole (PRILOSEC) 20 MG capsule   Oral   Take 1 capsule (20 mg total) by mouth 2 (two) times daily before a meal.   30 capsule   0   . ranitidine (ZANTAC) 150 MG capsule   Oral   Take 1 capsule (150 mg total) by mouth 2 (two) times daily.   28 capsule   0   . sucralfate (CARAFATE) 1 G tablet   Oral   Take 1 tablet (1 g total) by mouth 4 (four) times daily.   120 tablet   1   .  sucralfate (CARAFATE) 1 G tablet   Oral   Take 1 tablet (1 g total) by mouth 2 (two) times daily.   20 tablet   0   . sulfamethoxazole-trimethoprim (BACTRIM) 400-80 MG per tablet   Oral   Take 1 tablet by mouth 2 (two) times daily.   6 tablet   0   . traMADol (ULTRAM) 50 MG tablet   Oral   Take 1 tablet (50 mg total) by mouth every 6 (six) hours as needed.   12 tablet   0     Allergies Codeine  History reviewed. No pertinent family history.  Social History Social History  Substance Use Topics  . Smoking status: Current Some Day Smoker -- 0.00 packs/day    Types: Cigarettes  . Smokeless tobacco: Never Used  . Alcohol Use: Yes     Comment: socially    Review of Systems Constitutional: No fever/chills Eyes: No visual changes. ENT: No sore throat. Cardiovascular: Left-sided rib/chest pain. Respiratory: Cough but Denies shortness of breath. Gastrointestinal:  abdominal pain.  No nausea, no vomiting.  No diarrhea.  No constipation. Genitourinary: Negative for dysuria. Musculoskeletal: Negative for back pain. Skin:  Negative for rash. Neurological: Negative for headaches, focal weakness or numbness.  10-point ROS otherwise negative.  ____________________________________________   PHYSICAL EXAM:  VITAL SIGNS: ED Triage Vitals  Enc Vitals Group     BP 07/13/15 0418 145/77 mmHg     Pulse Rate 07/13/15 0418 57     Resp 07/13/15 0418 18     Temp 07/13/15 0418 98 F (36.7 C)     Temp Source 07/13/15 0418 Oral     SpO2 07/13/15 0418 99 %     Weight --      Height --      Head Cir --      Peak Flow --      Pain Score 07/13/15 0420 5     Pain Loc --      Pain Edu? --      Excl. in GC? --     Constitutional: Alert and oriented. Well appearing and in no acute distress. Eyes: Conjunctivae are normal. PERRL. EOMI. Head: Atraumatic. Nose: No congestion/rhinnorhea. Mouth/Throat: Mucous membranes are moist.  Oropharynx non-erythematous. Cardiovascular: Normal rate, regular rhythm. Grossly normal heart sounds.  Good peripheral circulation. Respiratory: Normal respiratory effort.  No retractions. Lungs CTAB. Gastrointestinal: Soft and nontender. No distention.  Musculoskeletal: No lower extremity tenderness nor edema.   Neurologic:  Normal speech and language.  Skin:  Skin is warm, dry and intact.  Psychiatric: Mood and affect are normal. ____________________________________________   LABS (all labs ordered are listed, but only abnormal results are displayed)  Labs Reviewed - No data to display ____________________________________________  EKG  ED ECG REPORT I, Rebecka Apley, the attending physician, personally viewed and interpreted this ECG.   Date: 07/13/2015  EKG Time: 419  Rate: 56  Rhythm: sinus bradycardia  Axis: normal  Intervals:none  ST&T Change: none  ____________________________________________  RADIOLOGY  Chest x-ray: Normal chest ____________________________________________   PROCEDURES  Procedure(s) performed: None  Critical Care performed:  No  ____________________________________________   INITIAL IMPRESSION / ASSESSMENT AND PLAN / ED COURSE  Pertinent labs & imaging results that were available during my care of the patient were reviewed by me and considered in my medical decision making (see chart for details).  This is a 34 year old male who comes in today with some chest burning and pain on the left side  of his chest and back from coughing for 3 weeks. The patient's chest x-ray does not show any pneumonia. I feel that the patient has bronchitis. He does not have any wheezing or any other symptoms. I gave the patient a GI cocktail and a dose of tramadol. The patient's pain was improved sided discharge the patient to home. As the patient has been evaluated for this pain previously I did not feel that blood work was needed at this time. The patient was sitting on his phone intoxicated and did not look in any severe distress. I did encourage the patient to follow-up with his primary care physician or the acute care clinic for further evaluation. ____________________________________________   FINAL CLINICAL IMPRESSION(S) / ED DIAGNOSES  Final diagnoses:  Bronchitis  Musculoskeletal pain  Chest pain, unspecified chest pain type      Rebecka ApleyAllison P Mell Guia, MD 07/13/15 [phone]

## 2015-07-18 ENCOUNTER — Emergency Department
Admission: EM | Admit: 2015-07-18 | Discharge: 2015-07-18 | Disposition: A | Discharge status: Home / Self Care | Payer: Self-pay | Attending: Emergency Medicine | Admitting: Emergency Medicine

## 2015-07-18 ENCOUNTER — Emergency Department: Payer: Self-pay

## 2015-07-18 ENCOUNTER — Encounter: Payer: Self-pay | Admitting: Emergency Medicine

## 2015-07-18 DIAGNOSIS — R059 Cough, unspecified: Secondary | ICD-10-CM

## 2015-07-18 DIAGNOSIS — R05 Cough: Secondary | ICD-10-CM

## 2015-07-18 DIAGNOSIS — Z79899 Other long term (current) drug therapy: Secondary | ICD-10-CM | POA: Insufficient documentation

## 2015-07-18 DIAGNOSIS — F17210 Nicotine dependence, cigarettes, uncomplicated: Secondary | ICD-10-CM | POA: Insufficient documentation

## 2015-07-18 DIAGNOSIS — J45901 Unspecified asthma with (acute) exacerbation: Secondary | ICD-10-CM | POA: Insufficient documentation

## 2015-07-18 DIAGNOSIS — K219 Gastro-esophageal reflux disease without esophagitis: Secondary | ICD-10-CM | POA: Insufficient documentation

## 2015-07-18 MED ORDER — FAMOTIDINE 20 MG PO TABS
40.0000 mg | ORAL_TABLET | Freq: Once | ORAL | Status: AC
  Administered 2015-07-18: 40 mg via ORAL
  Filled 2015-07-18: qty 2

## 2015-07-18 MED ORDER — GI COCKTAIL ~~LOC~~
30.0000 mL | Freq: Once | ORAL | Status: AC
Start: 2015-07-18 — End: 2015-07-18
  Administered 2015-07-18: 30 mL via ORAL
  Filled 2015-07-18: qty 30

## 2015-07-18 MED ORDER — RANITIDINE HCL 150 MG/10ML PO SYRP: 150.0000 mg | ORAL_SOLUTION | Freq: Once | ORAL | Status: DC

## 2015-07-18 NOTE — ED Notes (Signed)
Patient transported to X-ray 

## 2015-07-18 NOTE — ED Notes (Signed)
Pt reports that ride to take her home at [facility]9AM

## 2015-07-18 NOTE — ED Provider Notes (Signed)
[facility] Emergency Department Provider Note REMINDER - THIS NOTE IS NOT A FINAL MEDICAL RECORD UNTIL IT IS SIGNED. UNTIL THEN, THE CONTENT BELOW MAY REFLECT INFORMATION FROM A DOCUMENTATION TEMPLATE, NOT THE ACTUAL PATIENT VISIT. ____________________________________________  Time seen: Approximately 8:07 AM  I have reviewed the triage vital signs and the nursing notes.   HISTORY  Chief Complaint Shortness of Breath; Emesis; and Heartburn    HPI Douglas Singh is a 34 y.o. male reports to me that he has a history of acid reflux and recent cough.  Patient states he comes for evaluation because he's been having an ongoing slight cough for about the last 3 weeks. In addition, he notes that he occasionally feels slightly short of breath though he does not have any symptoms now. The cough is occasionally productive, but this morning was not productive. In addition, he reports he has occasional burning in his upper abdomen, and states he's been told he has acid reflux but is not currently taking any medications and does not have any "tums". Denies to me these have any chest pain, he currently has no shortness of breath. He is not wheezing. He denies having fevers or chills.  Patient reports he did feel short of breath earlier this morning, but the symptoms have resolved.  Denies any history of blood clots, no leg swelling, no bloody cough, no recent traumas or surgery.   Past Medical History  Diagnosis Date  . Asthma   . Anxiety   . Reflux   . Panic attacks     There are no active problems to display for this patient.   Past Surgical History  Procedure Laterality Date  . Urethra surgery      Current Outpatient Rx  Name  Route  Sig  Dispense  Refill  . albuterol (PROVENTIL HFA;VENTOLIN HFA) 108 (90 BASE) MCG/ACT inhaler   Inhalation   Inhale 2 puffs into the lungs every 6 (six) hours as needed for wheezing or shortness of breath.   1 Inhaler   2   .  benzonatate (TESSALON PERLES) 100 MG capsule   Oral   Take 1 capsule (100 mg total) by mouth every 6 (six) hours as needed for cough.   20 capsule   0   . Cetirizine HCl 10 MG CAPS   Oral   Take 1 capsule (10 mg total) by mouth daily.   30 capsule   3   . clonazePAM (KLONOPIN) 0.5 MG tablet   Oral   Take 0.5 mg by mouth 3 (three) times daily.      0   . ibuprofen (ADVIL,MOTRIN) 800 MG tablet   Oral   Take 1 tablet (800 mg total) by mouth every 8 (eight) hours as needed.   30 tablet   0   . methocarbamol (ROBAXIN) 500 MG tablet   Oral   Take 1 tablet (500 mg total) by mouth every 6 (six) hours as needed for muscle spasms.   30 tablet   0   . omeprazole (PRILOSEC) 20 MG capsule   Oral   Take 1 capsule (20 mg total) by mouth 2 (two) times daily before a meal.   30 capsule   0   . ranitidine (ZANTAC) 150 MG capsule   Oral   Take 1 capsule (150 mg total) by mouth 2 (two) times daily.   28 capsule   0   . sucralfate (CARAFATE) 1 G tablet   Oral   Take 1 tablet (  1 g total) by mouth 4 (four) times daily.   120 tablet   1   . sucralfate (CARAFATE) 1 G tablet   Oral   Take 1 tablet (1 g total) by mouth 2 (two) times daily.   20 tablet   0   . sulfamethoxazole-trimethoprim (BACTRIM) 400-80 MG per tablet   Oral   Take 1 tablet by mouth 2 (two) times daily.   6 tablet   0   . traMADol (ULTRAM) 50 MG tablet   Oral   Take 1 tablet (50 mg total) by mouth every 6 (six) hours as needed.   12 tablet   0     Allergies Codeine  History reviewed. No pertinent family history.  Social History Social History  Substance Use Topics  . Smoking status: Current Some Day Smoker -- 0.00 packs/day    Types: Cigarettes  . Smokeless tobacco: Never Used  . Alcohol Use: Yes     Comment: socially    Review of Systems Constitutional: No fever/chills Eyes: No visual changes. ENT: No sore throat. Cardiovascular: Denies chest pain. Respiratory: See history of present  illness, symptoms now resolved. Gastrointestinal: No abdominal pain.  No nausea, but he does report that he "vomited" once today which sounds more like not really vomiting is much as he sort of spit up once this morning when he woke up which was nonbloody and tasted like "acid".  No diarrhea.  No constipation. Does report occasional "burning" feeling in his upper abdomen but worse when he lays down, better when he sits up, and does go away when he takes Tums in the past but presently does not have any. Genitourinary: Negative for dysuria. Musculoskeletal: Negative for back pain. Skin: Negative for rash. Neurological: Negative for headaches, focal weakness or numbness.  10-point ROS otherwise negative.  ____________________________________________   PHYSICAL EXAM:  VITAL SIGNS: ED Triage Vitals  Enc Vitals Group     BP 07/18/15 0449 159/74 mmHg     Pulse Rate 07/18/15 0449 98     Resp 07/18/15 0449 18     Temp 07/18/15 0449 98.4 F (36.9 C)     Temp Source 07/18/15 0449 Oral     SpO2 07/18/15 0449 98 %     Weight 07/18/15 0449 220 lb (99.791 kg)     Height 07/18/15 0449  (1.727 m)     Head Cir --      Peak Flow --      Pain Score 07/18/15 0450 2     Pain Loc --      Pain Edu? --      Excl. in GC? --    Constitutional: Alert and oriented. Well appearing and in no acute distress. Eyes: Conjunctivae are normal. PERRL. EOMI. Head: Atraumatic. Nose: No congestion/rhinnorhea. Mouth/Throat: Mucous membranes are moist.  Oropharynx non-erythematous. Neck: No stridor.   Cardiovascular: Normal rate, regular rhythm. Grossly normal heart sounds.  Good peripheral circulation. Respiratory: Normal respiratory effort.  No retractions. Lungs CTAB. Gastrointestinal: Soft and nontender. No distention. No abdominal bruits. No CVA tenderness. Musculoskeletal: No lower extremity tenderness nor edema.  No joint effusions. Neurologic:  Normal speech and language. No gross focal neurologic  deficits are appreciated. NSkin:  Skin is warm, dry and intact. No rash noted. Psychiatric: Mood and affect are normal. Speech and behavior are normal.  ____________________________________________   LABS (all labs ordered are listed, but only abnormal results are displayed)  Labs Reviewed - No data to display ____________________________________________  EKG  ED ECG REPORT I, QUALE, MARK, the attending physician, personally viewed and interpreted this ECG.  Date: 07/18/2015 EKG Time: 4:50 AM Rate: 85 Rhythm: normal sinus rhythm QRS Axis: normal Intervals: normal ST/T Wave abnormalities: normal Conduction Disutrbances: none Narrative Interpretation: unremarkable  ____________________________________________  RADIOLOGY  DG Chest 2 View (Final result) Result time: 07/18/15 08:32:44   Final result by Rad Results In Interface (07/18/15 08:32:44)   Narrative:   CLINICAL DATA: Persistent cough for 3 weeks. Shortness breath.  EXAM: CHEST 2 VIEW  COMPARISON: 07/13/2015  FINDINGS: The heart size and mediastinal contours are within normal limits. Both lungs are clear. The visualized skeletal structures are unremarkable.  IMPRESSION: Normal exam, unchanged.     ____________________________________________   PROCEDURES  Procedure(s) performed: None  Critical Care performed: No      Pulmonary Embolism Rule-out Criteria (PERC rule)                        If YES to ANY of the following, the PERC rule is not satisfied and cannot be used to rule out PE in this patient (consider d-dimer or imaging depending on pre-test probability).                      If NO to ALL of the following, AND the clinician's pre-test probability is <15%, the [facility] rule is satisfied and there is no need for further workup (including no need to obtain a d-dimer) as the post-test probability of pulmonary embolism is <2%.                      Mnemonic is HAD CLOTS   H - hormone use  (exogenous estrogen)      No. A - age > 50                                                 No. D - DVT/PE history                                      No.   C - coughing blood (hemoptysis)                 No. L - leg swelling, unilateral                             No. O - O2 Sat on Room Air < 95%                  No. T - tachycardia (HR ? 100)                         No. S - surgery or trauma, recent                      No.   Based on my evaluation of the patient, including application of this decision instrument, further testing to evaluate for pulmonary embolism is not indicated at this time. I have discussed this recommendation with the patient who states understanding and agreement with this plan.  ____________________________________________   INITIAL IMPRESSION / ASSESSMENT AND PLAN /  ED COURSE  Pertinent labs & imaging results that were available during my care of the patient were reviewed by me and considered in my medical decision making (see chart for details).  Patient presents for evaluation of persistent cough for the last 3 weeks. On exam his lungs are clear, his oxygen levels normal, and he is in no distress. Given his ongoing complaint of over 3 weeks of cough I will obtain an x-ray, this is normal Achilles likely suffering some mild bronchitis and find no indication for antibiotic treatment. He has no risk factor for pulmonary emboli, his EKG is normal, he carries no significant risk factors for coronary artery disease and denies any acute chest pain. He does report burning his upper abdomen, but his diagnosis of acid reflux and given the symptomatology described it seems that this is most likely acid reflux. EKG is very reassuring, and I doubt acute cardiac disease.  I did discuss with the patient treatments with PPIs and histamine blockers and discussed and recommended he try over-the-counter Zantac which he states he's been on and did work well but he currently is out.  Advised him that he could purchase over-the-counter, in addition no treat him with GI cocktail here for his acid reflux type symptoms.  I did discuss careful return cautions with the patient likely increasing shortness breath, he develops a fever, bloody cough, severe shortness of breath, wheezing, chest pain, severe abdominal pain, or other new concerns arise to come back to ER.  ----------------------------------------- 8:39 AM on 07/18/2015 -----------------------------------------  Chest x-ray clear. EKG normal. Discussed return precautions with the patient, also recommendations for over-the-counter treatments for acid reflux and follow up with the primary care physician. Patient agreeable. I will discharge him at this time, stable condition no distress. ____________________________________________   FINAL CLINICAL IMPRESSION(S) / ED DIAGNOSES  Final diagnoses:  Cough  Gastroesophageal reflux disease, esophagitis presence not specified      Sharyn CreamerMark Quale, MD 07/18/15 [phone]

## 2015-07-18 NOTE — ED Notes (Signed)
Pt alert and oriented X4, active, cooperative, pt in NAD. RR even and unlabored, color WNL.  Pt informed to return if any life threatening symptoms occur.   

## 2015-07-18 NOTE — Discharge Instructions (Signed)
For your acid reflux, I recommend that you try over-the-counter Zantac or Nexium as directed on the manufactures packaging.  Cough, Adult Coughing is a reflex that clears your throat and your airways. Coughing helps to heal and protect your lungs. It is normal to cough occasionally, but a cough that happens with other symptoms or lasts a long time may be a sign of a condition that needs treatment. A cough may last only 2-3 weeks (acute), or it may last longer than 8 weeks (chronic). CAUSES Coughing is commonly caused by:  Breathing in substances that irritate your lungs.  A viral or bacterial respiratory infection.  Allergies.  Asthma.  Postnasal drip.  Smoking.  Acid backing up from the stomach into the esophagus (gastroesophageal reflux).  Certain medicines.  Chronic lung problems, including COPD (or rarely, lung cancer).  Other medical conditions such as heart failure. HOME CARE INSTRUCTIONS  Pay attention to any changes in your symptoms. Take these actions to help with your discomfort:  Take medicines only as told by your health care provider.  If you were prescribed an antibiotic medicine, take it as told by your health care provider. Do not stop taking the antibiotic even if you start to feel better.  Talk with your health care provider before you take a cough suppressant medicine.  Drink enough fluid to keep your urine clear or pale yellow.  If the air is dry, use a cold steam vaporizer or humidifier in your bedroom or your home to help loosen secretions.  Avoid anything that causes you to cough at work or at home.  If your cough is worse at night, try sleeping in a semi-upright position.  Avoid cigarette smoke. If you smoke, quit smoking. If you need help quitting, ask your health care provider.  Avoid caffeine.  Avoid alcohol.  Rest as needed. SEEK MEDICAL CARE IF:   You have new symptoms.  You cough up pus.  Your cough does not get better after 2-3  weeks, or your cough gets worse.  You cannot control your cough with suppressant medicines and you are losing sleep.  You develop pain that is getting worse or pain that is not controlled with pain medicines.  You have a fever.  You have unexplained weight loss.  You have night sweats. SEEK IMMEDIATE MEDICAL CARE IF:  You cough up blood.  You have difficulty breathing.  Your heartbeat is very fast.   This information is not intended to replace advice given to you by your health care provider. Make sure you discuss any questions you have with your health care provider.   Document Released: 02/11/2011 Document Revised: 05/06/2015 Document Reviewed: 10/22/2014 Elsevier Interactive Patient Education 2016 Else[employer] Inc.  Gastroesophageal Reflux Disease, Adult Normally, food travels down the esophagus and stays in the stomach to be digested. However, when a person has gastroesophageal reflux disease (GERD), food and stomach acid move back up into the esophagus. When this happens, the esophagus becomes sore and inflamed. Over time, GERD can create small holes (ulcers) in the lining of the esophagus.  CAUSES This condition is caused by a problem with the muscle between the esophagus and the stomach (lower esophageal sphincter, or LES). Normally, the LES muscle closes after food passes through the esophagus to the stomach. When the LES is weakened or abnormal, it does not close properly, and that allows food and stomach acid to go back up into the esophagus. The LES can be weakened by certain dietary substances, medicines, and medical  conditions, including:  Tobacco use.  Pregnancy.  Having a hiatal hernia.  Heavy alcohol use.  Certain foods and beverages, such as coffee, chocolate, onions, and peppermint. RISK FACTORS This condition is more likely to develop in:  People who have an increased body weight.  People who have connective tissue disorders.  People who use NSAID  medicines. SYMPTOMS Symptoms of this condition include:  Heartburn.  Difficult or painful swallowing.  The feeling of having a lump in the throat.  Abitter taste in the mouth.  Bad breath.  Having a large amount of saliva.  Having an upset or bloated stomach.  Belching.  Chest pain.  Shortness of breath or wheezing.  Ongoing (chronic) cough or a night-time cough.  Wearing away of tooth enamel.  Weight loss. Different conditions can cause chest pain. Make sure to see your health care provider if you experience chest pain. DIAGNOSIS Your health care provider will take a medical history and perform a physical exam. To determine if you have mild or severe GERD, your health care provider may also monitor how you respond to treatment. You may also have other tests, including:  An endoscopy toexamine your stomach and esophagus with a small camera.  A test thatmeasures the acidity level in your esophagus.  A test thatmeasures how much pressure is on your esophagus.  A barium swallow or modified barium swallow to show the shape, size, and functioning of your esophagus. TREATMENT The goal of treatment is to help relieve your symptoms and to prevent complications. Treatment for this condition may vary depending on how severe your symptoms are. Your health care provider may recommend:  Changes to your diet.  Medicine.  Surgery. HOME CARE INSTRUCTIONS Diet  Follow a diet as recommended by your health care provider. This may involve avoiding foods and drinks such as:  Coffee and tea (with or without caffeine).  Drinks that containalcohol.  Energy drinks and sports drinks.  Carbonated drinks or sodas.  Chocolate and cocoa.  Peppermint and mint flavorings.  Garlic and onions.  Horseradish.  Spicy and acidic foods, including peppers, chili powder, curry powder, vinegar, hot sauces, and barbecue sauce.  Citrus fruit juices and citrus fruits, such as oranges,  lemons, and limes.  Tomato-based foods, such as red sauce, chili, salsa, and pizza with red sauce.  Fried and fatty foods, such as donuts, french fries, potato chips, and high-fat dressings.  High-fat meats, such as hot dogs and fatty cuts of red and white meats, such as rib eye steak, sausage, ham, and bacon.  High-fat dairy items, such as whole milk, butter, and cream cheese.  Eat small, frequent meals instead of large meals.  Avoid drinking large amounts of liquid with your meals.  Avoid eating meals during the 2-3 hours before bedtime.  Avoid lying down right after you eat.  Do not exercise right after you eat. General Instructions  Pay attention to any changes in your symptoms.  Take over-the-counter and prescription medicines only as told by your health care provider. Do not take aspirin, ibuprofen, or other NSAIDs unless your health care provider told you to do so.  Do not use any tobacco products, including cigarettes, chewing tobacco, and e-cigarettes. If you need help quitting, ask your health care provider.  Wear loose-fitting clothing. Do not wear anything tight around your waist that causes pressure on your abdomen.  Raise (elevate) the head of your bed 6 inches (15cm).  Try to reduce your stress, such as with yoga or meditation.  If you need help reducing stress, ask your health care provider.  If you are overweight, reduce your weight to an amount that is healthy for you. Ask your health care provider for guidance about a safe weight loss goal.  Keep all follow-up visits as told by your health care provider. This is important. SEEK MEDICAL CARE IF:  You have new symptoms.  You have unexplained weight loss.  You have difficulty swallowing, or it hurts to swallow.  You have wheezing or a persistent cough.  Your symptoms do not improve with treatment.  You have a hoarse voice. SEEK IMMEDIATE MEDICAL CARE IF:  You have pain in your arms, neck, jaw,  teeth, or back.  You feel sweaty, dizzy, or light-headed.  You have chest pain or shortness of breath.  You vomit and your vomit looks like blood or coffee grounds.  You faint.  Your stool is bloody or black.  You cannot swallow, drink, or eat.   This information is not intended to replace advice given to you by your health care provider. Make sure you discuss any questions you have with your health care provider.   Document Released: 05/25/2005 Document Revised: 05/06/2015 Document Reviewed: 12/10/2014 Elsevier Interactive Patient Education [employer]

## 2015-07-18 NOTE — ED Notes (Signed)
Patient with complaint of shortness of breath that started about an hour ago. Patient reports that he vomited times one. Patient states that he has had some burning in his chest and that it feels like it maybe acid reflux.

## 2015-07-18 NOTE — ED Notes (Signed)
Pt reports nausea, diarrhea that began this AM with SOB upon awakening. Pt reports that all of his sx are gone except the nausea.

## 2015-07-18 NOTE — ED Notes (Signed)
MD at bedside. 

## 2015-07-24 ENCOUNTER — Emergency Department
Admission: EM | Admit: 2015-07-24 | Discharge: 2015-07-24 | Disposition: A | Discharge status: Home / Self Care | Payer: Self-pay | Attending: Emergency Medicine | Admitting: Emergency Medicine

## 2015-07-24 DIAGNOSIS — J45909 Unspecified asthma, uncomplicated: Secondary | ICD-10-CM | POA: Insufficient documentation

## 2015-07-24 DIAGNOSIS — J069 Acute upper respiratory infection, unspecified: Secondary | ICD-10-CM | POA: Insufficient documentation

## 2015-07-24 DIAGNOSIS — B9789 Other viral agents as the cause of diseases classified elsewhere: Secondary | ICD-10-CM

## 2015-07-24 DIAGNOSIS — F17210 Nicotine dependence, cigarettes, uncomplicated: Secondary | ICD-10-CM | POA: Insufficient documentation

## 2015-07-24 DIAGNOSIS — Z79899 Other long term (current) drug therapy: Secondary | ICD-10-CM | POA: Insufficient documentation

## 2015-07-24 LAB — POCT RAPID STREP A: STREPTOCOCCUS, GROUP A SCREEN (DIRECT): NEGATIVE

## 2015-07-24 MED ORDER — BENZONATATE 100 MG PO CAPS: 100.0000 mg | ORAL_CAPSULE | Freq: Three times a day (TID) | ORAL | Status: DC | PRN

## 2015-07-24 NOTE — Discharge Instructions (Signed)
Upper Respiratory Infection, Adult Most upper respiratory infections (URIs) are caused by a virus. A URI affects the nose, throat, and upper air passages. The most common type of URI is often called "the common cold." HOME CARE   Take medicines only as told by your doctor.  Gargle warm saltwater or take cough drops to comfort your throat as told by your doctor.  Use a warm mist humidifier or inhale steam from a shower to increase air moisture. This may make it easier to breathe.  Drink enough fluid to keep your pee (urine) clear or pale yellow.  Eat soups and other clear broths.  Have a healthy diet.  Rest as needed.  Go back to work when your fever is gone or your doctor says it is okay.  You may need to stay home longer to avoid giving your URI to others.  You can also wear a face mask and wash your hands often to prevent spread of the virus.  Use your inhaler more if you have asthma.  Do not use any tobacco products, including cigarettes, chewing tobacco, or electronic cigarettes. If you need help quitting, ask your doctor. GET HELP IF:  You are getting worse, not better.  Your symptoms are not helped by medicine.  You have chills.  You are getting more short of breath.  You have brown or red mucus.  You have yellow or brown discharge from your nose.  You have pain in your face, especially when you bend forward.  You have a fever.  You have puffy (swollen) neck glands.  You have pain while swallowing.  You have white areas in the back of your throat. GET HELP RIGHT AWAY IF:   You have very bad or constant:  Headache.  Ear pain.  Pain in your forehead, behind your eyes, and over your cheekbones (sinus pain).  Chest pain.  You have long-lasting (chronic) lung disease and any of the following:  Wheezing.  Long-lasting cough.  Coughing up blood.  A change in your usual mucus.  You have a stiff neck.  You have changes in  your:  Vision.  Hearing.  Thinking.  Mood. MAKE SURE YOU:   Understand these instructions.  Will watch your condition.  Will get help right away if you are not doing well or get worse.   This information is not intended to replace advice given to you by your health care provider. Make sure you discuss any questions you have with your health care provider.   Document Released: 02/01/2008 Document Revised: 12/30/2014 Document Reviewed: 11/20/2013 Elsevier Interactive Patient Education 2016 [employer]    Begin taking Tessalon as needed for cough. Increase fluids. Tylenol or ibuprofen as needed for aches, headache or pain. [facility] your doctor or [facility] if any continued problems.

## 2015-07-24 NOTE — ED Notes (Signed)
Pt c/o cough with sinus and chest congestion since last night with yellow and green phlegm

## 2015-07-24 NOTE — ED Provider Notes (Signed)
[facility] Emergency Department Provider Note ____________________________________________  Time seen: Approximately 12:25 PM  I have reviewed the triage vital signs and the nursing notes.   HISTORY  Chief Complaint Cough and Sore Throat  HPI Douglas Singh is a 34 y.o. male is here with complaint of nasal congestion and cough since yesterday. Patient states he has not taken any medication for this. He denies any fever or chills. He states he coughed up yellow/green phlegm and has sore throat from coughing.He denies any difficulty with breathing   Past Medical History  Diagnosis Date  . Asthma   . Anxiety   . Reflux   . Panic attacks     There are no active problems to display for this patient.   Past Surgical History  Procedure Laterality Date  . Urethra surgery      Current Outpatient Rx  Name  Route  Sig  Dispense  Refill  . albuterol (PROVENTIL HFA;VENTOLIN HFA) 108 (90 BASE) MCG/ACT inhaler   Inhalation   Inhale 2 puffs into the lungs every 6 (six) hours as needed for wheezing or shortness of breath.   1 Inhaler   2   . benzonatate (TESSALON PERLES) 100 MG capsule   Oral   Take 1 capsule (100 mg total) by mouth 3 (three) times daily as needed for cough.   30 capsule   0   . Cetirizine HCl 10 MG CAPS   Oral   Take 1 capsule (10 mg total) by mouth daily.   30 capsule   3   . clonazePAM (KLONOPIN) 0.5 MG tablet   Oral   Take 0.5 mg by mouth 3 (three) times daily.      0   . ibuprofen (ADVIL,MOTRIN) 800 MG tablet   Oral   Take 1 tablet (800 mg total) by mouth every 8 (eight) hours as needed.   30 tablet   0   . methocarbamol (ROBAXIN) 500 MG tablet   Oral   Take 1 tablet (500 mg total) by mouth every 6 (six) hours as needed for muscle spasms.   30 tablet   0   . omeprazole (PRILOSEC) 20 MG capsule   Oral   Take 1 capsule (20 mg total) by mouth 2 (two) times daily before a meal.   30 capsule   0   . ranitidine (ZANTAC)  150 MG capsule   Oral   Take 1 capsule (150 mg total) by mouth 2 (two) times daily.   28 capsule   0   . sucralfate (CARAFATE) 1 G tablet   Oral   Take 1 tablet (1 g total) by mouth 4 (four) times daily.   120 tablet   1   . sucralfate (CARAFATE) 1 G tablet   Oral   Take 1 tablet (1 g total) by mouth 2 (two) times daily.   20 tablet   0   . sulfamethoxazole-trimethoprim (BACTRIM) 400-80 MG per tablet   Oral   Take 1 tablet by mouth 2 (two) times daily.   6 tablet   0   . traMADol (ULTRAM) 50 MG tablet   Oral   Take 1 tablet (50 mg total) by mouth every 6 (six) hours as needed.   12 tablet   0     Allergies Codeine  No family history on file.  Social History Social History  Substance Use Topics  . Smoking status: Current Some Day Smoker -- 0.00 packs/day    Types: Cigarettes  .  Smokeless tobacco: Never Used  . Alcohol Use: Yes     Comment: socially    Review of Systems Constitutional: No fever/chills Eyes: No visual changes. ENT: Positive sore throat. Cardiovascular: Denies chest pain. Respiratory: Denies shortness of breath. Positive productive cough Gastrointestinal: No abdominal pain.  No nausea, no vomiting.  Musculoskeletal: Negative for back pain. Skin: Negative for rash. Neurological: Negative for headaches, focal weakness or numbness.  10-point ROS otherwise negative.  ____________________________________________   PHYSICAL EXAM:  VITAL SIGNS: ED Triage Vitals  Enc Vitals Group     BP 07/24/15 1126 148/84 mmHg     Pulse Rate 07/24/15 1126 100     Resp 07/24/15 1126 17     Temp 07/24/15 1126 98.3 F (36.8 C)     Temp Source 07/24/15 1126 Oral     SpO2 07/24/15 1126 98 %     Weight 07/24/15 1126 215 lb (97.523 kg)     Height 07/24/15 1126  (1.727 m)     Head Cir --      Peak Flow --      Pain Score 07/24/15 1126 7     Pain Loc --      Pain Edu? --      Excl. in GC? --     Constitutional: Alert and oriented. Well appearing  and in no acute distress. Eyes: Conjunctivae are normal. PERRL. EOMI. Head: Atraumatic. Nose: Mild congestion/no rhinnorhea. Mouth/Throat: Mucous membranes are moist.  Oropharynx non-erythematous. Posterior drainage present. Neck: No stridor.   Hematological/Lymphatic/Immunilogical: No cervical lymphadenopathy. Cardiovascular: Normal rate, regular rhythm. Grossly normal heart sounds.  Good peripheral circulation. Respiratory: Normal respiratory effort.  No retractions. Lungs CTAB. Gastrointestinal: Soft and nontender. No distention. No abdominal bruits. No CVA tenderness. Musculoskeletal: No lower extremity tenderness nor edema.  No joint effusions. Neurologic:  Normal speech and language. No gross focal neurologic deficits are appreciated. No gait instability. Skin:  Skin is warm, dry and intact. No rash noted. Psychiatric: Mood and affect are normal. Speech and behavior are normal.  ____________________________________________   LABS (all labs ordered are listed, but only abnormal results are displayed)  Labs Reviewed  POCT RAPID STREP A    PROCEDURES  Procedure(s) performed: None  Critical Care performed: No  ____________________________________________   INITIAL IMPRESSION / ASSESSMENT AND PLAN / ED COURSE  Pertinent labs & imaging results that were available during my care of the patient were reviewed by me and considered in my medical decision making (see chart for details).  Patient was given a prescription for [facility] as needed for cough. He is encouraged to increase fluids and take Tylenol or ibuprofen as needed for throat pain. Patient was told that everything appears to be viral in nature. He is follow-up with his primary care doctor if any continued problems. ____________________________________________   FINAL CLINICAL IMPRESSION(S) / ED DIAGNOSES  Final diagnoses:  Viral upper respiratory tract infection with cough      Tommi Rumps,  PA-C 07/24/15 1355  Phineas Semen, MD 07/24/15 1359

## 2015-08-10 ENCOUNTER — Emergency Department
Admission: EM | Admit: 2015-08-10 | Discharge: 2015-08-10 | Disposition: A | Discharge status: Home / Self Care | Payer: Self-pay | Attending: Emergency Medicine | Admitting: Emergency Medicine

## 2015-08-10 ENCOUNTER — Encounter: Payer: Self-pay | Admitting: Emergency Medicine

## 2015-08-10 DIAGNOSIS — F17210 Nicotine dependence, cigarettes, uncomplicated: Secondary | ICD-10-CM | POA: Insufficient documentation

## 2015-08-10 DIAGNOSIS — Z79899 Other long term (current) drug therapy: Secondary | ICD-10-CM | POA: Insufficient documentation

## 2015-08-10 DIAGNOSIS — R040 Epistaxis: Secondary | ICD-10-CM | POA: Insufficient documentation

## 2015-08-10 DIAGNOSIS — Z792 Long term (current) use of antibiotics: Secondary | ICD-10-CM | POA: Insufficient documentation

## 2015-08-10 NOTE — ED Provider Notes (Signed)
[facility] Emergency Department Provider Note  ____________________________________________  Time seen: Approximately 1:49 PM  I have reviewed the triage vital signs and the nursing notes.   HISTORY  Chief Complaint Epistaxis  HPI Douglas Singh is a 34 y.o. male is here complained of right-sided nose bleeding this morning which resolved on its on. Later this morning patient states it began again and he put a Kleenex up his nose to stop the bleeding. He states that there is a moderate amount of bleeding and he became concerned about the amount of blood he was losing. He denies any trauma to his nose. He denies any upper respiratory illnesses or blowing his nose. Patient rates his discomfort as 6 out of 10. Currently he is not having any bleeding.He was also seen in November for an upper respiratory infection.   Past Medical History  Diagnosis Date  . Asthma   . Anxiety   . Reflux   . Panic attacks     There are no active problems to display for this patient.   Past Surgical History  Procedure Laterality Date  . Urethra surgery      Current Outpatient Rx  Name  Route  Sig  Dispense  Refill  . albuterol (PROVENTIL HFA;VENTOLIN HFA) 108 (90 BASE) MCG/ACT inhaler   Inhalation   Inhale 2 puffs into the lungs every 6 (six) hours as needed for wheezing or shortness of breath.   1 Inhaler   2   . benzonatate (TESSALON PERLES) 100 MG capsule   Oral   Take 1 capsule (100 mg total) by mouth 3 (three) times daily as needed for cough.   30 capsule   0   . Cetirizine HCl 10 MG CAPS   Oral   Take 1 capsule (10 mg total) by mouth daily.   30 capsule   3   . clonazePAM (KLONOPIN) 0.5 MG tablet   Oral   Take 0.5 mg by mouth 3 (three) times daily.      0   . ibuprofen (ADVIL,MOTRIN) 800 MG tablet   Oral   Take 1 tablet (800 mg total) by mouth every 8 (eight) hours as needed.   30 tablet   0   . methocarbamol (ROBAXIN) 500 MG tablet   Oral   Take  1 tablet (500 mg total) by mouth every 6 (six) hours as needed for muscle spasms.   30 tablet   0   . omeprazole (PRILOSEC) 20 MG capsule   Oral   Take 1 capsule (20 mg total) by mouth 2 (two) times daily before a meal.   30 capsule   0   . ranitidine (ZANTAC) 150 MG capsule   Oral   Take 1 capsule (150 mg total) by mouth 2 (two) times daily.   28 capsule   0   . sucralfate (CARAFATE) 1 G tablet   Oral   Take 1 tablet (1 g total) by mouth 4 (four) times daily.   120 tablet   1   . sucralfate (CARAFATE) 1 G tablet   Oral   Take 1 tablet (1 g total) by mouth 2 (two) times daily.   20 tablet   0   . sulfamethoxazole-trimethoprim (BACTRIM) 400-80 MG per tablet   Oral   Take 1 tablet by mouth 2 (two) times daily.   6 tablet   0   . traMADol (ULTRAM) 50 MG tablet   Oral   Take 1 tablet (50 mg total) by  mouth every 6 (six) hours as needed.   12 tablet   0     Allergies Codeine  No family history on file.  Social History Social History  Substance Use Topics  . Smoking status: Current Some Day Smoker -- 0.00 packs/day    Types: Cigarettes  . Smokeless tobacco: Never Used  . Alcohol Use: Yes     Comment: socially    Review of Systems Constitutional: No fever/chills Eyes: No visual changes. ENT: No sore throat. Positive right-sided nose bleeding Cardiovascular: Denies chest pain. Respiratory: Denies shortness of breath. Gastrointestinal:   No nausea, no vomiting. Skin: Negative for rash. Neurological: Negative for headaches, focal weakness or numbness.  10-point ROS otherwise negative.  ____________________________________________   PHYSICAL EXAM:  VITAL SIGNS: ED Triage Vitals  Enc Vitals Group     BP 08/10/15 1325 142/84 mmHg     Pulse Rate 08/10/15 1325 78     Resp 08/10/15 1325 16     Temp 08/10/15 1325 98.1 F (36.7 C)     Temp Source 08/10/15 1325 Oral     SpO2 08/10/15 1325 99 %     Weight 08/10/15 1325 220 lb (99.791 kg)     Height  08/10/15 1325  (1.753 m)     Head Cir --      Peak Flow --      Pain Score 08/10/15 1326 6     Pain Loc --      Pain Edu? --      Excl. in GC? --     Constitutional: Alert and oriented. Well appearing and in no acute distress. Eyes: Conjunctivae are normal. PERRL. EOMI. Head: Atraumatic.      EACs and TMs are clear bilaterally. Nose: No congestion/rhinnorhea. Left nares without bleeding.  Right nares with mild edema and evidence of anterior bleed that is completely resolved. Area is on and anterior portion of the septum. Mouth/Throat: Mucous membranes are moist.  Oropharynx non-erythematous. No blood noticed on posterior pharynx. Neck: No stridor.   Hematological/Lymphatic/Immunilogical: No cervical lymphadenopathy. Cardiovascular: Normal rate, regular rhythm. Grossly normal heart sounds.  Good peripheral circulation. Respiratory: Normal respiratory effort.  No retractions. Lungs CTAB. Musculoskeletal: Moves upper and lower extremities without any difficulty and normal gait was noted Neurologic:  Normal speech and language. No gross focal neurologic deficits are appreciated. No gait instability. Skin:  Skin is warm, dry and intact. No rash noted. Psychiatric: Mood and affect are normal. Speech and behavior are normal.  ____________________________________________   LABS (all labs ordered are listed, but only abnormal results are displayed)  Labs Reviewed - No data to display  PROCEDURES  Procedure(s) performed: None  Critical Care performed: No  ____________________________________________   INITIAL IMPRESSION / ASSESSMENT AND PLAN / ED COURSE  Pertinent labs & imaging results that were available during my care of the patient were reviewed by me and considered in my medical decision making (see chart for details).  Patient was encouraged to use a humidifier in his home. He is also encouraged to use less she at night to keep from nasal mucosa from drying. He is  instructed not to place any foreign body in his nose and should he develop another nosebleed to apply pressure by pressing his nostrils together and holding for 15 minutes. He is to follow-up with [facility] if any continued problems. ____________________________________________   FINAL CLINICAL IMPRESSION(S) / ED DIAGNOSES  Final diagnoses:  Acute anterior epistaxis  resolved    Tommi Rumps,  PA-C 08/10/15 1405  Minna AntisKevin Paduchowski, MD 08/10/15 [phone]

## 2015-08-10 NOTE — ED Notes (Signed)
Says blood from right nare since this am.  Has tissue in nose now .  No bleeding now.  Says also has place under right chin that is tender.

## 2015-08-10 NOTE — Discharge Instructions (Signed)
Nosebleed Nosebleeds are common. A nosebleed can be caused by many things, including:  Getting hit hard in the nose.  Infections.  Dryness in your nose.  A dry climate.  Medicines.  Picking your nose.  Your home heating and cooling systems. HOME CARE   Try controlling your nosebleed by pinching your nostrils gently. Do this for at least 10 minutes.  Avoid blowing or sniffing your nose for a number of hours after having a nosebleed.  Do not put gauze inside of your nose yourself. If your nose was packed by your doctor, try to keep the pack inside of your nose until your doctor removes it.  If a gauze pack was used and it starts to fall out, gently replace it or cut off the end of it.  If a balloon catheter was used to pack your nose, do not cut or remove it unless told by your doctor.  Avoid lying down while you are having a nosebleed. Sit up and lean forward.  Use a nasal spray decongestant to help with a nosebleed as told by your doctor.  Do not use petroleum jelly or mineral oil in your nose. These can drip into your lungs.  Keep your house humid by using:  Less air conditioning.  A humidifier.  Aspirin and blood thinners make bleeding more likely. If you are prescribed these medicines and you have nosebleeds, ask your doctor if you should stop taking the medicines or adjust the dose. Do not stop medicines unless told by your doctor.  Resume your normal activities as you are able. Avoid straining, lifting, or bending at your waist for several days.  If your nosebleed was caused by dryness in your nose, use over-the-counter saline nasal spray or gel. If you must use a lubricant:  Choose one that is water-soluble.  Use it only as needed.  Do not use it within several hours of lying down.  Keep all follow-up visits as told by your doctor. This is important. GET HELP IF:  You have a fever.  You get frequent nosebleeds.  You are getting nosebleeds more  often. GET HELP RIGHT AWAY IF:  Your nosebleed lasts longer than 20 minutes.  Your nosebleed occurs after an injury to your face, and your nose looks crooked or broken.  You have unusual bleeding from other parts of your body.  You have unusual bruising on other parts of your body.  You feel light-headed or dizzy.  You become sweaty.  You throw up (vomit) blood.  You have a nosebleed after a head injury.   This information is not intended to replace advice given to you by your health care provider. Make sure you discuss any questions you have with your health care provider.   Document Released: 05/24/2008 Document Revised: 09/05/2014 Document Reviewed: 03/31/2014 Elsevier Interactive Patient Education 2016 [employer]    Follow up with Olga your nose and throat if any continued problems. Use humidifier in your house and reduce heat to 68 while sleeping. Do not take any aspirin products or anti-inflammatories.

## 2015-08-10 NOTE — ED Notes (Signed)
AAOx3.  Skin warm and dry.  Ambulates with easy and steady gait. NAD 

## 2015-08-21 ENCOUNTER — Emergency Department
Admission: EM | Admit: 2015-08-21 | Discharge: 2015-08-22 | Disposition: A | Discharge status: Home / Self Care | Payer: Self-pay | Attending: Emergency Medicine | Admitting: Emergency Medicine

## 2015-08-21 ENCOUNTER — Emergency Department: Payer: Self-pay

## 2015-08-21 ENCOUNTER — Encounter: Payer: Self-pay | Admitting: Emergency Medicine

## 2015-08-21 DIAGNOSIS — J45901 Unspecified asthma with (acute) exacerbation: Secondary | ICD-10-CM | POA: Insufficient documentation

## 2015-08-21 DIAGNOSIS — F17210 Nicotine dependence, cigarettes, uncomplicated: Secondary | ICD-10-CM | POA: Insufficient documentation

## 2015-08-21 DIAGNOSIS — R079 Chest pain, unspecified: Secondary | ICD-10-CM

## 2015-08-21 DIAGNOSIS — R42 Dizziness and giddiness: Secondary | ICD-10-CM | POA: Insufficient documentation

## 2015-08-21 DIAGNOSIS — Z79899 Other long term (current) drug therapy: Secondary | ICD-10-CM | POA: Insufficient documentation

## 2015-08-21 LAB — BASIC METABOLIC PANEL
Anion gap: 8 (ref 5–15)
BUN: 12 mg/dL (ref 6–20)
CALCIUM: 10.2 mg/dL (ref 8.9–10.3)
CO2: 33 mmol/L — ABNORMAL HIGH (ref 22–32)
Chloride: 98 mmol/L — ABNORMAL LOW (ref 101–111)
Creatinine, Ser: 0.97 mg/dL (ref 0.61–1.24)
GFR calc Af Amer: >60 mL/min (ref >60)
GLUCOSE: 87 mg/dL (ref 65–99)
Potassium: 3.7 mmol/L (ref 3.5–5.1)
SODIUM: 139 mmol/L (ref 135–145)

## 2015-08-21 LAB — CBC
HCT: 46 % (ref 40.0–52.0)
Hemoglobin: 15.5 g/dL (ref 13.0–18.0)
MCH: 29.4 pg (ref 26.0–34.0)
MCHC: 33.7 g/dL (ref 32.0–36.0)
MCV: 87.1 fL (ref 80.0–100.0)
PLATELETS: 228 10*3/uL (ref 150–440)
RBC: 5.28 MIL/uL (ref 4.40–5.90)
RDW: 13.9 % (ref 11.5–14.5)
WBC: 8.7 10*3/uL (ref 3.8–10.6)

## 2015-08-21 LAB — TROPONIN I

## 2015-08-21 NOTE — ED Notes (Signed)
pt reports chest tightness and headache started approx 1 hour ago

## 2015-08-22 LAB — TROPONIN I

## 2015-08-22 NOTE — ED Provider Notes (Signed)
[facility] Emergency Department Provider Note  ____________________________________________  Time seen: Approximately 12:36 AM  I have reviewed the triage vital signs and the nursing notes.   HISTORY  Chief Complaint Chest Pain    HPI Douglas Singh is a 34 y.o. male who comes in after reporting some chest discomfort. He said chest is been tight intermittently for good part of the day he took Tums initially didn't seem to help much she seemed like gas was moving around and his belly and when some of it came out he felt better shortness of breath going on and then after dinner where he ate some beef tips he got some lightheadedness 2. Does not seem to be worse if he is physically active. The tightness is precordial and again gets better if the gas moves around as he says or if he belches. He also complains of some cough producing brown phlegm. He reports he has been going on for quite some time. He reports he smokes one cigarette 2 or 3 times a week.Says he does not have a doctor.   Past Medical History  Diagnosis Date  . Asthma   . Anxiety   . Reflux   . Panic attacks     There are no active problems to display for this patient.   Past Surgical History  Procedure Laterality Date  . Urethra surgery      Current Outpatient Rx  Name  Route  Sig  Dispense  Refill  . albuterol (PROVENTIL HFA;VENTOLIN HFA) 108 (90 BASE) MCG/ACT inhaler   Inhalation   Inhale 2 puffs into the lungs every 6 (six) hours as needed for wheezing or shortness of breath.   1 Inhaler   2   . clonazePAM (KLONOPIN) 0.5 MG tablet   Oral   Take 0.5 mg by mouth 3 (three) times daily.      0   . ibuprofen (ADVIL,MOTRIN) 800 MG tablet   Oral   Take 1 tablet (800 mg total) by mouth every 8 (eight) hours as needed.   30 tablet   0   . benzonatate (TESSALON PERLES) 100 MG capsule   Oral   Take 1 capsule (100 mg total) by mouth 3 (three) times daily as needed for cough. Patient  not taking: Reported on 08/22/2015   30 capsule   0   . Cetirizine HCl 10 MG CAPS   Oral   Take 1 capsule (10 mg total) by mouth daily. Patient not taking: Reported on 08/22/2015   30 capsule   3   . methocarbamol (ROBAXIN) 500 MG tablet   Oral   Take 1 tablet (500 mg total) by mouth every 6 (six) hours as needed for muscle spasms. Patient not taking: Reported on 08/22/2015   30 tablet   0   . omeprazole (PRILOSEC) 20 MG capsule   Oral   Take 1 capsule (20 mg total) by mouth 2 (two) times daily before a meal. Patient not taking: Reported on 08/22/2015   30 capsule   0   . ranitidine (ZANTAC) 150 MG capsule   Oral   Take 1 capsule (150 mg total) by mouth 2 (two) times daily. Patient not taking: Reported on 08/22/2015   28 capsule   0   . sucralfate (CARAFATE) 1 G tablet   Oral   Take 1 tablet (1 g total) by mouth 4 (four) times daily. Patient not taking: Reported on 08/22/2015   120 tablet   1   .  sucralfate (CARAFATE) 1 G tablet   Oral   Take 1 tablet (1 g total) by mouth 2 (two) times daily. Patient not taking: Reported on 08/22/2015   20 tablet   0   . sulfamethoxazole-trimethoprim (BACTRIM) 400-80 MG per tablet   Oral   Take 1 tablet by mouth 2 (two) times daily. Patient not taking: Reported on 08/22/2015   6 tablet   0   . traMADol (ULTRAM) 50 MG tablet   Oral   Take 1 tablet (50 mg total) by mouth every 6 (six) hours as needed. Patient not taking: Reported on 08/22/2015   12 tablet   0     Allergies Codeine  History reviewed. No pertinent family history.  Social History Social History  Substance Use Topics  . Smoking status: Current Some Day Smoker -- 0.00 packs/day    Types: Cigarettes  . Smokeless tobacco: Never Used  . Alcohol Use: Yes     Comment: socially    Review of Systems Constitutional: No fever/chills Eyes: No visual changes. ENT: No sore throat. Cardiovascular:  chest pain. Respiratory:shortness of  breath. Gastrointestinal: No abdominal pain.  No nausea, no vomiting.  No diarrhea.  No constipation. Genitourinary: Negative for dysuria. Musculoskeletal: Negative for back pain. Skin: Negative for rash. Neurological: Negative for headaches, focal weakness or numbness.  10-point ROS otherwise negative.  ____________________________________________   PHYSICAL EXAM:  VITAL SIGNS: ED Triage Vitals  Enc Vitals Group     BP --      Pulse --      Resp --      Temp --      Temp src --      SpO2 --      Weight --      Height --      Head Cir --      Peak Flow --      Pain Score 08/21/15 2300 0     Pain Loc --      Pain Edu? --      Excl. in GC? --     Constitutional: Alert and oriented. Well appearing and in no acute distress. Eyes: Conjunctivae are normal. PERRL. EOMI. Head: Atraumatic. Nose: No congestion/rhinnorhea. Mouth/Throat: Mucous membranes are moist.  Oropharynx non-erythematous. Neck: No stridor Cardiovascular: Normal rate, regular rhythm. Grossly normal heart sounds.  Good peripheral circulation. Respiratory: Normal respiratory effort.  No retractions. Lungs CTAB. Gastrointestinal: Soft and nontender. No distention. No abdominal bruits. No CVA tenderness. Musculoskeletal: No lower extremity tenderness nor edema.  No joint effusions. Neurologic:  Normal speech and language. No gross focal neurologic deficits are appreciated. No gait instability. Skin:  Skin is warm, dry and intact. No rash  noted.   ____________________________________________   LABS (all labs ordered are listed, but only abnormal results are displayed)  Labs Reviewed  BASIC METABOLIC PANEL - Abnormal; Notable for the following:    Chloride 98 (*)    CO2 33 (*)    All other components within normal limits  CBC  TROPONIN I  TROPONIN I   ____________________________________________  EKG  KG read and interpreted by me shows normal sinus rhythm rate of 72 normal axis normal ST T waves  essentially normal EKG ____________________________________________  RADIOLOGY  Chest x-ray read by radiology reviewed by me shows no acute disease ____________________________________________   PROCEDURES    ____________________________________________   INITIAL IMPRESSION / ASSESSMENT AND PLAN / ED COURSE  Pertinent labs & imaging results that were available during my care of  the patient were reviewed by me and considered in my medical decision making (see chart for details).   ____________________________________________   FINAL CLINICAL IMPRESSION(S) / ED DIAGNOSES  Final diagnoses:  Chest pain, unspecified chest pain type      Arnaldo Natal, MD 08/22/15 [phone]

## 2015-09-20 ENCOUNTER — Emergency Department: Payer: Self-pay

## 2015-09-20 ENCOUNTER — Emergency Department
Admission: EM | Admit: 2015-09-20 | Discharge: 2015-09-20 | Disposition: A | Discharge status: Home / Self Care | Payer: Self-pay | Attending: Emergency Medicine | Admitting: Emergency Medicine

## 2015-09-20 DIAGNOSIS — Z79899 Other long term (current) drug therapy: Secondary | ICD-10-CM | POA: Insufficient documentation

## 2015-09-20 DIAGNOSIS — R21 Rash and other nonspecific skin eruption: Secondary | ICD-10-CM | POA: Insufficient documentation

## 2015-09-20 DIAGNOSIS — R42 Dizziness and giddiness: Secondary | ICD-10-CM | POA: Insufficient documentation

## 2015-09-20 DIAGNOSIS — R002 Palpitations: Secondary | ICD-10-CM | POA: Insufficient documentation

## 2015-09-20 DIAGNOSIS — F17210 Nicotine dependence, cigarettes, uncomplicated: Secondary | ICD-10-CM | POA: Insufficient documentation

## 2015-09-20 DIAGNOSIS — F419 Anxiety disorder, unspecified: Secondary | ICD-10-CM | POA: Insufficient documentation

## 2015-09-20 LAB — COMPREHENSIVE METABOLIC PANEL
ALT: 34 U/L (ref 17–63)
ANION GAP: 7 (ref 5–15)
AST: 27 U/L (ref 15–41)
Albumin: 4.1 g/dL (ref 3.5–5.0)
Alkaline Phosphatase: 42 U/L (ref 38–126)
BUN: 14 mg/dL (ref 6–20)
CHLORIDE: 107 mmol/L (ref 101–111)
CO2: 24 mmol/L (ref 22–32)
CREATININE: 0.65 mg/dL (ref 0.61–1.24)
Calcium: 8.8 mg/dL — ABNORMAL LOW (ref 8.9–10.3)
Glucose, Bld: 89 mg/dL (ref 65–99)
POTASSIUM: 3.7 mmol/L (ref 3.5–5.1)
SODIUM: 138 mmol/L (ref 135–145)
Total Bilirubin: 0.8 mg/dL (ref 0.3–1.2)
Total Protein: 7.1 g/dL (ref 6.5–8.1)

## 2015-09-20 LAB — CBC
HCT: 40.3 % (ref 40.0–52.0)
Hemoglobin: 14.1 g/dL (ref 13.0–18.0)
MCH: 30.1 pg (ref 26.0–34.0)
MCHC: 34.9 g/dL (ref 32.0–36.0)
MCV: 86.4 fL (ref 80.0–100.0)
PLATELETS: 203 10*3/uL (ref 150–440)
RBC: 4.67 MIL/uL (ref 4.40–5.90)
RDW: 13.2 % (ref 11.5–14.5)
WBC: 5.6 10*3/uL (ref 3.8–10.6)

## 2015-09-20 LAB — TROPONIN I

## 2015-09-20 NOTE — Discharge Instructions (Signed)

## 2015-09-20 NOTE — ED Provider Notes (Signed)
[facility] Emergency Department Provider Note  ____________________________________________  Time seen: On arrival  I have reviewed the triage vital signs and the nursing notes.   HISTORY  Chief Complaint Shortness of Breath and Palpitations    HPI Douglas Singh is a 35 y.o. male who presents with complaints of lightheadedness/dizziness that started this morning when he got out of bed. He felt like his heart was beating faster than normal. He denies any chest pain. No fevers or chills. No cough. No recent travel. No calf pain. He also complains of a rash on his back which he admits has been there for several days. He felt like he had to take deep breaths but denies shortness of breath to me     Past Medical History  Diagnosis Date  . Asthma   . Anxiety   . Reflux   . Panic attacks     There are no active problems to display for this patient.   Past Surgical History  Procedure Laterality Date  . Urethra surgery      Current Outpatient Rx  Name  Route  Sig  Dispense  Refill  . albuterol (PROVENTIL HFA;VENTOLIN HFA) 108 (90 BASE) MCG/ACT inhaler   Inhalation   Inhale 2 puffs into the lungs every 6 (six) hours as needed for wheezing or shortness of breath.   1 Inhaler   2   . benzonatate (TESSALON PERLES) 100 MG capsule   Oral   Take 1 capsule (100 mg total) by mouth 3 (three) times daily as needed for cough. Patient not taking: Reported on 08/22/2015   30 capsule   0   . Cetirizine HCl 10 MG CAPS   Oral   Take 1 capsule (10 mg total) by mouth daily. Patient not taking: Reported on 08/22/2015   30 capsule   3   . clonazePAM (KLONOPIN) 0.5 MG tablet   Oral   Take 0.5 mg by mouth 3 (three) times daily.      0   . ibuprofen (ADVIL,MOTRIN) 800 MG tablet   Oral   Take 1 tablet (800 mg total) by mouth every 8 (eight) hours as needed.   30 tablet   0   . methocarbamol (ROBAXIN) 500 MG tablet   Oral   Take 1 tablet (500 mg total) by  mouth every 6 (six) hours as needed for muscle spasms. Patient not taking: Reported on 08/22/2015   30 tablet   0   . omeprazole (PRILOSEC) 20 MG capsule   Oral   Take 1 capsule (20 mg total) by mouth 2 (two) times daily before a meal. Patient not taking: Reported on 08/22/2015   30 capsule   0   . ranitidine (ZANTAC) 150 MG capsule   Oral   Take 1 capsule (150 mg total) by mouth 2 (two) times daily. Patient not taking: Reported on 08/22/2015   28 capsule   0   . sucralfate (CARAFATE) 1 G tablet   Oral   Take 1 tablet (1 g total) by mouth 4 (four) times daily. Patient not taking: Reported on 08/22/2015   120 tablet   1   . sucralfate (CARAFATE) 1 G tablet   Oral   Take 1 tablet (1 g total) by mouth 2 (two) times daily. Patient not taking: Reported on 08/22/2015   20 tablet   0   . sulfamethoxazole-trimethoprim (BACTRIM) 400-80 MG per tablet   Oral   Take 1 tablet by mouth 2 (two) times daily.  Patient not taking: Reported on 08/22/2015   6 tablet   0   . traMADol (ULTRAM) 50 MG tablet   Oral   Take 1 tablet (50 mg total) by mouth every 6 (six) hours as needed. Patient not taking: Reported on 08/22/2015   12 tablet   0     Allergies Codeine  No family history on file.  Social History Social History  Substance Use Topics  . Smoking status: Current Some Day Smoker -- 0.00 packs/day    Types: Cigarettes  . Smokeless tobacco: Never Used  . Alcohol Use: Yes     Comment: socially    Review of Systems  Constitutional: Negative for fever. Eyes: Negative for visual changes. ENT: Negative for sore throat Cardiovascular: Negative for chest pain. Positive for palpitations Respiratory: Negative for shortness of breath. As above  Gastrointestinal: Negative for abdominal pain, vomiting and diarrhea. Genitourinary: Negative for dysuria. Musculoskeletal: Negative for back pain. Skin: Negative for rash. Neurological: Negative for headaches  Psychiatric: Mild  anxiety    ____________________________________________   PHYSICAL EXAM:  VITAL SIGNS: ED Triage Vitals  Enc Vitals Group     BP 09/20/15 0717 125/75 mmHg     Pulse Rate 09/20/15 0717 81     Resp 09/20/15 0717 16     Temp 09/20/15 0717 98.5 F (36.9 C)     Temp Source 09/20/15 0717 Oral     SpO2 09/20/15 0717 97 %     Weight 09/20/15 0717 220 lb (99.791 kg)     Height 09/20/15 0717  (1.727 m)     Head Cir --      Peak Flow --      Pain Score 09/20/15 0746 0     Pain Loc --      Pain Edu? --      Excl. in GC? --      Constitutional: Alert and oriented. Well appearing and in no distress. Eyes: Conjunctivae are normal.  ENT   Head: Normocephalic and atraumatic.   Mouth/Throat: Mucous membranes are moist. Cardiovascular: Normal rate, regular rhythm. Normal and symmetric distal pulses are present in all extremities. No murmurs, rubs, or gallops. Respiratory: Normal respiratory effort without tachypnea nor retractions. Breath sounds are clear and equal bilaterally.  Gastrointestinal: Soft and non-tender in all quadrants. No distention. There is no CVA tenderness. Genitourinary: deferred Musculoskeletal: Nontender with normal range of motion in all extremities. No lower extremity tenderness nor edema. Neurologic:  Normal speech and language. No gross focal neurologic deficits are appreciated. Skin:  Skin is warm, dry and intact. No rash noted. Psychiatric: Mood and affect are normal. Patient exhibits appropriate insight and judgment.  ____________________________________________    LABS (pertinent positives/negatives)  Labs Reviewed  CBC  COMPREHENSIVE METABOLIC PANEL  TROPONIN I    ____________________________________________   EKG  ED ECG REPORT I, Jene Every, the attending physician, personally viewed and interpreted this ECG.  Date: 09/20/2015 EKG Time: 7:26 AM Rate: 83 Rhythm: normal sinus rhythm QRS Axis: normal Intervals: normal ST/T  Wave abnormalities: normal Conduction Disutrbances: none Narrative Interpretation: unremarkable   ____________________________________________    RADIOLOGY I have personally reviewed any xrays that were ordered on this patient: Chest x-ray unremarkable  ____________________________________________   PROCEDURES  Procedure(s) performed: none  Critical Care performed: none  ____________________________________________   INITIAL IMPRESSION / ASSESSMENT AND PLAN / ED COURSE  Pertinent labs & imaging results that were available during my care of the patient were reviewed by me and considered in  my medical decision making (see chart for details).  Patient well-appearing and in no distress. EKG is reassuring. Vital signs are unremarkable. Exam is benign. Review of medical records demonstrates numerous visits for similar complaints in the past with workups have been negative. Regardless we will send cardiac enzymes, CBC CMP, and do a chest x-ray.  Workup is unremarkable and patient feels well. His heart rate is been normal. His blood pressure is normal. His oxygenation is normal. This time I think is appropriate for discharge with outpatient follow-up. Return precautions discussed  ____________________________________________   FINAL CLINICAL IMPRESSION(S) / ED DIAGNOSES  Final diagnoses:  Heart palpitations     Jene Every, MD 09/20/15 1339

## 2015-09-20 NOTE — ED Notes (Signed)
Pt reports SOB and lightheadedness that started this morning. Pt also reports heart palpitations at that time. Denies chest pain. Denies recent cold symptoms.  Pt also c/o of a itchy rash that he first noticed this morning to his back.

## 2015-10-08 ENCOUNTER — Emergency Department
Admission: EM | Admit: 2015-10-08 | Discharge: 2015-10-08 | Disposition: A | Discharge status: Home / Self Care | Payer: Self-pay | Attending: Emergency Medicine | Admitting: Emergency Medicine

## 2015-10-08 DIAGNOSIS — F17210 Nicotine dependence, cigarettes, uncomplicated: Secondary | ICD-10-CM | POA: Insufficient documentation

## 2015-10-08 DIAGNOSIS — F419 Anxiety disorder, unspecified: Secondary | ICD-10-CM | POA: Insufficient documentation

## 2015-10-08 DIAGNOSIS — L259 Unspecified contact dermatitis, unspecified cause: Secondary | ICD-10-CM | POA: Insufficient documentation

## 2015-10-08 MED ORDER — HYDROXYZINE HCL 50 MG PO TABS: 50.0000 mg | ORAL_TABLET | Freq: Three times a day (TID) | ORAL | Status: DC | PRN

## 2015-10-08 MED ORDER — DEXAMETHASONE SODIUM PHOSPHATE 10 MG/ML IJ SOLN
10.0000 mg | Freq: Once | INTRAMUSCULAR | Status: AC
  Administered 2015-10-08: 10 mg via INTRAMUSCULAR
  Filled 2015-10-08: qty 1

## 2015-10-08 MED ORDER — HYDROXYZINE HCL 50 MG PO TABS
50.0000 mg | ORAL_TABLET | Freq: Once | ORAL | Status: AC
Start: 2015-10-08 — End: 2015-10-08
  Administered 2015-10-08: 50 mg via ORAL
  Filled 2015-10-08: qty 1

## 2015-10-08 MED ORDER — TRIAMCINOLONE ACETONIDE 0.025 % EX OINT: 1.0000 "application " | TOPICAL_OINTMENT | Freq: Two times a day (BID) | CUTANEOUS | Status: DC

## 2015-10-08 MED ORDER — METHYLPREDNISOLONE 4 MG PO TBPK: ORAL_TABLET | ORAL | Status: DC

## 2015-10-08 NOTE — Discharge Instructions (Signed)

## 2015-10-08 NOTE — ED Notes (Signed)
Pt reports to ED w/ c/o rash that started yesterday

## 2015-10-08 NOTE — ED Provider Notes (Signed)
[facility] Emergency Department Provider Note  ____________________________________________  Time seen: Approximately 9:02 PM  I have reviewed the triage vital signs and the nursing notes.   HISTORY  Chief Complaint Rash    HPI Douglas Singh is a 34 y.o. male patient complaining of a rash posterior and left lateral neck to days. Patient state intense itching. Patient denies use of any new personal hygiene products, laundry products, or jewelry. Patient stated there is itching and is taken no palliative measures for this complaint. Patient rates his pain discomfort as a 1/10.   Past Medical History  Diagnosis Date  . Asthma   . Anxiety   . Reflux   . Panic attacks     There are no active problems to display for this patient.   Past Surgical History  Procedure Laterality Date  . Urethra surgery      Current Outpatient Rx  Name  Route  Sig  Dispense  Refill  . albuterol (PROVENTIL HFA;VENTOLIN HFA) 108 (90 BASE) MCG/ACT inhaler   Inhalation   Inhale 2 puffs into the lungs every 6 (six) hours as needed for wheezing or shortness of breath.   1 Inhaler   2   . benzonatate (TESSALON PERLES) 100 MG capsule   Oral   Take 1 capsule (100 mg total) by mouth 3 (three) times daily as needed for cough. Patient not taking: Reported on 08/22/2015   30 capsule   0   . Cetirizine HCl 10 MG CAPS   Oral   Take 1 capsule (10 mg total) by mouth daily. Patient not taking: Reported on 08/22/2015   30 capsule   3   . clonazePAM (KLONOPIN) 0.5 MG tablet   Oral   Take 0.5 mg by mouth 3 (three) times daily as needed for anxiety.       0   . hydrOXYzine (ATARAX/VISTARIL) 50 MG tablet   Oral   Take 1 tablet (50 mg total) by mouth 3 (three) times daily as needed for itching.   15 tablet   0   . ibuprofen (ADVIL,MOTRIN) 800 MG tablet   Oral   Take 1 tablet (800 mg total) by mouth every 8 (eight) hours as needed.   30 tablet   0   . methocarbamol  (ROBAXIN) 500 MG tablet   Oral   Take 1 tablet (500 mg total) by mouth every 6 (six) hours as needed for muscle spasms. Patient not taking: Reported on 08/22/2015   30 tablet   0   . methylPREDNISolone (MEDROL DOSEPAK) 4 MG TBPK tablet      Take Tapered dose as directed   21 tablet   0   . omeprazole (PRILOSEC) 20 MG capsule   Oral   Take 1 capsule (20 mg total) by mouth 2 (two) times daily before a meal. Patient not taking: Reported on 08/22/2015   30 capsule   0   . ranitidine (ZANTAC) 150 MG capsule   Oral   Take 1 capsule (150 mg total) by mouth 2 (two) times daily. Patient not taking: Reported on 08/22/2015   28 capsule   0   . sucralfate (CARAFATE) 1 G tablet   Oral   Take 1 tablet (1 g total) by mouth 4 (four) times daily. Patient not taking: Reported on 08/22/2015   120 tablet   1   . sucralfate (CARAFATE) 1 G tablet   Oral   Take 1 tablet (1 g total) by mouth 2 (two) times  daily. Patient not taking: Reported on 08/22/2015   20 tablet   0   . sulfamethoxazole-trimethoprim (BACTRIM) 400-80 MG per tablet   Oral   Take 1 tablet by mouth 2 (two) times daily. Patient not taking: Reported on 08/22/2015   6 tablet   0   . traMADol (ULTRAM) 50 MG tablet   Oral   Take 1 tablet (50 mg total) by mouth every 6 (six) hours as needed. Patient not taking: Reported on 08/22/2015   12 tablet   0   . triamcinolone (KENALOG) 0.025 % ointment   Topical   Apply 1 application topically 2 (two) times daily.   30 g   0     Allergies Codeine  No family history on file.  Social History Social History  Substance Use Topics  . Smoking status: Current Some Day Smoker -- 0.00 packs/day    Types: Cigarettes  . Smokeless tobacco: Never Used  . Alcohol Use: Yes     Comment: socially    Review of Systems Constitutional: No fever/chills Eyes: No visual changes.ositive ENT: No sore throat. Cardiovascular: Denies chest pain. Respiratory: Denies shortness of  breath. Gastrointestinal: No abdominal pain.  No nausea, no vomiting.  No diarrhea.  No constipation. Genitourinary: Negative for dysuria. Musculoskeletal: Negative for back pain. Skin: Positive for rash. Neurological: Negative for headaches, focal weakness or numbness. Psychiatric:Anxiety Allergic/Immunilogical: Codeine  10-point ROS otherwise negative.  ____________________________________________   PHYSICAL EXAM:  VITAL SIGNS: ED Triage Vitals  Enc Vitals Group     BP 10/08/15 2052 138/84 mmHg     Pulse Rate 10/08/15 2052 73     Resp 10/08/15 2052 16     Temp 10/08/15 2052 98 F (36.7 C)     Temp Source 10/08/15 2052 Oral     SpO2 10/08/15 2052 97 %     Weight 10/08/15 2052 220 lb (99.791 kg)     Height 10/08/15 2052  (1.727 m)     Head Cir --      Peak Flow --      Pain Score 10/08/15 2055 1     Pain Loc --      Pain Edu? --      Excl. in GC? --     Constitutional: Alert and oriented. Well appearing and in no acute distress. Eyes: Conjunctivae are normal. PERRL. EOMI. Head: Atraumatic. Nose: No congestion/rhinnorhea. Mouth/Throat: Mucous membranes are moist.  Oropharynx non-erythematous. Neck: No stridor.  No cervical spine tenderness to palpation. Hematological/Lymphatic/Immunilogical: No cervical lymphadenopathy. Cardiovascular: Normal rate, regular rhythm. Grossly normal heart sounds.  Good peripheral circulation. Respiratory: Normal respiratory effort.  No retractions. Lungs CTAB. Gastrointestinal: Soft and nontender. No distention. No abdominal bruits. No CVA tenderness. Musculoskeletal: No lower extremity tenderness nor edema.  No joint effusions. Neurologic:  Normal speech and language. No gross focal neurologic deficits are appreciated. No gait instability. Skin:  Skin is warm, dry and intact. No rash noted. Not erythematous papular lesion posterior neck and left lateral aspect of the neck. Signs of excoriation but no signs symptoms secondary  infection. Psychiatric: Mood and affect are normal. Speech and behavior are normal.  ____________________________________________   LABS (all labs ordered are listed, but only abnormal results are displayed)  Labs Reviewed - No data to display ____________________________________________  EKG   ____________________________________________  RADIOLOGY   ____________________________________________   PROCEDURES  Procedure(s) performed: None  Critical Care performed: No  ____________________________________________   INITIAL IMPRESSION / ASSESSMENT AND PLAN / ED COURSE  Pertinent labs & imaging results that were available during my care of the patient were reviewed by me and considered in my medical decision making (see chart for details).  Contact dermatitis. Patient given a prescription for Medrol Dosepak, Kenalog, and Atarax. Patient advised to follow-up with dermatology if condition persists. ____________________________________________   FINAL CLINICAL IMPRESSION(S) / ED DIAGNOSES  Final diagnoses:  Contact dermatitis      Joni Reining, PA-C 10/08/15 2107  Joni Reining, PA-C 10/08/15 2109  Jennye Moccasin, MD 10/08/15 2156

## 2015-10-17 ENCOUNTER — Encounter: Payer: Self-pay | Admitting: Emergency Medicine

## 2015-10-17 ENCOUNTER — Emergency Department
Admission: EM | Admit: 2015-10-17 | Discharge: 2015-10-17 | Disposition: A | Discharge status: Home / Self Care | Payer: Self-pay | Attending: Emergency Medicine | Admitting: Emergency Medicine

## 2015-10-17 DIAGNOSIS — K2970 Gastritis, unspecified, without bleeding: Secondary | ICD-10-CM | POA: Insufficient documentation

## 2015-10-17 DIAGNOSIS — F419 Anxiety disorder, unspecified: Secondary | ICD-10-CM | POA: Insufficient documentation

## 2015-10-17 DIAGNOSIS — Z79899 Other long term (current) drug therapy: Secondary | ICD-10-CM | POA: Insufficient documentation

## 2015-10-17 DIAGNOSIS — F17210 Nicotine dependence, cigarettes, uncomplicated: Secondary | ICD-10-CM | POA: Insufficient documentation

## 2015-10-17 MED ORDER — ONDANSETRON HCL 4 MG PO TABS: 4.0000 mg | ORAL_TABLET | Freq: Every day | ORAL | Status: DC | PRN

## 2015-10-17 MED ORDER — GI COCKTAIL ~~LOC~~
ORAL | Status: AC
  Administered 2015-10-17: 30 mL
  Filled 2015-10-17: qty 30

## 2015-10-17 NOTE — ED Notes (Signed)
Pt states started vomiting after eating a chicken sandwich from [employer] yesterday. Has abd pain since vomiting in midepigastric and now is burping and feeling an acid sensation in throat. Last vomited at 5pm then ate scrambled eggs to "help settle his stomach". Daughter had same virus 3 days ago.

## 2015-10-17 NOTE — ED Notes (Addendum)
Pt states N/V/D started yesterday at work.  Vomiting x2 today and Diarrhea x3-4 today.  Pt able to drink and keep some liquids down. C/o pain under left breast near epigastric region as well that started this afternoon.

## 2015-10-17 NOTE — ED Provider Notes (Signed)
[facility] Emergency Department Provider Note  ____________________________________________   I have reviewed the triage vital signs and the nursing notes.   HISTORY  Chief Complaint Emesis and Diarrhea    HPI Douglas Singh is a 35 y.o. male who presents with complaints ofnausea vomiting and diarrhea which started yesterday. He reports he is feeling significantly better today. He still feels that he needs to be checked out. He no longer has abdominal pain. He denies sick contacts. He thinks his symptoms were related to eating a chicken sandwich from [employer]. Vomitus was nonbilious and nonbloody      Past Medical History  Diagnosis Date  . Asthma   . Anxiety   . Reflux   . Panic attacks     There are no active problems to display for this patient.   Past Surgical History  Procedure Laterality Date  . Urethra surgery      Current Outpatient Rx  Name  Route  Sig  Dispense  Refill  . albuterol (PROVENTIL HFA;VENTOLIN HFA) 108 (90 BASE) MCG/ACT inhaler   Inhalation   Inhale 2 puffs into the lungs every 6 (six) hours as needed for wheezing or shortness of breath.   1 Inhaler   2   . benzonatate (TESSALON PERLES) 100 MG capsule   Oral   Take 1 capsule (100 mg total) by mouth 3 (three) times daily as needed for cough. Patient not taking: Reported on 08/22/2015   30 capsule   0   . Cetirizine HCl 10 MG CAPS   Oral   Take 1 capsule (10 mg total) by mouth daily. Patient not taking: Reported on 08/22/2015   30 capsule   3   . clonazePAM (KLONOPIN) 0.5 MG tablet   Oral   Take 0.5 mg by mouth 3 (three) times daily as needed for anxiety.       0   . hydrOXYzine (ATARAX/VISTARIL) 50 MG tablet   Oral   Take 1 tablet (50 mg total) by mouth 3 (three) times daily as needed for itching.   15 tablet   0   . ibuprofen (ADVIL,MOTRIN) 800 MG tablet   Oral   Take 1 tablet (800 mg total) by mouth every 8 (eight) hours as needed.   30 tablet    0   . methocarbamol (ROBAXIN) 500 MG tablet   Oral   Take 1 tablet (500 mg total) by mouth every 6 (six) hours as needed for muscle spasms. Patient not taking: Reported on 08/22/2015   30 tablet   0   . methylPREDNISolone (MEDROL DOSEPAK) 4 MG TBPK tablet      Take Tapered dose as directed   21 tablet   0   . omeprazole (PRILOSEC) 20 MG capsule   Oral   Take 1 capsule (20 mg total) by mouth 2 (two) times daily before a meal. Patient not taking: Reported on 08/22/2015   30 capsule   0   . ranitidine (ZANTAC) 150 MG capsule   Oral   Take 1 capsule (150 mg total) by mouth 2 (two) times daily. Patient not taking: Reported on 08/22/2015   28 capsule   0   . sucralfate (CARAFATE) 1 G tablet   Oral   Take 1 tablet (1 g total) by mouth 4 (four) times daily. Patient not taking: Reported on 08/22/2015   120 tablet   1   . sucralfate (CARAFATE) 1 G tablet   Oral   Take 1 tablet (1 g  total) by mouth 2 (two) times daily. Patient not taking: Reported on 08/22/2015   20 tablet   0   . sulfamethoxazole-trimethoprim (BACTRIM) 400-80 MG per tablet   Oral   Take 1 tablet by mouth 2 (two) times daily. Patient not taking: Reported on 08/22/2015   6 tablet   0   . traMADol (ULTRAM) 50 MG tablet   Oral   Take 1 tablet (50 mg total) by mouth every 6 (six) hours as needed. Patient not taking: Reported on 08/22/2015   12 tablet   0   . triamcinolone (KENALOG) 0.025 % ointment   Topical   Apply 1 application topically 2 (two) times daily.   30 g   0     Allergies Codeine  History reviewed. No pertinent family history.  Social History Social History  Substance Use Topics  . Smoking status: Current Some Day Smoker -- 0.00 packs/day    Types: Cigarettes  . Smokeless tobacco: Never Used  . Alcohol Use: Yes     Comment: socially    Review of Systems  Constitutional: Negative for fever. Eyes: Negative for visual changes. ENT: Negative for sore  throat Cardiovascular: Negative for chest pain. Respiratory: Negative for shortness of breath. Gastrointestinal: As above Genitourinary: Negative for dysuria. Musculoskeletal: Negative for back pain. Skin: Negative for rash. Neurological: Negative for headaches  Psychiatric: Positive for anxiety    ____________________________________________   PHYSICAL EXAM:  VITAL SIGNS: ED Triage Vitals  Enc Vitals Group     BP 10/17/15 1712 156/86 mmHg     Pulse Rate 10/17/15 1712 73     Resp 10/17/15 1712 18     Temp 10/17/15 1712 98.6 F (37 C)     Temp Source 10/17/15 1712 Oral     SpO2 10/17/15 1712 97 %     Weight 10/17/15 1712 205 lb (92.987 kg)     Height 10/17/15 1712  (1.727 m)     Head Cir --      Peak Flow --      Pain Score 10/17/15 1713 8     Pain Loc --      Pain Edu? --      Excl. in GC? --      Constitutional: Alert and oriented. Well appearing and in no distress. Eyes: Conjunctivae are normal.  ENT   Head: Normocephalic and atraumatic.   Mouth/Throat: Mucous membranes are moist. Cardiovascular: Normal rate, regular rhythm. Normal and symmetric distal pulses are present in all extremities.  Respiratory: Normal respiratory effort without tachypnea nor retractions.  Gastrointestinal: Soft and non-tender in all quadrants. No distention.  Genitourinary: deferred Musculoskeletal: Nontender with normal range of motion in all extremities. No lower extremity tenderness nor edema. Neurologic:  Normal speech and language. No gross focal neurologic deficits are appreciated. Skin:  Skin is warm, dry and intact. No rash noted. Psychiatric: Mood and affect are normal. Patient exhibits appropriate insight and judgment.  ____________________________________________    LABS (pertinent positives/negatives)  Labs Reviewed - No data to display  ____________________________________________   EKG  ED ECG REPORT I, Jene Every, the attending physician,  personally viewed and interpreted this ECG.  Date: 10/17/2015 EKG Time: 5:21 PM Rate: 77 Rhythm: normal sinus rhythm QRS Axis: normal Intervals: normal ST/T Wave abnormalities: normal Conduction Disturbances: none Narrative Interpretation: unremarkable   ____________________________________________    RADIOLOGY I have personally reviewed any xrays that were ordered on this patient: None  ____________________________________________   PROCEDURES  Procedure(s) performed: none  Critical Care performed: none  ____________________________________________   INITIAL IMPRESSION / ASSESSMENT AND PLAN / ED COURSE  Pertinent labs & imaging results that were available during my care of the patient were reviewed by me and considered in my medical decision making (see chart for details).  Patient well-appearing and in no distress. Vital signs are normal. His exam is benign.His symptoms abated. Do not feel labs are necessary. Recommend supportive care. Follow-up with PCP as needed. Return precautions discussed.  ____________________________________________   FINAL CLINICAL IMPRESSION(S) / ED DIAGNOSES  Final diagnoses:  Gastritis     Jene Every, MD 10/18/15 0003

## 2015-10-17 NOTE — Discharge Instructions (Signed)

## 2015-11-15 ENCOUNTER — Encounter: Payer: Self-pay | Admitting: *Deleted

## 2015-11-15 DIAGNOSIS — M545 Low back pain: Secondary | ICD-10-CM | POA: Insufficient documentation

## 2015-11-15 DIAGNOSIS — F17210 Nicotine dependence, cigarettes, uncomplicated: Secondary | ICD-10-CM | POA: Insufficient documentation

## 2015-11-15 NOTE — ED Notes (Signed)
Patient states he has had low back pain that radiates to upper right buttocks that began last week, improved and then came back.

## 2015-11-16 ENCOUNTER — Emergency Department
Admission: EM | Admit: 2015-11-16 | Discharge: 2015-11-16 | Disposition: A | Discharge status: Home / Self Care | Payer: Self-pay | Attending: Emergency Medicine | Admitting: Emergency Medicine

## 2015-11-30 ENCOUNTER — Emergency Department
Admission: EM | Admit: 2015-11-30 | Discharge: 2015-11-30 | Disposition: A | Discharge status: Home / Self Care | Payer: Self-pay | Attending: Emergency Medicine | Admitting: Emergency Medicine

## 2015-11-30 DIAGNOSIS — Z791 Long term (current) use of non-steroidal anti-inflammatories (NSAID): Secondary | ICD-10-CM | POA: Insufficient documentation

## 2015-11-30 DIAGNOSIS — K219 Gastro-esophageal reflux disease without esophagitis: Secondary | ICD-10-CM | POA: Insufficient documentation

## 2015-11-30 DIAGNOSIS — Z792 Long term (current) use of antibiotics: Secondary | ICD-10-CM | POA: Insufficient documentation

## 2015-11-30 DIAGNOSIS — J45909 Unspecified asthma, uncomplicated: Secondary | ICD-10-CM | POA: Insufficient documentation

## 2015-11-30 DIAGNOSIS — Z7952 Long term (current) use of systemic steroids: Secondary | ICD-10-CM | POA: Insufficient documentation

## 2015-11-30 DIAGNOSIS — F17210 Nicotine dependence, cigarettes, uncomplicated: Secondary | ICD-10-CM | POA: Insufficient documentation

## 2015-11-30 DIAGNOSIS — R300 Dysuria: Secondary | ICD-10-CM | POA: Insufficient documentation

## 2015-11-30 LAB — URINALYSIS COMPLETE WITH MICROSCOPIC (ARMC ONLY)
BACTERIA UA: NONE SEEN
Bilirubin Urine: NEGATIVE
Glucose, UA: NEGATIVE mg/dL
Hgb urine dipstick: NEGATIVE
KETONES UR: NEGATIVE mg/dL
Leukocytes, UA: NEGATIVE
NITRITE: NEGATIVE
PROTEIN: NEGATIVE mg/dL
SPECIFIC GRAVITY, URINE: 1.021 (ref 1.005–1.030)
pH: 5 (ref 5.0–8.0)

## 2015-11-30 MED ORDER — TAMSULOSIN HCL 0.4 MG PO CAPS: 0.4000 mg | ORAL_CAPSULE | Freq: Every day | ORAL | Status: DC

## 2015-11-30 NOTE — Discharge Instructions (Signed)
Dysuria Dysuria is pain or discomfort while urinating. The pain or discomfort may be felt in the tube that carries urine out of the bladder (urethra) or in the surrounding tissue of the genitals. The pain may also be felt in the groin area, lower abdomen, and lower back. You may have to urinate frequently or have the sudden feeling that you have to urinate (urgency). Dysuria can affect both men and women, but is more common in women. Dysuria can be caused by many different things, including:  Urinary tract infection in women.  Infection of the kidney or bladder.  Kidney stones or bladder stones.  Certain sexually transmitted infections (STIs), such as chlamydia.  Dehydration.  Inflammation of the vagina.  Use of certain medicines.  Use of certain soaps or scented products that cause irritation. HOME CARE INSTRUCTIONS Watch your dysuria for any changes. The following actions may help to reduce any discomfort you are feeling:  Drink enough fluid to keep your urine clear or pale yellow.  Empty your bladder often. Avoid holding urine for long periods of time.  After a bowel movement or urination, women should cleanse from front to back, using each tissue only once.  Empty your bladder after sexual intercourse.  Take medicines only as directed by your health care provider.  If you were prescribed an antibiotic medicine, finish it all even if you start to feel better.  Avoid caffeine, tea, and alcohol. They can irritate the bladder and make dysuria worse. In men, alcohol may irritate the prostate.  Keep all follow-up visits as directed by your health care provider. This is important.  If you had any tests done to find the cause of dysuria, it is your responsibility to obtain your test results. Ask the lab or department performing the test when and how you will get your results. Talk with your health care provider if you have any questions about your results. SEEK MEDICAL CARE  IF:  You develop pain in your back or sides.  You have a fever.  You have nausea or vomiting.  You have blood in your urine.  You are not urinating as often as you usually do. SEEK IMMEDIATE MEDICAL CARE IF:  You pain is severe and not relieved with medicines.  You are unable to hold down any fluids.  You or someone else notices a change in your mental function.  You have a rapid heartbeat at rest.  You have shaking or chills.  You feel extremely weak.   This information is not intended to replace advice given to you by your health care provider. Make sure you discuss any questions you have with your health care provider.   Document Released: 05/13/2004 Document Revised: 09/05/2014 Document Reviewed: 04/10/2014 Elsevier Interactive Patient Education [employer]2016 Elsevier Inc.   Your urine lab was normal today. Continue to increase fluid intake and empty your bladder on schedule. Take the prescription med as directed. Follow-up with urology for continued symptoms. Return to the ED for blood in urine or difficulty passing urine.

## 2015-11-30 NOTE — ED Notes (Signed)
Pt arrives to ER via POV c/o lower pelvic pain after urinating for the past 2 days. Pt went to water park on Saturday, unsure if related. Pain only after urination. Pt alert and oriented X4, active, cooperative, pt in NAD. RR even and unlabored, color WNL.

## 2015-12-01 ENCOUNTER — Emergency Department
Admission: EM | Admit: 2015-12-01 | Discharge: 2015-12-01 | Disposition: A | Discharge status: Home / Self Care | Payer: Self-pay | Attending: Emergency Medicine | Admitting: Emergency Medicine

## 2015-12-01 ENCOUNTER — Emergency Department: Payer: Self-pay

## 2015-12-01 ENCOUNTER — Encounter: Payer: Self-pay | Admitting: Emergency Medicine

## 2015-12-01 DIAGNOSIS — F17210 Nicotine dependence, cigarettes, uncomplicated: Secondary | ICD-10-CM | POA: Insufficient documentation

## 2015-12-01 DIAGNOSIS — N433 Hydrocele, unspecified: Secondary | ICD-10-CM | POA: Insufficient documentation

## 2015-12-01 DIAGNOSIS — N5089 Other specified disorders of the male genital organs: Secondary | ICD-10-CM

## 2015-12-01 DIAGNOSIS — J45909 Unspecified asthma, uncomplicated: Secondary | ICD-10-CM | POA: Insufficient documentation

## 2015-12-01 NOTE — Discharge Instructions (Signed)
Hydrocele, Adult  A hydrocele is a collection of fluid in the loose pouch of skin that holds the testicles (scrotum). Usually, it affects only one testicle.  CAUSES  This condition may be caused by:  · An injury to the scrotum.  · An infection.  · A tumor or cancer of the testicle.  · Twisting of a testicle.  · Decreased blood flow to the scrotum.  SYMPTOMS  A hydrocele feels like a water-filled balloon. It may also feel heavy. A hydrocele can cause:  · Swelling of the scrotum. The swelling may decrease when you lie down.  · Swelling of the groin.  · Mild discomfort in the scrotum.  · Pain. This can develop if the hydrocele was caused by infection or twisting.  DIAGNOSIS  This condition may be diagnosed with a medical history, physical exam, and imaging tests. You may also have blood and urine tests to check for infection.  TREATMENT  Treatment may include:  · Watching and waiting, particularly if the hydrocele causes no symptoms.  · Treatment of the underlying condition. This may include using antibiotic medicine.  · Surgery to drain the fluid. Some surgical options include:    Needle aspiration. For this procedure, a needle is used to drain fluid.    Hydrocelectomy. For this procedure, an incision is made in the scrotum to remove the fluid sac.  HOME CARE INSTRUCTIONS  · Keep all follow-up visits as told by your health care provider. This is important.  · Watch the hydrocele for any changes.  · Take over-the-counter and prescription medicines only as told by your health care provider.  · If you were prescribed an antibiotic medicine, use it as told by your health care provider. Do not stop using the antibiotic even if your condition improves.  SEEK MEDICAL CARE IF:  · The swelling in your scrotum or groin gets worse.  · The hydrocele becomes red, firm, tender to the touch, or painful.  · You notice any changes in the hydrocele.  · You have a fever.     This information is not intended to replace advice given to  you by your health care provider. Make sure you discuss any questions you have with your health care provider.     Document Released: 02/02/2010 Document Revised: 12/30/2014 Document Reviewed: 08/11/2014  Elsevier Interactive Patient Education ©2016 Elsevier Inc.

## 2015-12-01 NOTE — ED Provider Notes (Signed)
CSN: [id number]     Arrival date & time 11/30/15  1620 History   First MD Initiated Contact with Patient 11/30/15 1705     Chief Complaint  Patient presents with  . Dysuria   HPI 35 year old male, who is made to the ED, presents today for evaluation of dysuria for the past 2 days. The patient describes onset Saturday, with pain after urination. He denies any penile discharge, hematuria, hesitation, or frequency. He describes the dull pain to the lower pelvic region only after he passes urine, and it only happens intermittently. He denies any sexual contact or concern for STDs. He also notes some low back pain that radiates on the right side primarily of his buttock. This pain was somewhat improved last week. He denies any recent injury, trauma, or accident. 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 He reports that the pressure he feels in the bladder and penis is similar to what he experienced when he had narrowing of his urethra. He denies any fevers, chills, sweats, nausea, vomiting, or dizziness. He rates his pain to the lateral penis as mild, but notes some discomfort in the buttocks at a 9/10 in triage.  Past Medical History  Diagnosis Date  . Asthma   . Anxiety   . Reflux   . Panic attacks    Past Surgical History  Procedure Laterality Date  . Urethra surgery     No family history on file. Social History  Substance Use Topics  . Smoking status: Current Some Day Smoker -- 0.00 packs/day    Types: Cigarettes  . Smokeless tobacco: Never Used  . Alcohol Use: Yes     Comment: socially    Review of Systems  Constitutional: Negative.   Respiratory: Negative.   Cardiovascular: Negative.   Gastrointestinal: Negative.   Genitourinary: Positive for dysuria and penile pain. Negative for urgency, frequency, hematuria, flank pain, decreased urine volume, discharge, penile swelling, scrotal swelling, enuresis, difficulty urinating,  genital sores and testicular pain.  Musculoskeletal: Positive for back pain.      Allergies  Codeine  Home Medications   Prior to Admission medications   Medication Sig Start Date End Date Taking? Authorizing Provider  albuterol (PROVENTIL HFA;VENTOLIN HFA) 108 (90 BASE) MCG/ACT inhaler Inhale 2 puffs into the lungs every 6 (six) hours as needed for wheezing or shortness of breath. 06/11/15   Chinita Pesterari B Triplett, FNP  benzonatate (TESSALON PERLES) 100 MG capsule Take 1 capsule (100 mg total) by mouth 3 (three) times daily as needed for cough. Patient not taking: Reported on 08/22/2015 07/24/15 07/23/16  Tommi Rumpshonda L Summers, PA-C  Cetirizine HCl 10 MG CAPS Take 1 capsule (10 mg total) by mouth daily. Patient not taking: Reported on 08/22/2015 06/11/15   Chinita Pesterari B Triplett, FNP  clonazePAM (KLONOPIN) 0.5 MG tablet Take 0.5 mg by mouth 3 (three) times daily as needed for anxiety.  11/29/14   Historical Provider, MD  hydrOXYzine (ATARAX/VISTARIL) 50 MG tablet Take 1 tablet (50 mg total) by mouth 3 (three) times daily as needed for itching. 10/08/15   Joni Reiningonald K Smith, PA-C  ibuprofen (ADVIL,MOTRIN) 800 MG tablet Take 1 tablet (800 mg total) by mouth every 8 (eight) hours as needed. 02/06/15   Charmayne Sheerharles M Beers, PA-C  methocarbamol (ROBAXIN) 500 MG tablet Take 1 tablet (500 mg total) by mouth every 6 (six) hours as needed for muscle spasms. Patient not taking: Reported on 08/22/2015 02/06/15   Evangeline Dakinharles M Beers,  PA-C  methylPREDNISolone (MEDROL DOSEPAK) 4 MG TBPK tablet Take Tapered dose as directed 10/08/15   Joni Reining, PA-C  omeprazole (PRILOSEC) 20 MG capsule Take 1 capsule (20 mg total) by mouth 2 (two) times daily before a meal. Patient not taking: Reported on 08/22/2015 02/06/15 02/06/16  Charmayne Sheer Beers, PA-C  ondansetron (ZOFRAN) 4 MG tablet Take 1 tablet (4 mg total) by mouth daily as needed for nausea or vomiting. 10/17/15   Jene Every, MD  ranitidine (ZANTAC) 150 MG capsule Take 1 capsule (150 mg  total) by mouth 2 (two) times daily. Patient not taking: Reported on 08/22/2015 06/21/15   Sharman Cheek, MD  sucralfate (CARAFATE) 1 G tablet Take 1 tablet (1 g total) by mouth 4 (four) times daily. Patient not taking: Reported on 08/22/2015 06/21/15   Sharman Cheek, MD  sucralfate (CARAFATE) 1 G tablet Take 1 tablet (1 g total) by mouth 2 (two) times daily. Patient not taking: Reported on 08/22/2015 07/13/15 07/12/16  Rebecka Apley, MD  sulfamethoxazole-trimethoprim (BACTRIM) 400-80 MG per tablet Take 1 tablet by mouth 2 (two) times daily. Patient not taking: Reported on 08/22/2015 03/19/15   Renford Dills, NP  tamsulosin [facility]) 0.4 MG CAPS capsule Take 1 capsule (0.4 mg total) by mouth daily after breakfast. 11/30/15   Charlesetta Ivory Satchel Heidinger, PA-C  traMADol (ULTRAM) 50 MG tablet Take 1 tablet (50 mg total) by mouth every 6 (six) hours as needed. Patient not taking: Reported on 08/22/2015 07/13/15   Rebecka Apley, MD  triamcinolone (KENALOG) 0.025 % ointment Apply 1 application topically 2 (two) times daily. 10/08/15   Joni Reining, PA-C   BP 124/70 mmHg  Pulse 78  Temp(Src) 99 F (37.2 C) (Oral)  Resp 16  Ht  (1.727 m)  Wt 95.255 kg  BMI 31.94 kg/m2  SpO2 98% Physical Exam  Constitutional: He is oriented to person, place, and time. He appears well-developed and well-nourished.  HENT:  Head: Normocephalic and atraumatic.  Eyes: EOM are normal. Pupils are equal, round, and reactive to light.  Cardiovascular: Normal rate, regular rhythm and normal heart sounds.   Pulmonary/Chest: Effort normal and breath sounds normal.  Abdominal: Soft. Bowel sounds are normal. He exhibits no distension and no mass. There is no tenderness. There is no rebound and no guarding.  Genitourinary:  Deferred  Neurological: He is alert and oriented to person, place, and time.  Skin: Skin is warm and dry.    ED Course  Procedures (including critical care time) Labs Review Labs  Reviewed  URINALYSIS COMPLETEWITH MICROSCOPIC ([facility] ONLY) - Abnormal; Notable for the following:    Color, Urine YELLOW (*)    APPearance CLEAR (*)    Squamous Epithelial / LPF 0-5 (*)    All other components within normal limits   Imaging Review No results found.    EKG Interpretation None      MDM   Final diagnoses:  Dysuria    Patient with an exam and urinalysis that did not indicate an acute urethritis or cystitis. Patient's history is somewhat concerning for some urethral stenosis. He will be discharged with a prescription for Flomax to dose as needed for the next 10 days. He is referred to urology for further evaluation management. He is encouraged to return to the ED for acute urinary retention, gross hematuria, or pelvic pain.    Charlesetta Ivory [place], PA-C 12/01/15 0047  Jeanmarie Plant, MD 12/04/15 1900

## 2015-12-01 NOTE — ED Notes (Addendum)
Pt reports here yesterday for pelvic pain with urination; awoke this morning with left testicular pain/swelling; pt denies dysuria or difficulty voiding, st pelvic pain only immed after voiding

## 2015-12-01 NOTE — ED Provider Notes (Signed)
[facility] Emergency Department Provider Note  ____________________________________________    I have reviewed the triage vital signs and the nursing notes.   HISTORY  Chief Complaint Testicle Pain    HPI Douglas Singh is a 35 y.o. male who presents with complaints of mild left testicular discomfort. He denies dysuria. He denies penile discharge. He reports his recent check for STDs. Seen in emergency arm yesterday and had a normal urine. No difficulty urinating. No fevers chills. No swelling or redness of scrotum.     Past Medical History  Diagnosis Date  . Asthma   . Anxiety   . Reflux   . Panic attacks     There are no active problems to display for this patient.   Past Surgical History  Procedure Laterality Date  . Urethra surgery      Current Outpatient Rx  Name  Route  Sig  Dispense  Refill  . albuterol (PROVENTIL HFA;VENTOLIN HFA) 108 (90 BASE) MCG/ACT inhaler   Inhalation   Inhale 2 puffs into the lungs every 6 (six) hours as needed for wheezing or shortness of breath.   1 Inhaler   2   . benzonatate (TESSALON PERLES) 100 MG capsule   Oral   Take 1 capsule (100 mg total) by mouth 3 (three) times daily as needed for cough. Patient not taking: Reported on 08/22/2015   30 capsule   0   . Cetirizine HCl 10 MG CAPS   Oral   Take 1 capsule (10 mg total) by mouth daily. Patient not taking: Reported on 08/22/2015   30 capsule   3   . clonazePAM (KLONOPIN) 0.5 MG tablet   Oral   Take 0.5 mg by mouth 3 (three) times daily as needed for anxiety.       0   . hydrOXYzine (ATARAX/VISTARIL) 50 MG tablet   Oral   Take 1 tablet (50 mg total) by mouth 3 (three) times daily as needed for itching.   15 tablet   0   . ibuprofen (ADVIL,MOTRIN) 800 MG tablet   Oral   Take 1 tablet (800 mg total) by mouth every 8 (eight) hours as needed.   30 tablet   0   . methocarbamol (ROBAXIN) 500 MG tablet   Oral   Take 1 tablet (500 mg  total) by mouth every 6 (six) hours as needed for muscle spasms. Patient not taking: Reported on 08/22/2015   30 tablet   0   . methylPREDNISolone (MEDROL DOSEPAK) 4 MG TBPK tablet      Take Tapered dose as directed   21 tablet   0   . omeprazole (PRILOSEC) 20 MG capsule   Oral   Take 1 capsule (20 mg total) by mouth 2 (two) times daily before a meal. Patient not taking: Reported on 08/22/2015   30 capsule   0   . ondansetron (ZOFRAN) 4 MG tablet   Oral   Take 1 tablet (4 mg total) by mouth daily as needed for nausea or vomiting.   20 tablet   1   . ranitidine (ZANTAC) 150 MG capsule   Oral   Take 1 capsule (150 mg total) by mouth 2 (two) times daily. Patient not taking: Reported on 08/22/2015   28 capsule   0   . sucralfate (CARAFATE) 1 G tablet   Oral   Take 1 tablet (1 g total) by mouth 4 (four) times daily. Patient not taking: Reported on 08/22/2015   120  tablet   1   . sucralfate (CARAFATE) 1 G tablet   Oral   Take 1 tablet (1 g total) by mouth 2 (two) times daily. Patient not taking: Reported on 08/22/2015   20 tablet   0   . sulfamethoxazole-trimethoprim (BACTRIM) 400-80 MG per tablet   Oral   Take 1 tablet by mouth 2 (two) times daily. Patient not taking: Reported on 08/22/2015   6 tablet   0   . tamsulosin (FLOMAX) 0.4 MG CAPS capsule   Oral   Take 1 capsule (0.4 mg total) by mouth daily after breakfast.   20 capsule   0   . traMADol (ULTRAM) 50 MG tablet   Oral   Take 1 tablet (50 mg total) by mouth every 6 (six) hours as needed. Patient not taking: Reported on 08/22/2015   12 tablet   0   . triamcinolone (KENALOG) 0.025 % ointment   Topical   Apply 1 application topically 2 (two) times daily.   30 g   0     Allergies Codeine  No family history on file.  Social History Social History  Substance Use Topics  . Smoking status: Current Some Day Smoker -- 0.00 packs/day    Types: Cigarettes  . Smokeless tobacco: Never Used  .  Alcohol Use: Yes     Comment: socially    Review of Systems  Constitutional: Negative for fever. Eyes: Negative for discharge ENT: Negative for sore throat Gastrointestinal: Negative for abdominal pain Genitourinary: Negative for dysuria. Mild scrotal pain as above Musculoskeletal: Negative for back pain. Skin: Negative for rash.  Psychiatric: no anxiety    ____________________________________________   PHYSICAL EXAM:  VITAL SIGNS: ED Triage Vitals  Enc Vitals Group     BP 12/01/15 0602 142/89 mmHg     Pulse Rate 12/01/15 0602 61     Resp 12/01/15 0602 18     Temp 12/01/15 0602 98.3 F (36.8 C)     Temp Source 12/01/15 0602 Oral     SpO2 12/01/15 0602 98 %     Weight 12/01/15 0602 210 lb (95.255 kg)     Height 12/01/15 0602  (1.727 m)     Head Cir --      Peak Flow --      Pain Score 12/01/15 0611 8     Pain Loc --      Pain Edu? --      Excl. in GC? --      Constitutional: Alert and oriented. Well appearing and in no distress.  Eyes: Conjunctivae are normal. No erythema or injection ENT   Head: Normocephalic and atraumatic.   Mouth/Throat: Mucous membranes are moist. Cardiovascular: Normal rate, regular rhythm.  Respiratory: Normal respiratory effort without tachypnea nor retractions.  Gastrointestinal: Soft and non-tender in all quadrants. No distention. There is no CVA tenderness. Genitourinary: No scrotal swelling, no scrotal tenderness to palpation, testicles are normal and nontender. No penile discharge. No rash. No erythema no groin lymphadenopathy Musculoskeletal: Nontender with normal range of motion in all extremities.  Neurologic:  Normal speech and language. No gross focal neurologic deficits are appreciated. Skin:  Skin is warm, dry and intact. No rash noted. Psychiatric: Mood and affect are normal. Patient exhibits appropriate insight and judgment.  ____________________________________________    LABS (pertinent  positives/negatives)  Labs Reviewed - No data to display  ____________________________________________   EKG  None  ____________________________________________    RADIOLOGY  Ultrasound testicle shows bilateral hydroceles, otherwise unremarkable  ____________________________________________   PROCEDURES  Procedure(s) performed: none  Critical Care performed: none  ____________________________________________   INITIAL IMPRESSION / ASSESSMENT AND PLAN / ED COURSE  Pertinent labs & imaging results that were available during my care of the patient were reviewed by me and considered in my medical decision making (see chart for details).  Ultrasound shows hydroceles, no evidence of torsion. Urinalysis from yesterday reviewed and is normal. Recommend follow-up with urology, return precautions discussed  ____________________________________________   FINAL CLINICAL IMPRESSION(S) / ED DIAGNOSES  Final diagnoses:  Hydrocele, bilateral          Jene Everyobert Genise Strack, MD 12/01/15 [phone]

## 2015-12-01 NOTE — ED Notes (Signed)
Pt complains of left groin pain only after urinating, pt denies any other symptoms

## 2015-12-02 ENCOUNTER — Encounter: Payer: Self-pay | Admitting: Urology

## 2015-12-03 ENCOUNTER — Encounter: Payer: Self-pay | Admitting: Urology

## 2015-12-03 ENCOUNTER — Ambulatory Visit (INDEPENDENT_AMBULATORY_CARE_PROVIDER_SITE_OTHER): Payer: Self-pay | Admitting: Urology

## 2015-12-03 VITALS — BP 136/81 | HR 90 | Ht 68.0 in | Wt 219.0 lb

## 2015-12-03 DIAGNOSIS — Z87448 Personal history of other diseases of urinary system: Secondary | ICD-10-CM

## 2015-12-03 DIAGNOSIS — R300 Dysuria: Secondary | ICD-10-CM

## 2015-12-03 DIAGNOSIS — F410 Panic disorder [episodic paroxysmal anxiety] without agoraphobia: Secondary | ICD-10-CM | POA: Insufficient documentation

## 2015-12-03 LAB — MICROSCOPIC EXAMINATION
Bacteria, UA: NONE SEEN
Epithelial Cells (non renal): NONE SEEN /hpf (ref 0–10)
RBC, UA: NONE SEEN /hpf (ref 0–?)
WBC UA: NONE SEEN /HPF (ref 0–?)

## 2015-12-03 LAB — URINALYSIS, COMPLETE
Bilirubin, UA: NEGATIVE
GLUCOSE, UA: NEGATIVE
KETONES UA: NEGATIVE
Leukocytes, UA: NEGATIVE
NITRITE UA: NEGATIVE
PROTEIN UA: NEGATIVE
RBC, UA: NEGATIVE
SPEC GRAV UA: 1.02 (ref 1.005–1.030)
UUROB: 0.2 mg/dL (ref 0.2–1.0)
pH, UA: 5 (ref 5.0–7.5)

## 2015-12-03 LAB — BLADDER SCAN AMB NON-IMAGING

## 2015-12-03 MED ORDER — DIAZEPAM 10 MG PO TABS: 10.0000 mg | ORAL_TABLET | Freq: Once | ORAL | Status: DC

## 2015-12-03 NOTE — Progress Notes (Signed)
12/03/2015 11:39 AM   Douglas Singh 1981/05/02 [id number]  Referring provider: No referring provider defined for this encounter.  Chief Complaint  Patient presents with  . Testicle Pain    New Patient  . Dysuria    HPI: 35 year old male who presents today for follow-up from an ED visit 2 days ago. He presented to the emergency room with left testicular discomfort.  He was also seen in the ED 3 days prior to that with dysuria and aching in his penile shaft for about 30-45 min after voiding. UA 2 was negative.   Further workup with a scrotal ultrasound showed mild bilateral hydroceles, left greater than right but otherwise negative. He did have a 1 cm spermatocele on the right.    He was was given flomax by the ED which has helped some with his pain.  He does report recent STD testing which was negative per patient report (tested at the health department 1 month ago).  He is sexually active with his longtime girlfriend.  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      He did have a urethral surgery at age 15 in [place] after which he wore a catheter for 6-8 weeks post op. 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    His urinary stream is OK but does have some post void dribbling.  He denies urinary urgency or frequency.  Nocturia 0-1x.  No gross hematuria or UTIs.     PMH: Past Medical History  Diagnosis Date  . Asthma   . Anxiety   . Reflux   . Panic attacks     Surgical History: Past Surgical History  Procedure Laterality Date  . Urethra surgery  2000?    stricture?    Home Medications:    Medication List       This list is accurate as of: 12/03/15 11:39 AM.  Always use your most recent med list.               albuterol 108 (90 Base) MCG/ACT inhaler  Commonly known as:  PROVENTIL HFA;VENTOLIN HFA  Inhale into the lungs every 6 (six) hours as needed for wheezing or shortness  of breath.     clonazePAM 0.5 MG tablet  Commonly known as:  KLONOPIN  Take 0.5 mg by mouth 3 (three) times daily as needed for anxiety.     tamsulosin 0.4 MG Caps capsule  Commonly known as:  FLOMAX  Take 1 capsule (0.4 mg total) by mouth daily after breakfast.        Allergies:  Allergies  Allergen Reactions  . Codeine Itching    Family History: Family History  Problem Relation Age of Onset  . Bladder Cancer Neg Hx   . Kidney cancer Neg Hx   . Prostate cancer Neg Hx     Social History:  reports that he has been smoking Cigarettes.  He has been smoking about 0.00 packs per day. He has never used smokeless tobacco. He reports that he drinks alcohol. He reports that he does not use illicit drugs.  ROS: UROLOGY Frequent Urination?: No Hard to postpone urination?: No Burning/pain with urination?: Yes Get up at night to urinate?: No Leakage of urine?: No Urine stream starts and stops?: No Trouble starting stream?: No Do you have to strain to urinate?: Yes Blood in urine?: No Urinary tract infection?: No Sexually transmitted disease?: No Injury to kidneys or  bladder?: No Painful intercourse?: No Weak stream?: No Erection problems?: No Penile pain?: No  Gastrointestinal Nausea?: No Vomiting?: No Indigestion/heartburn?: Yes Diarrhea?: No Constipation?: No  Constitutional Fever: No Night sweats?: No Weight loss?: No Fatigue?: No  Skin Skin rash/lesions?: No Itching?: No  Eyes Blurred vision?: No Double vision?: No  Ears/Nose/Throat Sore throat?: No Sinus problems?: Yes  Hematologic/Lymphatic Swollen glands?: No Easy bruising?: No  Cardiovascular Leg swelling?: No Chest pain?: No  Respiratory Cough?: Yes Shortness of breath?: Yes  Endocrine Excessive thirst?: No  Musculoskeletal Back pain?: No Joint pain?: No  Neurological Headaches?: No Dizziness?: No  Psychologic Depression?: No Anxiety?: No  Physical Exam: BP 136/81 mmHg   Pulse 90  Ht 5\' 8"  (1.727 m)  Wt 219 lb (99.338 kg)  BMI 33.31 kg/m2  Constitutional:  Alert and oriented, No acute distress. HEENT: [place] AT, moist mucus membranes.  Trachea midline, no masses. Cardiovascular: No clubbing, cyanosis, or edema. Respiratory: Normal respiratory effort, no increased work of breathing. GI: Abdomen is soft, nontender, nondistended, no abdominal masses GU: No CVA tenderness. Normal scrotum with bilateral descended testicles, nontender. Mild bilateral hydroceles which were fairly unremarkable. Penile shaft circumcised with circumcision scar visible. On the ventral left aspect of the phallus near the circumcision scar, there is a circumferential scar which appears to be a possible flap. Orthotopic meatus without discharge. Perineum is inspected without evidence of perineal incision scar.   Skin: No rashes, bruises or suspicious lesions. Lymph: No cervical or inguinal adenopathy. Neurologic: Grossly intact, no focal deficits, moving all 4 extremities. Psychiatric: Normal mood and affect.  Laboratory Data: Lab Results  Component Value Date   WBC 5.6 09/20/2015   HGB 14.1 09/20/2015   HCT 40.3 09/20/2015   MCV 86.4 09/20/2015   PLT 203 09/20/2015    Lab Results  Component Value Date   CREATININE 0.65 09/20/2015   Urinalysis Results for orders placed or performed in visit on 12/03/15  Microscopic Examination  Result Value Ref Range   WBC, UA None seen 0 -  5 /hpf   RBC, UA None seen 0 -  2 /hpf   Epithelial Cells (non renal) None seen 0 - 10 /hpf   Bacteria, UA None seen None seen/Few  Urinalysis, Complete  Result Value Ref Range   Specific Gravity, UA 1.020 1.005 - 1.030   pH, UA 5.0 5.0 - 7.5   Color, UA Yellow Yellow   Appearance Ur Clear Clear   Leukocytes, UA Negative Negative   Protein, UA Negative Negative/Trace   Glucose, UA Negative Negative   Ketones, UA Negative Negative   RBC, UA Negative Negative   Bilirubin, UA Negative Negative    Urobilinogen, Ur 0.2 0.2 - 1.0 mg/dL   Nitrite, UA Negative Negative   Microscopic Examination See below:   BLADDER SCAN AMB NON-IMAGING  Result Value Ref Range   Scan Result 0ml     Pertinent Imaging: Study Result     CLINICAL DATA: Left testicular pain and swelling. Pain with urination.  EXAM: SCROTAL ULTRASOUND  DOPPLER ULTRASOUND OF THE TESTICLES  TECHNIQUE: Complete ultrasound examination of the testicles, epididymis, and other scrotal structures was performed. Color and spectral Doppler ultrasound were also utilized to evaluate blood flow to the testicles.  COMPARISON: CT of the abdomen and pelvis 10/15/2014.  FINDINGS: Right testicle  Measurements: 4.5 x 2.5 x 3.0 cm, within normal limits. No mass or microlithiasis visualized.  Left testicle  Measurements: 4.1 x 2.2 x 2.8 cm, within normal limits. No  mass or microlithiasis visualized.  Right epididymis: A benign cyst is present within the head of the right epididymis. This measures 1.1 x 0.9 x 1.0 cm. No other focal lesions are present.  Left epididymis: Normal in size and appearance.  Hydrocele: A moderate size left hydrocele appears simple. A small right hydrocele is noted.  Pulsed Doppler interrogation of both testes demonstrates normal low resistance arterial and venous waveforms bilaterally.  IMPRESSION: 1. Mild bilateral hydrocele, left greater than right. 2. Normal sonographic appearance of both testicles. 3. Normal color Doppler and wave analysis of both testicles. 4. Benign 1.1 cm epididymal cyst or spermatocele on the right.   Electronically Signed  By: Marin Roberts M.D.  On: 12/01/2015 07:16   Scrotal ultrasound personally reviewed today.  Assessment & Plan:  35 year old male with remote history of unclear urethral surgery at age 71 who presents with dysuria, recurrent "non-gonococcal urethritis" and penile pain after voiding. Based on the symptoms and  history, I am suspicious for recurrence of urethral stricture or foreign body.    UA today negative for infection. We will repeat GC/chlamydia screening today.  PVR today minimal.     Recommend a further workup with a cystoscopy in the office. He is somewhat anxious about this and has agreed to have the procedure with preprocedural Valium 10 mg. He did go ahead and sign a consent form today for the cystoscopy. We will discuss further workup/intervention pending the aforementioned procedure findings. Next  He is agreeable to this plan.  1. Dysuria As above - Urinalysis, Complete - BLADDER SCAN AMB NON-IMAGING  2. History of urethral stricture As above - BLADDER SCAN AMB NON-IMAGING   Return in about 4 weeks (around 12/31/2015) for cystoscopy.  Vanna Scotland, MD  [facility] [address], [address] [place], [place] [address] [phone]

## 2015-12-04 ENCOUNTER — Ambulatory Visit: Payer: Self-pay

## 2015-12-04 LAB — GC/CHLAMYDIA PROBE AMP
CHLAMYDIA, DNA PROBE: NEGATIVE
Neisseria gonorrhoeae by PCR: NEGATIVE

## 2015-12-07 ENCOUNTER — Telehealth: Payer: Self-pay

## 2015-12-07 NOTE — Telephone Encounter (Signed)
LMOM-most recent labs with Dr. Apolinar JunesBrandon are negative.

## 2015-12-07 NOTE — Telephone Encounter (Signed)
-----   Message from Vanna ScotlandAshley Brandon, MD sent at 12/04/2015  4:51 PM EDT ----- Negative for STI.  Please make patient aware.    Vanna ScotlandAshley Brandon, MD

## 2015-12-08 ENCOUNTER — Ambulatory Visit: Payer: Self-pay

## 2015-12-14 ENCOUNTER — Encounter: Payer: Self-pay | Admitting: Urology

## 2015-12-16 ENCOUNTER — Encounter: Payer: Self-pay | Admitting: Emergency Medicine

## 2015-12-16 ENCOUNTER — Emergency Department
Admission: EM | Admit: 2015-12-16 | Discharge: 2015-12-16 | Disposition: A | Discharge status: Home / Self Care | Payer: Self-pay | Attending: Emergency Medicine | Admitting: Emergency Medicine

## 2015-12-16 DIAGNOSIS — F17210 Nicotine dependence, cigarettes, uncomplicated: Secondary | ICD-10-CM | POA: Insufficient documentation

## 2015-12-16 DIAGNOSIS — A084 Viral intestinal infection, unspecified: Secondary | ICD-10-CM | POA: Insufficient documentation

## 2015-12-16 DIAGNOSIS — J45909 Unspecified asthma, uncomplicated: Secondary | ICD-10-CM | POA: Insufficient documentation

## 2015-12-16 DIAGNOSIS — Z7951 Long term (current) use of inhaled steroids: Secondary | ICD-10-CM | POA: Insufficient documentation

## 2015-12-16 MED ORDER — ONDANSETRON HCL 4 MG PO TABS: 4.0000 mg | ORAL_TABLET | Freq: Three times a day (TID) | ORAL | Status: DC | PRN

## 2015-12-16 NOTE — Discharge Instructions (Signed)

## 2015-12-16 NOTE — ED Notes (Signed)
Patient presents to the ED with nausea, vomiting, and sore throat.  Patient states, "I think I have a bug."  Patient is in no obvious distress at this time.  Patient reports vomiting x 2 in the past 24 hours.  2 episodes of diarrhea.  Patient denies abdominal pain.  Patient is in no obvious distress at this time.

## 2015-12-16 NOTE — ED Provider Notes (Signed)
[facility] Emergency Department Provider Note  ____________________________________________  Time seen: Approximately 5:18 PM  I have reviewed the triage vital signs and the nursing notes.   HISTORY  Chief Complaint Emesis and Sore Throat    HPI Douglas Singh is a 35 y.o. male patient complaining of nausea, vomiting, sore throat. Patient states also had intermittent fever and chills. Patient denies any blood in his stool. Patient denies any abdominal pain. No palliative measures taken for this complaint. Patient rates his pain discomfort as 8/10. Patient describes his pain as "achy".   Past Medical History  Diagnosis Date  . Asthma   . Anxiety   . Reflux   . Panic attacks     Patient Active Problem List   Diagnosis Date Noted  . Panic attack 12/03/2015  . Anxiety 06/16/2014  . Acid reflux 06/16/2014    Past Surgical History  Procedure Laterality Date  . Urethra surgery  2000?    stricture?    Current Outpatient Rx  Name  Route  Sig  Dispense  Refill  . albuterol (PROVENTIL HFA;VENTOLIN HFA) 108 (90 Base) MCG/ACT inhaler   Inhalation   Inhale into the lungs every 6 (six) hours as needed for wheezing or shortness of breath.         . clonazePAM (KLONOPIN) 0.5 MG tablet   Oral   Take 0.5 mg by mouth 3 (three) times daily as needed for anxiety.       0   . diazepam (VALIUM) 10 MG tablet   Oral   Take 1 tablet (10 mg total) by mouth once. Take 1 hour prior to procedure   1 tablet   0   . ondansetron (ZOFRAN) 4 MG tablet   Oral   Take 1 tablet (4 mg total) by mouth every 8 (eight) hours as needed for nausea or vomiting.   10 tablet   1   . tamsulosin (FLOMAX) 0.4 MG CAPS capsule   Oral   Take 1 capsule (0.4 mg total) by mouth daily after breakfast.   20 capsule   0     Allergies Codeine  Family History  Problem Relation Age of Onset  . Bladder Cancer Neg Hx   . Kidney cancer Neg Hx   . Prostate cancer Neg Hx      Social History Social History  Substance Use Topics  . Smoking status: Current Some Day Smoker -- 0.00 packs/day    Types: Cigarettes  . Smokeless tobacco: Never Used  . Alcohol Use: Yes     Comment: socially    Review of Systems Constitutional:Fever and chills Eyes: No visual changes. ENT: Sore throat Cardiovascular: Denies chest pain. Respiratory: Denies shortness of breath. Gastrointestinal: No abdominal pain. Nausea, vomiting, and diarrhea.  No constipation. Genitourinary: Negative for dysuria. Musculoskeletal: Negative for back pain. Skin: Negative for rash. Neurological: Negative for headaches, focal weakness or numbness. Psychiatric:Anxiety Hematological/Lymphatic: Allergic/Immunilogical: Codeine    ____________________________________________   PHYSICAL EXAM:  VITAL SIGNS: ED Triage Vitals  Enc Vitals Group     BP 12/16/15 1644 143/91 mmHg     Pulse Rate 12/16/15 1644 75     Resp 12/16/15 1644 18     Temp 12/16/15 1644 98.8 F (37.1 C)     Temp Source 12/16/15 1644 Oral     SpO2 12/16/15 1644 99 %     Weight 12/16/15 1644 210 lb (95.255 kg)     Height 12/16/15 1644  (1.727 m)  Head Cir --      Peak Flow --      Pain Score 12/16/15 1651 8     Pain Loc --      Pain Edu? --      Excl. in GC? --     Constitutional: Alert and oriented. Well appearing and in no acute distress. Eyes: Conjunctivae are normal. PERRL. EOMI. Head: Atraumatic. Nose: No congestion/rhinnorhea. Mouth/Throat: Mucous membranes are moist.  Oropharynx non-erythematous. Neck: No stridor.  No cervical spine tenderness to palpation. Hematological/Lymphatic/Immunilogical: No cervical lymphadenopathy. Cardiovascular: Normal rate, regular rhythm. Grossly normal heart sounds.  Good peripheral circulation. Respiratory: Normal respiratory effort.  No retractions. Lungs CTAB. Gastrointestinal: Soft and nontender. No distention. No abdominal bruits. Hyperactive bowel sounds No  CVA tenderness. Musculoskeletal: No lower extremity tenderness nor edema.  No joint effusions. Neurologic:  Normal speech and language. No gross focal neurologic deficits are appreciated. No gait instability. Skin:  Skin is warm, dry and intact. No rash noted. Psychiatric: Mood and affect are normal. Speech and behavior are normal.  ____________________________________________   LABS (all labs ordered are listed, but only abnormal results are displayed)  Labs Reviewed - No data to display ____________________________________________  EKG   ____________________________________________  RADIOLOGY   ____________________________________________   PROCEDURES  Procedure(s) performed: None  Critical Care performed: No  ____________________________________________   INITIAL IMPRESSION / ASSESSMENT AND PLAN / ED COURSE  Pertinent labs & imaging results that were available during my care of the patient were reviewed by me and considered in my medical decision making (see chart for details).  Viral gastroenteritis. Patient given discharge Instructions. Patient given a prescription for Phenergan. Patient advised [facility]. ____________________________________________   FINAL CLINICAL IMPRESSION(S) / ED DIAGNOSES  Final diagnoses:  Viral gastroenteritis      Joni ReiningRonald K Iliya Spivack, PA-C 12/16/15 1727  Maurilio LovelyNoelle McLaurin, MD 12/16/15 2326

## 2015-12-29 ENCOUNTER — Other Ambulatory Visit: Payer: Self-pay | Admitting: Urology

## 2016-01-07 ENCOUNTER — Other Ambulatory Visit: Payer: Self-pay | Admitting: Urology

## 2016-01-11 ENCOUNTER — Emergency Department
Admission: EM | Admit: 2016-01-11 | Discharge: 2016-01-11 | Disposition: A | Discharge status: Home / Self Care | Payer: Self-pay | Attending: Emergency Medicine | Admitting: Emergency Medicine

## 2016-01-11 ENCOUNTER — Emergency Department: Payer: Self-pay

## 2016-01-11 ENCOUNTER — Encounter: Payer: Self-pay | Admitting: *Deleted

## 2016-01-11 DIAGNOSIS — J45901 Unspecified asthma with (acute) exacerbation: Secondary | ICD-10-CM

## 2016-01-11 DIAGNOSIS — J45909 Unspecified asthma, uncomplicated: Secondary | ICD-10-CM | POA: Insufficient documentation

## 2016-01-11 DIAGNOSIS — F17210 Nicotine dependence, cigarettes, uncomplicated: Secondary | ICD-10-CM | POA: Insufficient documentation

## 2016-01-11 MED ORDER — ALBUTEROL SULFATE (2.5 MG/3ML) 0.083% IN NEBU
5.0000 mg | INHALATION_SOLUTION | Freq: Once | RESPIRATORY_TRACT | Status: AC
  Administered 2016-01-11: 5 mg via RESPIRATORY_TRACT
  Filled 2016-01-11: qty 6

## 2016-01-11 MED ORDER — IPRATROPIUM-ALBUTEROL 0.5-2.5 (3) MG/3ML IN SOLN
3.0000 mL | Freq: Once | RESPIRATORY_TRACT | Status: AC
  Administered 2016-01-11: 3 mL via RESPIRATORY_TRACT
  Filled 2016-01-11: qty 3

## 2016-01-11 NOTE — ED Notes (Signed)
Pt states he woke and became increasingly short of breath. Pt has hx of asthma, took albuterol MDI last night and was able to sleep, used MDI again after waking w/ some resolution. Pt c/o cough and continued shortness of breath.

## 2016-01-11 NOTE — ED Provider Notes (Signed)
[facility] Emergency Department Provider Note  ____________________________________________    I have reviewed the triage vital signs and the nursing notes.   HISTORY  Chief Complaint Cough and Shortness of Breath    HPI Douglas Singh is a 35 y.o. male who presents with complaints of shortness of breath. Patient with history of asthma reports he took his albuterol inhaler last night which did seem to help. This morning felt mildly short of breath with some mild chest tightness. Does report nasal congestion and a mild cough as well. Afebrile. No recent travel. No calf pain or swelling.     Past Medical History  Diagnosis Date  . Asthma   . Anxiety   . Reflux   . Panic attacks     Patient Active Problem List   Diagnosis Date Noted  . Panic attack 12/03/2015  . Anxiety 06/16/2014  . Acid reflux 06/16/2014    Past Surgical History  Procedure Laterality Date  . Urethra surgery  2000?    stricture?    Current Outpatient Rx  Name  Route  Sig  Dispense  Refill  . albuterol (PROVENTIL HFA;VENTOLIN HFA) 108 (90 Base) MCG/ACT inhaler   Inhalation   Inhale into the lungs every 6 (six) hours as needed for wheezing or shortness of breath.         . clonazePAM (KLONOPIN) 0.5 MG tablet   Oral   Take 0.5 mg by mouth 3 (three) times daily as needed for anxiety.       0   . diazepam (VALIUM) 10 MG tablet   Oral   Take 1 tablet (10 mg total) by mouth once. Take 1 hour prior to procedure   1 tablet   0   . ondansetron (ZOFRAN) 4 MG tablet   Oral   Take 1 tablet (4 mg total) by mouth every 8 (eight) hours as needed for nausea or vomiting.   10 tablet   1   . tamsulosin (FLOMAX) 0.4 MG CAPS capsule   Oral   Take 1 capsule (0.4 mg total) by mouth daily after breakfast.   20 capsule   0     Allergies Codeine  Family History  Problem Relation Age of Onset  . Bladder Cancer Neg Hx   . Kidney cancer Neg Hx   . Prostate cancer Neg Hx      Social History Social History  Substance Use Topics  . Smoking status: Current Some Day Smoker -- 0.00 packs/day    Types: Cigarettes  . Smokeless tobacco: Never Used  . Alcohol Use: Yes     Comment: socially    Review of Systems  Constitutional: Negative for fever. Eyes: Negative for redness ENT: Negative for sore throat Cardiovascular: Negative for chest pain Respiratory: As above Gastrointestinal: Negative for abdominal pain  Musculoskeletal: Negative for calf pain Skin: Negative for rash. Neurological: Negative for headache Psychiatric: no anxiety    ____________________________________________   PHYSICAL EXAM:  VITAL SIGNS: ED Triage Vitals  Enc Vitals Group     BP 01/11/16 0636 137/86 mmHg     Pulse Rate 01/11/16 0636 78     Resp 01/11/16 0636 20     Temp 01/11/16 0636 98.4 F (36.9 C)     Temp Source 01/11/16 0636 Oral     SpO2 01/11/16 0636 95 %     Weight 01/11/16 0636 210 lb (95.255 kg)     Height 01/11/16 0636  (1.727 m)     Head Cir --  Peak Flow --      Pain Score --      Pain Loc --      Pain Edu? --      Excl. in GC? --      Constitutional: Alert and oriented. Well appearing and in no distress.  Eyes: Conjunctivae are normal. No erythema or injection ENT   Head: Normocephalic and atraumatic.   Mouth/Throat: Mucous membranes are moist. Cardiovascular: Normal rate, regular rhythm. Normal and symmetric distal pulses are present in the upper extremities.   Respiratory: Normal respiratory effort without tachypnea nor retractions. Breath sounds are clear and equal bilaterally.  Gastrointestinal: Soft and non-tender in all quadrants. No distention. There is no CVA tenderness. Genitourinary: deferred Musculoskeletal: Nontender with normal range of motion in all extremities. No lower extremity tenderness nor edema. Neurologic:  Normal speech and language. No gross focal neurologic deficits are appreciated. Skin:  Skin is warm,  dry and intact.  Psychiatric: Mood and affect are normal. Patient exhibits appropriate insight and judgment.  ____________________________________________    LABS (pertinent positives/negatives)  Labs Reviewed - No data to display  ____________________________________________   EKG  None  ____________________________________________    RADIOLOGY  Chest x-ray normal  ____________________________________________   PROCEDURES  Procedure(s) performed: none  Critical Care performed: none  ____________________________________________   INITIAL IMPRESSION / ASSESSMENT AND PLAN / ED COURSE  Pertinent labs & imaging results that were available during my care of the patient were reviewed by me and considered in my medical decision making (see chart for details).  Patient well-appearing and in no distress. X-ray ordered prior to my arrival is unremarkable. Given DuoNeb, no wheezing now. He feels well, all vitals are normal. Recommend supportive care  ____________________________________________   FINAL CLINICAL IMPRESSION(S) / ED DIAGNOSES  Final diagnoses:  Asthma attack          Douglas Everyobert Janus Vlcek, MD 01/11/16 1459

## 2016-01-11 NOTE — Discharge Instructions (Signed)

## 2016-01-26 ENCOUNTER — Emergency Department
Admission: EM | Admit: 2016-01-26 | Discharge: 2016-01-26 | Disposition: A | Discharge status: Home / Self Care | Payer: Self-pay | Attending: Emergency Medicine | Admitting: Emergency Medicine

## 2016-01-26 ENCOUNTER — Encounter: Payer: Self-pay | Admitting: Urology

## 2016-01-26 DIAGNOSIS — K2100 Gastro-esophageal reflux disease with esophagitis, without bleeding: Secondary | ICD-10-CM

## 2016-01-26 DIAGNOSIS — J45909 Unspecified asthma, uncomplicated: Secondary | ICD-10-CM | POA: Insufficient documentation

## 2016-01-26 DIAGNOSIS — R112 Nausea with vomiting, unspecified: Secondary | ICD-10-CM

## 2016-01-26 DIAGNOSIS — F419 Anxiety disorder, unspecified: Secondary | ICD-10-CM | POA: Insufficient documentation

## 2016-01-26 DIAGNOSIS — F17210 Nicotine dependence, cigarettes, uncomplicated: Secondary | ICD-10-CM | POA: Insufficient documentation

## 2016-01-26 DIAGNOSIS — Z79899 Other long term (current) drug therapy: Secondary | ICD-10-CM | POA: Insufficient documentation

## 2016-01-26 DIAGNOSIS — R197 Diarrhea, unspecified: Secondary | ICD-10-CM | POA: Insufficient documentation

## 2016-01-26 DIAGNOSIS — R0602 Shortness of breath: Secondary | ICD-10-CM | POA: Insufficient documentation

## 2016-01-26 MED ORDER — CLONAZEPAM 0.5 MG PO TABS
0.5000 mg | ORAL_TABLET | Freq: Once | ORAL | Status: AC
  Administered 2016-01-26: 0.5 mg via ORAL
  Filled 2016-01-26: qty 1

## 2016-01-26 MED ORDER — ONDANSETRON 4 MG PO TBDP
4.0000 mg | ORAL_TABLET | Freq: Once | ORAL | Status: AC
  Administered 2016-01-26: 4 mg via ORAL
  Filled 2016-01-26: qty 1

## 2016-01-26 MED ORDER — ONDANSETRON 4 MG PO TBDP: 4.0000 mg | ORAL_TABLET | Freq: Three times a day (TID) | ORAL | Status: DC | PRN

## 2016-01-26 MED ORDER — GI COCKTAIL ~~LOC~~
30.0000 mL | Freq: Once | ORAL | Status: AC
  Administered 2016-01-26: 30 mL via ORAL
  Filled 2016-01-26: qty 30

## 2016-01-26 NOTE — Discharge Instructions (Signed)
1. You may take nausea medicine as needed (Zofran #20). 2. Clear liquids 12 hours, then bland foods 5 days, then slowly advance diet as tolerated. 3. Return to the ER for worsening symptoms, persistent vomiting, difficulty breathing or other concerns.  Esophagitis Esophagitis is inflammation of the esophagus. The esophagus is the tube that carries food and liquids from your mouth to your stomach. Esophagitis can cause soreness or pain in the esophagus. This condition can make it difficult and painful to swallow.  CAUSES Most causes of esophagitis are not serious. Common causes of this condition include:  Gastroesophageal reflux disease (GERD). This is when stomach contents move back up into the esophagus (reflux).  Repeated vomiting.  An allergic-type reaction, especially caused by food allergies (eosinophilic esophagitis).  Injury to the esophagus by swallowing large pills with or without water, or swallowing certain types of medicines.  Swallowing (ingesting) harmful chemicals, such as household cleaning products.  Heavy alcohol use.  An infection of the esophagus.This most often occurs in people who have a weakened immune system.  Radiation or chemotherapy treatment for cancer.  Certain diseases such as sarcoidosis, Crohn disease, and scleroderma. SYMPTOMS Symptoms of this condition include:  Difficult or painful swallowing.  Pain with swallowing acidic liquids, such as citrus juices.  Pain with burping.  Chest pain.  Difficulty breathing.  Nausea.  Vomiting.  Pain in the abdomen.  Weight loss.  Ulcers in the mouth.  Patches of white material in the mouth (candidiasis).  Fever.  Coughing up blood or vomiting blood.  Stool that is black, tarry, or bright red. DIAGNOSIS Your health care provider will take a medical history and perform a physical exam. You may also have other tests, including:  An endoscopy to examine your stomach and esophagus with a  small camera.  A test that measures the acidity level in your esophagus.  A test that measures how much pressure is on your esophagus.  A barium swallow or modified barium swallow to show the shape, size, and functioning of your esophagus.  Allergy tests. TREATMENT Treatment for this condition depends on the cause of your esophagitis. In some cases, steroids or other medicines may be given to help relieve your symptoms or to treat the underlying cause of your condition. You may have to make some lifestyle changes, such as:  Avoiding alcohol.  Quitting smoking.  Changing your diet.  Exercising.  Changing your sleep habits and your sleep environment. HOME CARE INSTRUCTIONS Take these actions to decrease your discomfort and to help avoid complications. Diet  Follow a diet as recommended by your health care provider. This may involve avoiding foods and drinks such as:  Coffee and tea (with or without caffeine).  Drinks that contain alcohol.  Energy drinks and sports drinks.  Carbonated drinks or sodas.  Chocolate and cocoa.  Peppermint and mint flavorings.  Garlic and onions.  Horseradish.  Spicy and acidic foods, including peppers, chili powder, curry powder, vinegar, hot sauces, and barbecue sauce.  Citrus fruit juices and citrus fruits, such as oranges, lemons, and limes.  Tomato-based foods, such as red sauce, chili, salsa, and pizza with red sauce.  Fried and fatty foods, such as donuts, french fries, potato chips, and high-fat dressings.  High-fat meats, such as hot dogs and fatty cuts of red and white meats, such as rib eye steak, sausage, ham, and bacon.  High-fat dairy items, such as whole milk, butter, and cream cheese.  Eat small, frequent meals instead of large meals.  Avoid  drinking large amounts of liquid with your meals.  Avoid eating meals during the 2-3 hours before bedtime.  Avoid lying down right after you eat.  Do not exercise right after  you eat.  Avoid foods and drinks that seem to make your symptoms worse. General Instructions  Pay attention to any changes in your symptoms.  Take over-the-counter and prescription medicines only as told by your health care provider. Do not take aspirin, ibuprofen, or other NSAIDs unless your health care provider told you to do so.  If you have trouble taking pills, use a pill splitter to decrease the size of the pill. This will decrease the chance of the pill getting stuck or injuring your esophagus on the way down. Also, drink water after you take a pill.  Do not use any tobacco products, including cigarettes, chewing tobacco, and e-cigarettes. If you need help quitting, ask your health care provider.  Wear loose-fitting clothing. Do not wear anything tight around your waist that causes pressure on your abdomen.  Raise (elevate) the head of your bed about 6 inches (15 cm).  Try to reduce your stress, such as with yoga or meditation. If you need help reducing stress, ask your health care provider.  If you are overweight, reduce your weight to an amount that is healthy for you. Ask your health care provider for guidance about a safe weight loss goal.  Keep all follow-up visits as told by your health care provider. This is important. SEEK MEDICAL CARE IF:  You have new symptoms.  You have unexplained weight loss.  You have difficulty swallowing, or it hurts to swallow.  You have wheezing or a persistent cough.  Your symptoms do not improve with treatment.  You have frequent heartburn for more than two weeks. SEEK IMMEDIATE MEDICAL CARE IF:  You have severe pain in your arms, neck, jaw, teeth, or back.  You feel sweaty, dizzy, or light-headed.  You have chest pain or shortness of breath.  You vomit and your vomit looks like blood or coffee grounds.  Your stool is bloody or black.  You have a fever.  You cannot swallow, drink, or eat.   This information is not intended  to replace advice given to you by your health care provider. Make sure you discuss any questions you have with your health care provider.   Document Released: 09/22/2004 Document Revised: 05/06/2015 Document Reviewed: 12/10/2014 Elsevier Interactive Patient Education 2016 [employer].  Food Choices for Gastroesophageal Reflux Disease, Adult When you have gastroesophageal reflux disease (GERD), the foods you eat and your eating habits are very important. Choosing the right foods can help ease the discomfort of GERD. WHAT GENERAL GUIDELINES DO I NEED TO FOLLOW?  Choose fruits, vegetables, whole grains, low-fat dairy products, and low-fat meat, fish, and poultry.  Limit fats such as oils, salad dressings, butter, nuts, and avocado.  Keep a food diary to identify foods that cause symptoms.  Avoid foods that cause reflux. These may be different for different people.  Eat frequent small meals instead of three large meals each day.  Eat your meals slowly, in a relaxed setting.  Limit fried foods.  Cook foods using methods other than frying.  Avoid drinking alcohol.  Avoid drinking large amounts of liquids with your meals.  Avoid bending over or lying down until 2-3 hours after eating. WHAT FOODS ARE NOT RECOMMENDED? The following are some foods and drinks that may worsen your symptoms: Vegetables Tomatoes. Tomato juice. Tomato and spaghetti  sauce. Chili peppers. Onion and garlic. Horseradish. Fruits Oranges, grapefruit, and lemon (fruit and juice). Meats High-fat meats, fish, and poultry. This includes hot dogs, ribs, ham, sausage, salami, and bacon. Dairy Whole milk and chocolate milk. Sour cream. Cream. Butter. Ice cream. Cream cheese.  Beverages Coffee and tea, with or without caffeine. Carbonated beverages or energy drinks. Condiments Hot sauce. Barbecue sauce.  Sweets/Desserts Chocolate and cocoa. Donuts. Peppermint and spearmint. Fats and Oils High-fat foods, including  [place] fries and potato chips. Other Vinegar. Strong spices, such as black pepper, white pepper, red pepper, cayenne, curry powder, cloves, ginger, and chili powder. The items listed above may not be a complete list of foods and beverages to avoid. Contact your dietitian for more information.   This information is not intended to replace advice given to you by your health care provider. Make sure you discuss any questions you have with your health care provider.   Document Released: 08/15/2005 Document Revised: 09/05/2014 Document Reviewed: 06/19/2013 Elsevier Interactive Patient Education 2016 [employer] Inc.  Nausea and Vomiting Nausea means you feel sick to your stomach. Throwing up (vomiting) is a reflex where stomach contents come out of your mouth. HOME CARE   Take medicine as told by your doctor.  Do not force yourself to eat. However, you do need to drink fluids.  If you feel like eating, eat a normal diet as told by your doctor.  Eat rice, wheat, potatoes, bread, lean meats, yogurt, fruits, and vegetables.  Avoid high-fat foods.  Drink enough fluids to keep your pee (urine) clear or pale yellow.  Ask your doctor how to replace body fluid losses (rehydrate). Signs of body fluid loss (dehydration) include:  Feeling very thirsty.  Dry lips and mouth.  Feeling dizzy.  Dark pee.  Peeing less than normal.  Feeling confused.  Fast breathing or heart rate. GET HELP RIGHT AWAY IF:   You have blood in your throw up.  You have black or bloody poop (stool).  You have a bad headache or stiff neck.  You feel confused.  You have bad belly (abdominal) pain.  You have chest pain or trouble breathing.  You do not pee at least once every 8 hours.  You have cold, clammy skin.  You keep throwing up after 24 to 48 hours.  You have a fever. MAKE SURE YOU:   Understand these instructions.  Will watch your condition.  Will get help right away if you are not doing  well or get worse.   This information is not intended to replace advice given to you by your health care provider. Make sure you discuss any questions you have with your health care provider.   Document Released: 02/01/2008 Document Revised: 11/07/2011 Document Reviewed: 01/14/2011 Elsevier Interactive Patient Education 2016 Elsevier Inc.  Panic Attacks Panic attacks are sudden, short feelings of great fear or discomfort. You may have them for no reason when you are relaxed, when you are uneasy (anxious), or when you are sleeping.  HOME CARE  Take all your medicines as told.  Check with your doctor before starting new medicines.  Keep all doctor visits. GET HELP IF:  You are not able to take your medicines as told.  Your symptoms do not get better.  Your symptoms get worse. GET HELP RIGHT AWAY IF:  Your attacks seem different than your normal attacks.  You have thoughts about hurting yourself or others.  You take panic attack medicine and you have a side effect. MAKE SURE  YOU:  Understand these instructions.  Will watch your condition.  Will get help right away if you are not doing well or get worse.   This information is not intended to replace advice given to you by your health care provider. Make sure you discuss any questions you have with your health care provider.   Document Released: 09/17/2010 Document Revised: 06/05/2013 Document Reviewed: 03/29/2013 Elsevier Interactive Patient Education [employer]

## 2016-01-26 NOTE — ED Provider Notes (Signed)
[facility] Emergency Department Provider Note   ____________________________________________  Time seen: Approximately 5:58 AM  I have reviewed the triage vital signs and the nursing notes.   HISTORY  Chief Complaint Shortness of Breath and Emesis    HPI Unk PintoKeith Odom is a 35 y.o. male who presents to the ED from home with a chief complaint of anxiety, shortness of breath, nausea, vomiting, diarrhea. Patient is well-known to the emergency department for anxiety with panic attacks, as well as alcohol-induced esophagitis. Patient states his daughter was sick with the GI bug over the weekend. Since yesterday, patient has been experiencing occasional nausea, vomiting and diarrhea. He reports also that he ran out of his clonazepam 3 days ago and his prescription will not be refilled for another 4 days. Over the weekend he treated his anxiety with alcohol. Denies fever, chills, chest pain, dysuria. Denies recent travel or trauma. Nothing makes his symptoms better or worse.   Past Medical History  Diagnosis Date  . Asthma   . Anxiety   . Reflux   . Panic attacks     Patient Active Problem List   Diagnosis Date Noted  . Panic attack 12/03/2015  . Anxiety 06/16/2014  . Acid reflux 06/16/2014    Past Surgical History  Procedure Laterality Date  . Urethra surgery  2000?    stricture?    Current Outpatient Rx  Name  Route  Sig  Dispense  Refill  . albuterol (PROVENTIL HFA;VENTOLIN HFA) 108 (90 Base) MCG/ACT inhaler   Inhalation   Inhale into the lungs every 6 (six) hours as needed for wheezing or shortness of breath.         . clonazePAM (KLONOPIN) 0.5 MG tablet   Oral   Take 0.5 mg by mouth 3 (three) times daily as needed for anxiety.       0   . diazepam (VALIUM) 10 MG tablet   Oral   Take 1 tablet (10 mg total) by mouth once. Take 1 hour prior to procedure   1 tablet   0   . ondansetron (ZOFRAN ODT) 4 MG disintegrating tablet   Oral  Take 1 tablet (4 mg total) by mouth every 8 (eight) hours as needed for nausea or vomiting.   20 tablet   0   . ondansetron (ZOFRAN) 4 MG tablet   Oral   Take 1 tablet (4 mg total) by mouth every 8 (eight) hours as needed for nausea or vomiting.   10 tablet   1   . tamsulosin (FLOMAX) 0.4 MG CAPS capsule   Oral   Take 1 capsule (0.4 mg total) by mouth daily after breakfast.   20 capsule   0     Allergies Codeine  Family History  Problem Relation Age of Onset  . Bladder Cancer Neg Hx   . Kidney cancer Neg Hx   . Prostate cancer Neg Hx     Social History Social History  Substance Use Topics  . Smoking status: Current Some Day Smoker -- 0.00 packs/day    Types: Cigarettes  . Smokeless tobacco: Never Used  . Alcohol Use: Yes     Comment: socially    Review of Systems  Constitutional: No fever/chills. Eyes: No visual changes. ENT: No sore throat. Cardiovascular: Denies chest pain. Respiratory: Positive for shortness of breath. Gastrointestinal: No abdominal pain.  Positive for nausea, vomiting and diarrhea.  No constipation. Genitourinary: Negative for dysuria. Musculoskeletal: Negative for back pain. Skin: Negative for rash.  Neurological: Negative for headaches, focal weakness or numbness.  10-point ROS otherwise negative.  ____________________________________________   PHYSICAL EXAM:  VITAL SIGNS: ED Triage Vitals  Enc Vitals Group     BP 01/26/16 0508 136/75 mmHg     Pulse Rate 01/26/16 0508 65     Resp 01/26/16 0508 18     Temp 01/26/16 0508 98.1 F (36.7 C)     Temp Source 01/26/16 0508 Oral     SpO2 01/26/16 0508 98 %     Weight 01/26/16 0508 210 lb (95.255 kg)     Height 01/26/16 0508  (1.727 m)     Head Cir --      Peak Flow --      Pain Score 01/26/16 0510 5     Pain Loc --      Pain Edu? --      Excl. in GC? --     Constitutional: Alert and oriented. Well appearing and in no acute distress. Eyes: Conjunctivae are normal. PERRL.  EOMI. Head: Atraumatic. Nose: No congestion/rhinnorhea. Mouth/Throat: Mucous membranes are moist.  Oropharynx non-erythematous. Neck: No stridor.   Cardiovascular: Normal rate, regular rhythm. Grossly normal heart sounds.  Good peripheral circulation. Respiratory: Normal respiratory effort.  No retractions. Lungs CTAB. Gastrointestinal: Soft and nontender. No distention. No abdominal bruits. No CVA tenderness. Musculoskeletal: No lower extremity tenderness nor edema.  No joint effusions. Neurologic:  Normal speech and language. No gross focal neurologic deficits are appreciated. No gait instability. Skin:  Skin is warm, dry and intact. No rash noted. Psychiatric: Mood and affect are normal. Speech and behavior are normal.  ____________________________________________   LABS (all labs ordered are listed, but only abnormal results are displayed)  Labs Reviewed - No data to display ____________________________________________  EKG  None ____________________________________________  RADIOLOGY  None ____________________________________________   PROCEDURES  Procedure(s) performed: None  Critical Care performed: No  ____________________________________________   INITIAL IMPRESSION / ASSESSMENT AND PLAN / ED COURSE  Pertinent labs & imaging results that were available during my care of the patient were reviewed by me and considered in my medical decision making (see chart for details).  34-year-old male who presents with anxiety, nausea, vomiting, diarrhea. Vomiting and diarrhea resolved last evening; patient has tolerated PO since. 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 Some of his symptoms may also be partially secondary to benzodiazepine withdrawal. We'll give one time dose of Klonopin now, along with ODT Zofran and GI cocktail. Strict return precautions given. Patient  verbalizes understanding and agrees with plan of care. ____________________________________________   FINAL CLINICAL IMPRESSION(S) / ED DIAGNOSES  Final diagnoses:  Anxiety  Gastroesophageal reflux disease with esophagitis  Nausea vomiting and diarrhea      NEW MEDICATIONS STARTED DURING THIS VISIT:  New Prescriptions   ONDANSETRON (ZOFRAN ODT) 4 MG DISINTEGRATING TABLET    Take 1 tablet (4 mg total) by mouth every 8 (eight) hours as needed for nausea or vomiting.     Note:  This document was prepared using Dragon voice recognition software and may include unintentional dictation errors.    Irean Hong, MD 01/26/16 [phone]

## 2016-01-26 NOTE — ED Notes (Signed)
Pt states he has been SOB since Sunday accompanied with N/V/D. Pt reports his daughter has been sick recently with the same symptoms, dx with upper respiratory infection and a stomach virus. Pt has hx of asthma, using an albuterol inhaler. Pt reports he ran out of his clonazepam 0.5 since Friday.

## 2016-01-26 NOTE — ED Notes (Signed)
Patient states he had onset Sunday of shortness of breath and vomiting. States he has been using his inhaler and pepto bismal but it causes a burning sensation in his chest.

## 2016-02-10 ENCOUNTER — Other Ambulatory Visit: Payer: Self-pay | Admitting: Urology

## 2016-02-19 ENCOUNTER — Other Ambulatory Visit: Payer: Self-pay | Admitting: Urology

## 2016-02-19 ENCOUNTER — Encounter: Payer: Self-pay | Admitting: Urology

## 2016-03-02 ENCOUNTER — Emergency Department
Admission: EM | Admit: 2016-03-02 | Discharge: 2016-03-02 | Disposition: A | Discharge status: Home / Self Care | Payer: Self-pay | Attending: Emergency Medicine | Admitting: Emergency Medicine

## 2016-03-02 ENCOUNTER — Emergency Department: Payer: Self-pay

## 2016-03-02 ENCOUNTER — Encounter: Payer: Self-pay | Admitting: *Deleted

## 2016-03-02 DIAGNOSIS — J45909 Unspecified asthma, uncomplicated: Secondary | ICD-10-CM | POA: Insufficient documentation

## 2016-03-02 DIAGNOSIS — H1011 Acute atopic conjunctivitis, right eye: Secondary | ICD-10-CM

## 2016-03-02 DIAGNOSIS — M791 Myalgia: Secondary | ICD-10-CM | POA: Insufficient documentation

## 2016-03-02 DIAGNOSIS — M7918 Myalgia, other site: Secondary | ICD-10-CM

## 2016-03-02 DIAGNOSIS — Z7951 Long term (current) use of inhaled steroids: Secondary | ICD-10-CM | POA: Insufficient documentation

## 2016-03-02 DIAGNOSIS — F17210 Nicotine dependence, cigarettes, uncomplicated: Secondary | ICD-10-CM | POA: Insufficient documentation

## 2016-03-02 DIAGNOSIS — J309 Allergic rhinitis, unspecified: Secondary | ICD-10-CM

## 2016-03-02 DIAGNOSIS — H109 Unspecified conjunctivitis: Secondary | ICD-10-CM | POA: Insufficient documentation

## 2016-03-02 MED ORDER — NAPROXEN 500 MG PO TABS: 500.0000 mg | ORAL_TABLET | Freq: Two times a day (BID) | ORAL | Status: DC

## 2016-03-02 MED ORDER — LORATADINE 10 MG PO TABS: 10.0000 mg | ORAL_TABLET | Freq: Every day | ORAL | Status: DC

## 2016-03-02 NOTE — Discharge Instructions (Signed)
Allergic Conjunctivitis A thin, clear membrane (conjunctiva) covers the white part of your eye and the inner surface of your eyelid. Allergic conjunctivitis happens when this membrane gets irritated. This is caused by allergies. Common things (allergens) that can cause an allergic reaction include:  Dust.  Pollen.  Mold.  Animal:  Hair.  Fur.  Skin.  Saliva or other animal fluids. This condition can make your eye red or pink. It can also make your eye feel itchy. This condition cannot be spread by one person to another person (noncontagious).  HOME CARE  Take or apply medicines only as told by your doctor.  Avoid touching or rubbing your eyes.  Apply a cool, clean washcloth to your eye for 10-20 minutes. Do this 3-4 times a day.  If you wear contact lenses, do not wear them until the irritation is gone. Wear glasses in the meantime.  Avoid wearing eye makeup until the irritation is gone.  Try to avoid whatever allergen is causing the allergic reaction. GET HELP IF:  Your symptoms get worse.  You have pus draining from your eyes.  You have new symptoms.  You have a fever.   This information is not intended to replace advice given to you by your health care provider. Make sure you discuss any questions you have with your health care provider.   Document Released: 02/02/2010 Document Revised: 09/05/2014 Document Reviewed: 05/27/2014 Elsevier Interactive Patient Education 2016 Elsevier Inc.  Asthma, Adult Asthma is a condition of the lungs in which the airways tighten and narrow. Asthma can make it hard to breathe. Asthma cannot be cured, but medicine and lifestyle changes can help control it. Asthma may be started (triggered) by:  Animal skin flakes (dander).  Dust.  Cockroaches.  Pollen.  Mold.  Smoke.  Cleaning products.  Hair sprays or aerosol sprays.  Paint fumes or strong smells.  Cold air, weather changes, and winds.  Crying or laughing  hard.  Stress.  Certain medicines or drugs.  Foods, such as dried fruit, potato chips, and sparkling grape juice.  Infections or conditions (colds, flu).  Exercise.  Certain medical conditions or diseases.  Exercise or tiring activities. HOME CARE   Take medicine as told by your doctor.  Use a peak flow meter as told by your doctor. A peak flow meter is a tool that measures how well the lungs are working.  Record and keep track of the peak flow meter's readings.  Understand and use the asthma action plan. An asthma action plan is a written plan for taking care of your asthma and treating your attacks.  To help prevent asthma attacks:  Do not smoke. Stay away from secondhand smoke.  Change your heating and air conditioning filter often.  Limit your use of fireplaces and wood stoves.  Get rid of pests (such as roaches and mice) and their droppings.  Throw away plants if you see mold on them.  Clean your floors. Dust regularly. Use cleaning products that do not smell.  Have someone vacuum when you are not home. Use a vacuum cleaner with a HEPA filter if possible.  Replace carpet with wood, tile, or vinyl flooring. Carpet can trap animal skin flakes and dust.  Use allergy-proof pillows, mattress covers, and box spring covers.  Wash bed sheets and blankets every week in hot water and dry them in a dryer.  Use blankets that are made of polyester or cotton.  Clean bathrooms and kitchens with bleach. If possible, have someone repaint  the walls in these rooms with mold-resistant paint. Keep out of the rooms that are being cleaned and painted.  Wash hands often. GET HELP IF:  You have make a whistling sound when breaking (wheeze), have shortness of breath, or have a cough even if taking medicine to prevent attacks.  The colored mucus you cough up (sputum) is thicker than usual.  The colored mucus you cough up changes from clear or white to yellow, green, gray, or  bloody.  You have problems from the medicine you are taking such as:  A rash.  Itching.  Swelling.  Trouble breathing.  You need reliever medicines more than 2-3 times a week.  Your peak flow measurement is still at 50-79% of your personal best after following the action plan for 1 hour.  You have a fever. GET HELP RIGHT AWAY IF:   You seem to be worse and are not responding to medicine during an asthma attack.  You are short of breath even at rest.  You get short of breath when doing very little activity.  You have trouble eating, drinking, or talking.  You have chest pain.  You have a fast heartbeat.  Your lips or fingernails start to turn blue.  You are light-headed, dizzy, or faint.  Your peak flow is less than 50% of your personal best.   This information is not intended to replace advice given to you by your health care provider. Make sure you discuss any questions you have with your health care provider.   Document Released: 02/01/2008 Document Revised: 05/06/2015 Document Reviewed: 03/14/2013 Elsevier Interactive Patient Education 2016 Elsevier Inc.  Musculoskeletal Pain Musculoskeletal pain is muscle and boney aches and pains. These pains can occur in any part of the body. Your caregiver may treat you without knowing the cause of the pain. They may treat you if blood or urine tests, X-rays, and other tests were normal.  CAUSES There is often not a definite cause or reason for these pains. These pains may be caused by a type of germ (virus). The discomfort may also come from overuse. Overuse includes working out too hard when your body is not fit. Boney aches also come from weather changes. Bone is sensitive to atmospheric pressure changes. HOME CARE INSTRUCTIONS   Ask when your test results will be ready. Make sure you get your test results.  Only take over-the-counter or prescription medicines for pain, discomfort, or fever as directed by your caregiver.  If you were given medications for your condition, do not drive, operate machinery or power tools, or sign legal documents for 24 hours. Do not drink alcohol. Do not take sleeping pills or other medications that may interfere with treatment.  Continue all activities unless the activities cause more pain. When the pain lessens, slowly resume normal activities. Gradually increase the intensity and duration of the activities or exercise.  During periods of severe pain, bed rest may be helpful. Lay or sit in any position that is comfortable.  Putting ice on the injured area.  Put ice in a bag.  Place a towel between your skin and the bag.  Leave the ice on for 15 to 20 minutes, 3 to 4 times a day.  Follow up with your caregiver for continued problems and no reason can be found for the pain. If the pain becomes worse or does not go away, it may be necessary to repeat tests or do additional testing. Your caregiver may need to look further for a  possible cause. SEEK IMMEDIATE MEDICAL CARE IF:  You have pain that is getting worse and is not relieved by medications.  You develop chest pain that is associated with shortness or breath, sweating, feeling sick to your stomach (nauseous), or throw up (vomit).  Your pain becomes localized to the abdomen.  You develop any new symptoms that seem different or that concern you. MAKE SURE YOU:   Understand these instructions.  Will watch your condition.  Will get help right away if you are not doing well or get worse.   This information is not intended to replace advice given to you by your health care provider. Make sure you discuss any questions you have with your health care provider.   Document Released: 08/15/2005 Document Revised: 11/07/2011 Document Reviewed: 04/19/2013 Elsevier Interactive Patient Education 2016 Elsevier Inc.   Hay Fever    Hay fever is a type of allergy that people have to things like grass, animals, or pollen from  plants and flowers. It cannot be passed from one person to another. You cannot cure hay fever, but there are things that may help relieve your problems (symptoms).  HOME CARE  Avoid the things that may be causing your problems.  Take all medicine as told by your doctor. GET HELP RIGHT AWAY IF:  You have asthma, a cough, and you start making whistling sounds when breathing (wheezing).  Your tongue or lips are puffy (swollen).  You have trouble breathing.  You feel lightheaded or like you will pass out (faint).  You have a fever.  Your problems are getting worse and your medicine is not helping.  Your treatment was working, but your problems have come back.  You are stuffed up (congested) and have pressure in your face.  You have a headache.  You have cold sweats. MAKE SURE YOU:  Understand these instructions.  Will watch your condition.  Will get help right away if you are not doing well or get worse. This information is not intended to replace advice given to you by your health care provider. Make sure you discuss any questions you have with your health care provider.  Document Released: 12/15/2010 Document Revised: 11/07/2011 Document Reviewed: 02/25/2015  Elsevier Interactive Patient Education [employer]2016 Elsevier Inc.

## 2016-03-02 NOTE — ED Notes (Signed)
States he noticed right eye redness this AM, pt also states right lower back pain and increased SOB over the past 2 days, also states neck pain and left shoulder pain, states hx of asthma

## 2016-03-02 NOTE — ED Provider Notes (Signed)
[facility] Emergency Department Provider Note  ____________________________________________  Time seen: Approximately 3:44 PM  I have reviewed the triage vital signs and the nursing notes.   HISTORY  Chief Complaint Conjunctivitis and Asthma    HPI Douglas Singh is a 35 y.o. male , NAD, well-known to the emergency department, presents today with multiple medical complaints. States he noted some redness to the anterior portion of his right eye this morning. Denies pain, visual changes, injury or trauma to the eye, discharge. Notes that the redness has significantly decreased since this morning. Notes that he has had some mild nasal congestion and runny nose but without sneezing, sinus sinus pressure, ear pain or sore throat.   Also notes that he has had shortness of breath, chest tightness and asthma symptoms off and on over the last 48 hours. Has been utilizing a pro-air rescue inhaler which seems to help. Denies any chest pain, abdominal pain, nausea, vomiting, diaphoresis. Has had no numbness, weakness, tingling. Denies any visual changes, changes in speech or gait.  Patient has also had 2-3 day history of neck pain and right lower back pain. States that the pain is more of an ache. Denies any falls, injuries or traumas to the neck or back. Has not had any head injuries, M.D. cc. Has not taken anything over-the-counter for his complaints. Denies any changes in urinary or bowel habits. Has not had any dysuria, hematuria, urethral discharge. Denies fevers, chills, body aches.   Past Medical History  Diagnosis Date  . Asthma   . Anxiety   . Reflux   . Panic attacks     Patient Active Problem List   Diagnosis Date Noted  . Panic attack 12/03/2015  . Anxiety 06/16/2014  . Acid reflux 06/16/2014    Past Surgical History  Procedure Laterality Date  . Urethra surgery  2000?    stricture?    Current Outpatient Rx  Name  Route  Sig  Dispense  Refill  .  albuterol (PROVENTIL HFA;VENTOLIN HFA) 108 (90 Base) MCG/ACT inhaler   Inhalation   Inhale into the lungs every 6 (six) hours as needed for wheezing or shortness of breath.         . clonazePAM (KLONOPIN) 0.5 MG tablet   Oral   Take 0.5 mg by mouth 3 (three) times daily as needed for anxiety.       0   . diazepam (VALIUM) 10 MG tablet   Oral   Take 1 tablet (10 mg total) by mouth once. Take 1 hour prior to procedure   1 tablet   0   . loratadine (CLARITIN) 10 MG tablet   Oral   Take 1 tablet (10 mg total) by mouth daily.   30 tablet   0   . naproxen (NAPROSYN) 500 MG tablet   Oral   Take 1 tablet (500 mg total) by mouth 2 (two) times daily with a meal.   14 tablet   0   . ondansetron (ZOFRAN ODT) 4 MG disintegrating tablet   Oral   Take 1 tablet (4 mg total) by mouth every 8 (eight) hours as needed for nausea or vomiting.   20 tablet   0   . ondansetron (ZOFRAN) 4 MG tablet   Oral   Take 1 tablet (4 mg total) by mouth every 8 (eight) hours as needed for nausea or vomiting.   10 tablet   1   . tamsulosin (FLOMAX) 0.4 MG CAPS capsule   Oral  Take 1 capsule (0.4 mg total) by mouth daily after breakfast.   20 capsule   0     Allergies Codeine  Family History  Problem Relation Age of Onset  . Bladder Cancer Neg Hx   . Kidney cancer Neg Hx   . Prostate cancer Neg Hx     Social History Social History  Substance Use Topics  . Smoking status: Current Some Day Smoker -- 0.00 packs/day    Types: Cigarettes  . Smokeless tobacco: Never Used  . Alcohol Use: Yes     Comment: socially     Review of Systems  Constitutional: No fever/chills, fatigue, diaphoresis Eyes: Positive redness about the right eye. No discharge, swelling, visual changes.  ENT: Positive nasal congestion, runny nose No sore throat. Cardiovascular: No chest pain, palpitations. Respiratory: Positive shortness of breath, wheezing. No cough, chest congestion, nocturnal dyspnea.    Gastrointestinal: No abdominal pain.  No nausea, vomiting.  No diarrhea.  No constipation. Genitourinary: Negative for dysuria, hematuria, urethral discharge. No urinary hesitancy, urgency or increased frequency. Musculoskeletal: Positive for right lower back pain and neck pain. Negative for extremity pain. Skin: Negative for rash, redness, skin sores. Neurological: Negative for headaches, focal weakness or numbness. No saddle paresthesias nor loss of bowel or bladder control. No tingling. 10-point ROS otherwise negative.  ____________________________________________   PHYSICAL EXAM:  VITAL SIGNS: ED Triage Vitals  Enc Vitals Group     BP 03/02/16 1522 129/80 mmHg     Pulse Rate 03/02/16 1522 76     Resp 03/02/16 1522 18     Temp 03/02/16 1522 98.5 F (36.9 C)     Temp Source 03/02/16 1522 Oral     SpO2 03/02/16 1522 98 %     Weight 03/02/16 1522 210 lb (95.255 kg)     Height 03/02/16 1522 5\' 8"  (1.727 m)     Head Cir --      Peak Flow --      Pain Score 03/02/16 1523 4     Pain Loc --      Pain Edu? --      Excl. in GC? --      Constitutional: Alert and oriented. Well appearing and in no acute distress. Eyes: Conjunctivae are normal. PERRL. EOMI without pain.  Head: Atraumatic. ENT:      Nose: Mild congestion with trace clear rhinnorhea. Bilateral turbinates injected.      Mouth/Throat: Mucous membranes are moist.  Neck: No stridor. No cervical spine tenderness to palpation. Supple with full range of motion. No carotid bruits. No trapezial muscle spasms to palpation. No cervical paraspinal tenderness. Hematological/Lymphatic/Immunilogical: No cervical lymphadenopathy. Cardiovascular: Normal rate, regular rhythm. Normal S1 and S2. No murmurs, rubs, gallops.  Good peripheral circulation with 2+ pulses in bilateral upper extremities. Respiratory: Normal respiratory effort without tachypnea or retractions. Lungs CTAB with breath sounds noted in all lung fields. No wheeze,  rhonchi, rales. Gastrointestinal:  No CVA tenderness. Musculoskeletal: Tenderness to palpation about the thoracic, lumbar, sacral spinal areas nor paraspinal regions. No muscle spasms to palpation about the entire back. Full range of motion of lumbar spine without pain. Neurologic:  Normal speech and language. No gross focal neurologic deficits are appreciated. Normal gait and posture. Skin:  Skin is warm, dry and intact. No rash noted. Psychiatric: Mood and affect are normal. Speech and behavior are normal. Patient exhibits appropriate insight and judgement.   ____________________________________________   LABS  None ____________________________________________  EKG  EKG with normal sinus rhythm  with a ventricular rate of 76 bpm. No evidence of STEMI or other acute changes. EKG was also reviewed by Dr. Ileana RoupJames McShane. ____________________________________________  RADIOLOGY I have personally viewed and evaluated these images (plain radiographs) as part of my medical decision making, as well as reviewing the written report by the radiologist.  Dg Chest 2 View  03/02/2016  CLINICAL DATA:  Cough and shortness of breath for 2-3 days. EXAM: CHEST  2 VIEW COMPARISON:  01/11/2016 FINDINGS: The heart size and mediastinal contours are within normal limits. Both lungs are clear. The visualized skeletal structures are unremarkable. IMPRESSION: No active cardiopulmonary disease. Electronically Signed   By: Kennith CenterEric  Mansell M.D.   On: 03/02/2016 15:53    ____________________________________________    PROCEDURES  Procedure(s) performed: None    Medications - No data to display   ____________________________________________   INITIAL IMPRESSION / ASSESSMENT AND PLAN / ED COURSE  Pertinent labs & imaging results that were available during my care of the patient were reviewed by me and considered in my medical decision making (see chart for details).  [place] controlled substance  database was queried and it is noted that the patient received #60 clonazepam 0.5 mg tablets dispensed on 02/15/2016 from prescriber Jodi Mourningeborah Bullard in [place]..  Patient's diagnosis is consistent with allergic conjunctivitis and rhinitis, uncomplicated asthma and musculoskeletal pain. Patient will be discharged home with prescriptions for Claritin and Naprosyn to take as directed. Patient may continue to use his pro-air rescue inhaler as needed. Patient is advised to establish care with a local primary care provider to handle nonemergent medical conditions. Patient was given a list of local primary care providers within his discharge notes in which he can call to establish care. Patient is to follow up with one of the outpatient local community clinics such as [place] [facility], [facility], [facility], or [facility] [place] if symptoms persist past this treatment course. Patient is given ED precautions to return to the ED for any worsening or new symptoms.    ____________________________________________  FINAL CLINICAL IMPRESSION(S) / ED DIAGNOSES  Final diagnoses:  Allergic conjunctivitis and rhinitis, right  Asthma, unspecified asthma severity, uncomplicated  Musculoskeletal pain      NEW MEDICATIONS STARTED DURING THIS VISIT:  Discharge Medication List as of 03/02/2016  4:05 PM    START taking these medications   Details  loratadine (CLARITIN) 10 MG tablet Take 1 tablet (10 mg total) by mouth daily., Starting 03/02/2016, Until Discontinued, Print    naproxen (NAPROSYN) 500 MG tablet Take 1 tablet (500 mg total) by mouth 2 (two) times daily with a meal., Starting 03/02/2016, Until Discontinued, Print             Hope PigeonJami L Hagler, PA-C 03/02/16 1631  Sharman CheekPhillip Stafford, MD 03/02/16 2003

## 2016-03-08 ENCOUNTER — Encounter: Payer: Self-pay | Admitting: Emergency Medicine

## 2016-03-08 ENCOUNTER — Emergency Department
Admission: EM | Admit: 2016-03-08 | Discharge: 2016-03-08 | Disposition: A | Discharge status: Home / Self Care | Payer: Self-pay | Attending: Emergency Medicine | Admitting: Emergency Medicine

## 2016-03-08 DIAGNOSIS — H109 Unspecified conjunctivitis: Secondary | ICD-10-CM

## 2016-03-08 DIAGNOSIS — J45909 Unspecified asthma, uncomplicated: Secondary | ICD-10-CM | POA: Insufficient documentation

## 2016-03-08 DIAGNOSIS — H1031 Unspecified acute conjunctivitis, right eye: Secondary | ICD-10-CM | POA: Insufficient documentation

## 2016-03-08 DIAGNOSIS — F17210 Nicotine dependence, cigarettes, uncomplicated: Secondary | ICD-10-CM | POA: Insufficient documentation

## 2016-03-08 MED ORDER — ERYTHROMYCIN 5 MG/GM OP OINT: TOPICAL_OINTMENT | Freq: Three times a day (TID) | OPHTHALMIC | Status: AC

## 2016-03-08 NOTE — ED Notes (Signed)
  Pt arrived to the ED for complaints of right eye pain and redness. Pt reports that he was seen in this ED for the same 3 days ago and was told that it was allergies. Pt reports that the symptoms are getting worse and would like to be checked. Pt is AOx4 in no apparent distress.

## 2016-03-08 NOTE — ED Provider Notes (Signed)
[facility] Emergency Department Provider Note    ____________________________________________  Time seen: ~0410  I have reviewed the triage vital signs and the nursing notes.   HISTORY  Chief Complaint Eye Injury   History limited by: Not Limited   HPI Douglas Singh is a 35 y.o. male who presents to the emergency department today because of concerns for continued right eye redness, itchiness and discomfort. The patient states it is been going on for over a week at this point. He was seen in the emergency department 6 days ago for this same complaint. He says he has been taking Claritin and using Clear Eyes since that time. Says neither of these have provided any relief. He says he thinks that the redness has spread more laterally. He does state that he has had discharge around the eye in the mornings. He denies any vision change. Denies any fevers. Denies any trauma or foreign body to the eye.   Past Medical History  Diagnosis Date  . Asthma   . Anxiety   . Reflux   . Panic attacks     Patient Active Problem List   Diagnosis Date Noted  . Panic attack 12/03/2015  . Anxiety 06/16/2014  . Acid reflux 06/16/2014    Past Surgical History  Procedure Laterality Date  . Urethra surgery  2000?    stricture?    Current Outpatient Rx  Name  Route  Sig  Dispense  Refill  . albuterol (PROVENTIL HFA;VENTOLIN HFA) 108 (90 Base) MCG/ACT inhaler   Inhalation   Inhale into the lungs every 6 (six) hours as needed for wheezing or shortness of breath.         . clonazePAM (KLONOPIN) 0.5 MG tablet   Oral   Take 0.5 mg by mouth 3 (three) times daily as needed for anxiety.       0   . diazepam (VALIUM) 10 MG tablet   Oral   Take 1 tablet (10 mg total) by mouth once. Take 1 hour prior to procedure   1 tablet   0   . loratadine (CLARITIN) 10 MG tablet   Oral   Take 1 tablet (10 mg total) by mouth daily.   30 tablet   0   . naproxen (NAPROSYN) 500 MG  tablet   Oral   Take 1 tablet (500 mg total) by mouth 2 (two) times daily with a meal.   14 tablet   0   . ondansetron (ZOFRAN ODT) 4 MG disintegrating tablet   Oral   Take 1 tablet (4 mg total) by mouth every 8 (eight) hours as needed for nausea or vomiting.   20 tablet   0   . ondansetron (ZOFRAN) 4 MG tablet   Oral   Take 1 tablet (4 mg total) by mouth every 8 (eight) hours as needed for nausea or vomiting.   10 tablet   1   . tamsulosin (FLOMAX) 0.4 MG CAPS capsule   Oral   Take 1 capsule (0.4 mg total) by mouth daily after breakfast.   20 capsule   0     Allergies Codeine  Family History  Problem Relation Age of Onset  . Bladder Cancer Neg Hx   . Kidney cancer Neg Hx   . Prostate cancer Neg Hx     Social History Social History  Substance Use Topics  . Smoking status: Current Some Day Smoker -- 0.00 packs/day    Types: Cigarettes  . Smokeless tobacco: Never  Used  . Alcohol Use: Yes     Comment: socially    Review of Systems  Constitutional: Negative for fever. Cardiovascular: Negative for chest pain. Respiratory: Negative for shortness of breath. Gastrointestinal: Negative for abdominal pain, vomiting and diarrhea. Neurological: Negative for headaches, focal weakness or numbness.  10-point ROS otherwise negative.  ____________________________________________   PHYSICAL EXAM:  VITAL SIGNS: ED Triage Vitals  Enc Vitals Group     BP 03/08/16 0105 134/99 mmHg     Pulse Rate 03/08/16 0105 70     Resp 03/08/16 0105 18     Temp 03/08/16 0105 98.3 F (36.8 C)     Temp Source 03/08/16 0105 Oral     SpO2 03/08/16 0102 99 %     Weight 03/08/16 0105 210 lb (95.255 kg)     Height 03/08/16 0105  (1.727 m)     Head Cir --      Peak Flow --      Pain Score 03/08/16 0106 7   Constitutional: Alert and oriented. Well appearing and in no distress. Eyes: Right eye with injected conjunctiva. No obvious discharge. Left eye within normal limits. PERRL.  Normal extraocular movements. ENT   Head: Normocephalic and atraumatic.   Nose: No congestion/rhinnorhea.   Mouth/Throat: Mucous membranes are moist.   Neck: No stridor. Hematological/Lymphatic/Immunilogical: No cervical lymphadenopathy. Cardiovascular: Normal rate, regular rhythm.  No murmurs, rubs, or gallops. Respiratory: Normal respiratory effort without tachypnea nor retractions. Breath sounds are clear and equal bilaterally. No wheezes/rales/rhonchi. Gastrointestinal: Soft and nontender. No distention.  Genitourinary: Deferred Musculoskeletal: Normal range of motion in all extremities.  Neurologic:  Normal speech and language. No gross focal neurologic deficits are appreciated.  Skin:  Skin is warm, dry and intact. No rash noted. Psychiatric: Mood and affect are normal. Speech and behavior are normal. Patient exhibits appropriate insight and judgment.  ____________________________________________    LABS (pertinent positives/negatives)  None  ____________________________________________   EKG  None  ____________________________________________    RADIOLOGY  None  ____________________________________________   PROCEDURES  Procedure(s) performed: None  Critical Care performed: No  ____________________________________________   INITIAL IMPRESSION / ASSESSMENT AND PLAN / ED COURSE  Pertinent labs & imaging results that were available during my care of the patient were reviewed by me and considered in my medical decision making (see chart for details).  Patient presented to the emergency department today with continued and worsening right eye redness and discomfort. On exam he does have injection to the right high. No obvious discharge. We will however give patient prescription for erythromycin ointment given continued symptoms will give patient ophthalmology follow-up.  ____________________________________________   FINAL CLINICAL IMPRESSION(S)  / ED DIAGNOSES  Final diagnoses:  Conjunctivitis of right eye     Note: This dictation was prepared with Dragon dictation. Any transcriptional errors that result from this process are unintentional    Phineas Semen, MD 03/08/16 0423

## 2016-03-08 NOTE — ED Notes (Signed)

## 2016-03-08 NOTE — Discharge Instructions (Signed)
Please seek medical attention for any high fevers, chest pain, shortness of breath, change in behavior, persistent vomiting, bloody stool or any other new or concerning symptoms.   Bacterial Conjunctivitis Bacterial conjunctivitis (commonly called pink eye) is redness, soreness, or puffiness (inflammation) of the white part of your eye. It is caused by a germ called bacteria. These germs can easily spread from person to person (contagious). Your eye often will become red or pink. Your eye may also become irritated, watery, or have a thick discharge.  HOME CARE   Apply a cool, clean washcloth over closed eyelids. Do this for 10-20 minutes, 3-4 times a day while you have pain.  Gently wipe away any fluid coming from the eye with a warm, wet washcloth or cotton ball.  Wash your hands often with soap and water. Use paper towels to dry your hands.  Do not share towels or washcloths.  Change or wash your pillowcase every day.  Do not use eye makeup until the infection is gone.  Do not use machines or drive if your vision is blurry.  Stop using contact lenses. Do not use them again until your doctor says it is okay.  Do not touch the tip of the eye drop bottle or medicine tube with your fingers when you put medicine on the eye. GET HELP RIGHT AWAY IF:   Your eye is not better after 3 days of starting your medicine.  You have a yellowish fluid coming out of the eye.  You have more pain in the eye.  Your eye redness is spreading.  Your vision becomes blurry.  You have a fever or lasting symptoms for more than 2-3 days.  You have a fever and your symptoms suddenly get worse.  You have pain in the face.  Your face gets red or puffy (swollen). MAKE SURE YOU:   Understand these instructions.  Will watch this condition.  Will get help right away if you are not doing well or get worse.   This information is not intended to replace advice given to you by your health care provider.  Make sure you discuss any questions you have with your health care provider.   Document Released: 05/24/2008 Document Revised: 08/01/2012 Document Reviewed: 04/20/2012 Elsevier Interactive Patient Education [employer]2016 Elsevier Inc.

## 2016-03-14 ENCOUNTER — Emergency Department
Admission: EM | Admit: 2016-03-14 | Discharge: 2016-03-14 | Disposition: A | Discharge status: Home / Self Care | Payer: Self-pay | Attending: Emergency Medicine | Admitting: Emergency Medicine

## 2016-03-14 DIAGNOSIS — J45909 Unspecified asthma, uncomplicated: Secondary | ICD-10-CM | POA: Insufficient documentation

## 2016-03-14 DIAGNOSIS — F17210 Nicotine dependence, cigarettes, uncomplicated: Secondary | ICD-10-CM | POA: Insufficient documentation

## 2016-03-14 DIAGNOSIS — H109 Unspecified conjunctivitis: Secondary | ICD-10-CM

## 2016-03-14 DIAGNOSIS — H1031 Unspecified acute conjunctivitis, right eye: Secondary | ICD-10-CM | POA: Insufficient documentation

## 2016-03-14 DIAGNOSIS — Z79899 Other long term (current) drug therapy: Secondary | ICD-10-CM | POA: Insufficient documentation

## 2016-03-14 MED ORDER — EYE WASH OPHTH SOLN
OPHTHALMIC | Status: AC
  Filled 2016-03-14: qty 118

## 2016-03-14 MED ORDER — SULFACETAMIDE SODIUM 10 % OP SOLN
2.0000 [drp] | Freq: Four times a day (QID) | OPHTHALMIC | Status: DC
Start: 2016-03-14 — End: 2017-09-12

## 2016-03-14 MED ORDER — TETRACAINE HCL 0.5 % OP SOLN
OPHTHALMIC | Status: AC
  Filled 2016-03-14: qty 2

## 2016-03-14 MED ORDER — FLUORESCEIN SODIUM 1 MG OP STRP
ORAL_STRIP | OPHTHALMIC | Status: AC
  Filled 2016-03-14: qty 1

## 2016-03-14 NOTE — ED Notes (Signed)
See triage note  States he was seen last week for same  But feels like infection is spreading to both eyes  drianage and irritation

## 2016-03-14 NOTE — Discharge Instructions (Signed)
Bacterial Conjunctivitis °Bacterial conjunctivitis, commonly called pink eye, is an inflammation of the clear membrane that covers the white part of the eye (conjunctiva). The inflammation can also happen on the underside of the eyelids. The blood vessels in the conjunctiva become inflamed, causing the eye to become red or pink. Bacterial conjunctivitis may spread easily from one eye to another and from person to person (contagious).  °CAUSES  °Bacterial conjunctivitis is caused by bacteria. The bacteria may come from your own skin, your upper respiratory tract, or from someone else with bacterial conjunctivitis. °SYMPTOMS  °The normally white color of the eye or the underside of the eyelid is usually pink or red. The pink eye is usually associated with irritation, tearing, and some sensitivity to light. Bacterial conjunctivitis is often associated with a thick, yellowish discharge from the eye. The discharge may turn into a crust on the eyelids overnight, which causes your eyelids to stick together. If a discharge is present, there may also be some blurred vision in the affected eye. °DIAGNOSIS  °Bacterial conjunctivitis is diagnosed by your caregiver through an eye exam and the symptoms that you report. Your caregiver looks for changes in the surface tissues of your eyes, which may point to the specific type of conjunctivitis. A sample of any discharge may be collected on a cotton-tip swab if you have a severe case of conjunctivitis, if your cornea is affected, or if you keep getting repeat infections that do not respond to treatment. The sample will be sent to a lab to see if the inflammation is caused by a bacterial infection and to see if the infection will respond to antibiotic medicines. °TREATMENT  °1. Bacterial conjunctivitis is treated with antibiotics. Antibiotic eyedrops are most often used. However, antibiotic ointments are also available. Antibiotics pills are sometimes used. Artificial tears or eye  washes may ease discomfort. °HOME CARE INSTRUCTIONS  °1. To ease discomfort, apply a cool, clean washcloth to your eye for 10-20 minutes, 3-4 times a day. °2. Gently wipe away any drainage from your eye with a warm, wet washcloth or a cotton ball. °3. Wash your hands often with soap and water. Use paper towels to dry your hands. °4. Do not share towels or washcloths. This may spread the infection. °5. Change or wash your pillowcase every day. °6. You should not use eye makeup until the infection is gone. °7. Do not operate machinery or drive if your vision is blurred. °8. Stop using contact lenses. Ask your caregiver how to sterilize or replace your contacts before using them again. This depends on the type of contact lenses that you use. °9. When applying medicine to the infected eye, do not touch the edge of your eyelid with the eyedrop bottle or ointment tube. °SEEK IMMEDIATE MEDICAL CARE IF:  °· Your infection has not improved within 3 days after beginning treatment. °· You had yellow discharge from your eye and it returns. °· You have increased eye pain. °· Your eye redness is spreading. °· Your vision becomes blurred. °· You have a fever or persistent symptoms for more than 2-3 days. °· You have a fever and your symptoms suddenly get worse. °· You have facial pain, redness, or swelling. °MAKE SURE YOU:  °· Understand these instructions. °· Will watch your condition. °· Will get help right away if you are not doing well or get worse. °  °This information is not intended to replace advice given to you by your health care provider. Make sure you   discuss any questions you have with your health care provider. °  °Document Released: 08/15/2005 Document Revised: 09/05/2014 Document Reviewed: 01/16/2012 °Elsevier Interactive Patient Education ©2016 Elsevier Inc. ° °How to Use Eye Drops and Eye Ointments °HOW TO APPLY EYE DROPS °Follow these steps when applying eye drops: °2. Wash your hands. °3. Tilt your head  back. °4. Put a finger under your eye and use it to gently pull your lower lid downward. Keep that finger in place. °5. Using your other hand, hold the dropper between your thumb and index finger. °6. Position the dropper just over the edge of the lower lid. Hold it as close to your eye as you can without touching the dropper to your eye. °7. Steady your hand. One way to do this is to lean your index finger against your brow. °8. Look up. °9. Slowly and gently squeeze one drop of medicine into your eye. °10. Close your eye. °11. Place a finger between your lower eyelid and your nose. Press gently for 2 minutes. This increases the amount of time that the medicine is exposed to the eye. It also reduces side effects that can develop if the drop gets into the bloodstream through the nose. °HOW TO APPLY EYE OINTMENTS °Follow these steps when applying eye ointments: °10. Wash your hands. °11. Put a finger under your eye and use it to gently pull your lower lid downward. Keep that finger in place. °12. Using your other hand, place the tip of the tube between your thumb and index finger with the remaining fingers braced against your cheek or nose. °13. Hold the tube just over the edge of your lower lid without touching the tube to your lid or eyeball. °14. Look up. °15. Line the inner part of your lower lid with ointment. °16. Gently pull up on your upper lid and look down. This will force the ointment to spread over the surface of the eye. °17. Release the upper lid. °18. If you can, close your eyes for 1-2 minutes. °Do not rub your eyes. If you applied the ointment correctly, your vision will be blurry for a few minutes. This is normal. °ADDITIONAL INFORMATION °· Make sure to use the eye drops or ointment as told by your health care provider. °· If you have been told to use both eye drops and an eye ointment, apply the eye drops first, then wait 3-4 minutes before you apply the ointment. °· Try not to touch the tip of the  dropper or tube to your eye. A dropper or tube that has touched the eye can become contaminated. °  °This information is not intended to replace advice given to you by your health care provider. Make sure you discuss any questions you have with your health care provider. °  °Document Released: 11/21/2000 Document Revised: 12/30/2014 Document Reviewed: 08/11/2014 °Elsevier Interactive Patient Education ©2016 Elsevier Inc. ° °

## 2016-03-14 NOTE — ED Notes (Signed)
Patient reports he was seen in the ED approximately a week ago and given antibiotic eye ointment and states eye not getting any better.

## 2016-03-14 NOTE — ED Provider Notes (Signed)
[facility] Emergency Department Provider Note ____________________________________________  Time seen: Approximately 7:30 AM  I have reviewed the triage vital signs and the nursing notes.   HISTORY  Chief Complaint Eye Problem  HPI Douglas Singh is a 35 y.o. male presenting with bilateral eye pain. Patient was seen in the ED 7 days ago and put on erythromycin ointment for bacterial conjunctivitis. He states he has been taking medication as directed but it hasn't provided any relief. Right eye is red and itchy with green discharge and crusting. Complains of "feeling like something is in his eye or it's scratched". Yesterday, his left eye became irritated, red, and itchy. States that there is currently no discharge coming from that eye. Patient currently has not attempted palliative measures for the left eye. Patient denies contact use, seasonal allergies, or recent viral illness. Denies visual changes, headache, or sore throat.   Past Medical History  Diagnosis Date  . Asthma   . Anxiety   . Reflux   . Panic attacks     Patient Active Problem List   Diagnosis Date Noted  . Panic attack 12/03/2015  . Anxiety 06/16/2014  . Acid reflux 06/16/2014    Past Surgical History  Procedure Laterality Date  . Urethra surgery  2000?    stricture?    Current Outpatient Rx  Name  Route  Sig  Dispense  Refill  . albuterol (PROVENTIL HFA;VENTOLIN HFA) 108 (90 Base) MCG/ACT inhaler   Inhalation   Inhale into the lungs every 6 (six) hours as needed for wheezing or shortness of breath.         . clonazePAM (KLONOPIN) 0.5 MG tablet   Oral   Take 0.5 mg by mouth 3 (three) times daily as needed for anxiety.       0   . erythromycin [facility](ROMYCIN) ophthalmic ointment   Right Eye   Place into the right eye 3 (three) times daily. Place a 1/2 inch ribbon of ointment into the lower eyelid.   3.5 g   0   . loratadine (CLARITIN) 10 MG tablet   Oral   Take 1 tablet (10 mg  total) by mouth daily.   30 tablet   0   . sulfacetamide (BLEPH-10) 10 % ophthalmic solution   Both Eyes   Place 2 drops into both eyes 4 (four) times daily.   5 mL   0     Allergies Codeine  Family History  Problem Relation Age of Onset  . Bladder Cancer Neg Hx   . Kidney cancer Neg Hx   . Prostate cancer Neg Hx     Social History Social History  Substance Use Topics  . Smoking status: Current Some Day Smoker -- 0.00 packs/day    Types: Cigarettes  . Smokeless tobacco: Never Used  . Alcohol Use: Yes     Comment: socially    Review of Systems Constitutional: No fever/chills. Eyes: No visual changes. ENT: No sore throat. Cardiovascular: Denies chest pain. Respiratory: Denies shortness of breath. Gastrointestinal: No abdominal pain.  No nausea, no vomiting.   Neurological: Negative for headaches, focal weakness or numbness.  10-point ROS otherwise negative.  ____________________________________________   PHYSICAL EXAM:  VITAL SIGNS: ED Triage Vitals  Enc Vitals Group     BP 03/14/16 0623 141/87 mmHg     Pulse Rate 03/14/16 0623 76     Resp 03/14/16 0623 18     Temp 03/14/16 0623 97.8 F (36.6 C)     Temp  Source 03/14/16 0623 Oral     SpO2 03/14/16 0623 99 %     Weight 03/14/16 0623 200 lb (90.719 kg)     Height 03/14/16 0623  (1.727 m)     Head Cir --      Peak Flow --      Pain Score 03/14/16 0623 0     Pain Loc --      Pain Edu? --      Excl. in GC? --    Constitutional: Alert and oriented. Well appearing and in no acute distress. Eyes: Conjunctivae are injected bilaterally. PERRL.  Head: Atraumatic. Nose: No congestion/rhinnorhea. Mouth/Throat: Mucous membranes are moist.  Oropharynx non-erythematous. Cardiovascular: Normal rate, regular rhythm.  Respiratory: Normal respiratory effort.  No retractions. Lungs CTAB. Neurologic:  Normal speech and language. No gross focal neurologic deficits are appreciated. No gait instability. Skin:   Skin is warm, dry and intact. No rash noted. Psychiatric: Mood and affect are normal. Speech and behavior are normal.  ____________________________________________   LABS (all labs ordered are listed, but only abnormal results are displayed)  Labs Reviewed - No data to display ____________________________________________  PROCEDURES  Procedure(s) performed: None  Critical Care performed: No  ____________________________________________   INITIAL IMPRESSION / ASSESSMENT AND PLAN / ED COURSE  Pertinent labs & imaging results that were available during my care of the patient were reviewed by me and considered in my medical decision making (see chart for details).  Bacterial conjunctivitis unresolved with erythromycin ointment. Prescribed BLEPH-10, 2 drops into both eyes 4 time/day. Follow up with PCP or return to ER if symptoms remain unresolved or worsen.   ____________________________________________   FINAL CLINICAL IMPRESSION(S) / ED DIAGNOSES  Final diagnoses:  Conjunctivitis of right eye     This chart was dictated using voice recognition software/Dragon. Despite best efforts to proofread, errors can occur which can change the meaning. Any change was purely unintentional.   Evangeline Dakin, PA-C 03/14/16 1423  Sharman Cheek, MD 03/14/16 1535

## 2016-03-28 ENCOUNTER — Emergency Department
Admission: EM | Admit: 2016-03-28 | Discharge: 2016-03-28 | Disposition: A | Discharge status: Home / Self Care | Payer: Self-pay | Attending: Emergency Medicine | Admitting: Emergency Medicine

## 2016-03-28 DIAGNOSIS — T679XXA Effect of heat and light, unspecified, initial encounter: Secondary | ICD-10-CM | POA: Insufficient documentation

## 2016-03-28 DIAGNOSIS — R0602 Shortness of breath: Secondary | ICD-10-CM

## 2016-03-28 DIAGNOSIS — F17210 Nicotine dependence, cigarettes, uncomplicated: Secondary | ICD-10-CM | POA: Insufficient documentation

## 2016-03-28 DIAGNOSIS — Z79899 Other long term (current) drug therapy: Secondary | ICD-10-CM | POA: Insufficient documentation

## 2016-03-28 DIAGNOSIS — J45909 Unspecified asthma, uncomplicated: Secondary | ICD-10-CM | POA: Insufficient documentation

## 2016-03-28 NOTE — ED Provider Notes (Signed)
[facility] Emergency Department Provider Note  ____________________________________________  Time seen: Approximately 10:22 AM  I have reviewed the triage vital signs and the nursing notes.   HISTORY  Chief Complaint Shortness of Breath    HPI Douglas Singh is a 35 y.o. male presents for evaluation of shortness of breath and being overheated at work today. 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 He thinks he may have gotten too hot. Here for evaluation. Patient states he feels much much better.   Past Medical History:  Diagnosis Date  . Anxiety   . Asthma   . Panic attacks   . Reflux     Patient Active Problem List   Diagnosis Date Noted  . Panic attack 12/03/2015  . Anxiety 06/16/2014  . Acid reflux 06/16/2014    Past Surgical History:  Procedure Laterality Date  . URETHRA SURGERY  2000?   stricture?    Prior to Admission medications   Medication Sig Start Date End Date Taking? Authorizing Provider  albuterol (PROVENTIL HFA;VENTOLIN HFA) 108 (90 Base) MCG/ACT inhaler Inhale into the lungs every 6 (six) hours as needed for wheezing or shortness of breath.    Historical Provider, MD  clonazePAM (KLONOPIN) 0.5 MG tablet Take 0.5 mg by mouth 3 (three) times daily as needed for anxiety.  11/29/14   Historical Provider, MD  loratadine (CLARITIN) 10 MG tablet Take 1 tablet (10 mg total) by mouth daily. 03/02/16   Jami L Hagler, PA-C  sulfacetamide (BLEPH-10) 10 % ophthalmic solution Place 2 drops into both eyes 4 (four) times daily. 03/14/16   Evangeline Dakin, PA-C    Allergies Codeine  Family History  Problem Relation Age of Onset  . Bladder Cancer Neg Hx   . Kidney cancer Neg Hx   . Prostate cancer Neg Hx     Social History Social History  Substance Use Topics  . Smoking status: Current Some Day Smoker    Packs/day: 0.00    Types: Cigarettes  . Smokeless tobacco:  Never Used  . Alcohol use Yes     Comment: socially    Review of Systems Constitutional: No fever/chills Eyes: No visual changes. ENT: No sore throat. Cardiovascular: Denies chest pain. Respiratory: Positive for shortness of breath Gastrointestinal: No abdominal pain.  No nausea, no vomiting.  No diarrhea.  No constipation. Genitourinary: Negative for dysuria. Musculoskeletal: Negative for back pain. Skin: Negative for rash. Neurological: Negative for headaches, focal weakness or numbness. Positive mild dizziness earlier 10-point ROS otherwise negative.  ____________________________________________   PHYSICAL EXAM:  VITAL SIGNS: ED Triage Vitals [03/28/16 1015]  Enc Vitals Group     BP 138/78     Pulse Rate 89     Resp 18     Temp 98.3 F (36.8 C)     Temp Source Oral     SpO2 99 %     Weight 200 lb (90.7 kg)     Height  (1.727 m)     Head Circumference      Peak Flow      Pain Score      Pain Loc      Pain Edu?      Excl. in GC?     Constitutional: Alert and oriented. Well appearing and in no acute distress. Eyes: Conjunctivae are normal. PERRL. EOMI. Head: Atraumatic. Nose: No congestion/rhinnorhea. Mouth/Throat: Mucous membranes are moist.  Oropharynx non-erythematous. Neck: No stridor.  Cardiovascular: Normal rate, regular rhythm. Grossly normal heart sounds.  Good peripheral circulation. Respiratory: Normal respiratory effort.  No retractions. Lungs CTAB. Musculoskeletal: No lower extremity tenderness nor edema.  No joint effusions. Neurologic:  Normal speech and language. No gross focal neurologic deficits are appreciated. No gait instability. Skin:  Skin is warm, dry and intact. No rash noted. Psychiatric: Mood and affect are normal. Speech and behavior are normal.  ____________________________________________   LABS (all labs ordered are listed, but only abnormal results are displayed)  Labs Reviewed - No data to  display ____________________________________________  EKG   ____________________________________________  RADIOLOGY   ____________________________________________   PROCEDURES  Procedure(s) performed: None  Critical Care performed: No  ____________________________________________   INITIAL IMPRESSION / ASSESSMENT AND PLAN / ED COURSE  Pertinent labs & imaging results that were available during my care of the patient were reviewed by me and considered in my medical decision making (see chart for details).  Shortness of breath. Episode probably due to overheating and working over the grill. Encourage patient to continue his albuterol stay in her condition environment for the next 24 hours purchase a clean towel to wear around his neck when he goes to work at the [employer]. No further workup necessary since patient feels fine today.  Clinical Course    ____________________________________________   FINAL CLINICAL IMPRESSION(S) / ED DIAGNOSES  Final diagnoses:  SOB (shortness of breath)  Heat effects of, initial encounter     This chart was dictated using voice recognition software/Dragon. Despite best efforts to proofread, errors can occur which can change the meaning. Any change was purely unintentional.    Evangeline Dakin, PA-C 03/28/16 [address] Travas Schexnayder, PA-C 03/28/16 1029    Sharyn Creamer, MD 03/28/16 [phone]

## 2016-03-28 NOTE — ED Triage Notes (Signed)
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  States he feels better now.  NAD

## 2016-06-23 ENCOUNTER — Emergency Department
Admission: EM | Admit: 2016-06-23 | Discharge: 2016-06-23 | Disposition: A | Discharge status: Home / Self Care | Payer: Self-pay | Attending: Emergency Medicine | Admitting: Emergency Medicine

## 2016-06-23 ENCOUNTER — Emergency Department: Payer: Self-pay

## 2016-06-23 ENCOUNTER — Encounter: Payer: Self-pay | Admitting: Emergency Medicine

## 2016-06-23 DIAGNOSIS — Z79899 Other long term (current) drug therapy: Secondary | ICD-10-CM | POA: Insufficient documentation

## 2016-06-23 DIAGNOSIS — R079 Chest pain, unspecified: Secondary | ICD-10-CM | POA: Insufficient documentation

## 2016-06-23 DIAGNOSIS — F17210 Nicotine dependence, cigarettes, uncomplicated: Secondary | ICD-10-CM | POA: Insufficient documentation

## 2016-06-23 DIAGNOSIS — Z5321 Procedure and treatment not carried out due to patient leaving prior to being seen by health care provider: Secondary | ICD-10-CM | POA: Insufficient documentation

## 2016-06-23 DIAGNOSIS — J45909 Unspecified asthma, uncomplicated: Secondary | ICD-10-CM | POA: Insufficient documentation

## 2016-06-23 LAB — BASIC METABOLIC PANEL
Anion gap: 7 (ref 5–15)
BUN: 13 mg/dL (ref 6–20)
CHLORIDE: 103 mmol/L (ref 101–111)
CO2: 26 mmol/L (ref 22–32)
CREATININE: 0.79 mg/dL (ref 0.61–1.24)
Calcium: 9.2 mg/dL (ref 8.9–10.3)
Glucose, Bld: 108 mg/dL — ABNORMAL HIGH (ref 65–99)
POTASSIUM: 3.8 mmol/L (ref 3.5–5.1)
SODIUM: 136 mmol/L (ref 135–145)

## 2016-06-23 LAB — CBC
HEMATOCRIT: 43.8 % (ref 40.0–52.0)
Hemoglobin: 15.2 g/dL (ref 13.0–18.0)
MCH: 30.2 pg (ref 26.0–34.0)
MCHC: 34.7 g/dL (ref 32.0–36.0)
MCV: 87 fL (ref 80.0–100.0)
PLATELETS: 202 10*3/uL (ref 150–440)
RBC: 5.03 MIL/uL (ref 4.40–5.90)
RDW: 14.2 % (ref 11.5–14.5)
WBC: 5.6 10*3/uL (ref 3.8–10.6)

## 2016-06-23 LAB — TROPONIN I: Troponin I: <0.03 ng/mL (ref <0.03)

## 2016-06-23 NOTE — ED Notes (Signed)
Pt was called for xray and did not answer.

## 2016-06-23 NOTE — ED Notes (Signed)
ECG done @ 1245 Dr Roxan Hockeyobinson

## 2016-06-23 NOTE — ED Triage Notes (Signed)
Pt to ED c/o left sided chest pain for several days.  States pain is sharp left left lower chest that comes and goes.  States "feels like gas or bubbly".  Reports intermittent SOB and diarrhea, denies n/v or radiation of pain.  Pt is A&Ox4, speaking in complete and coherent sentences, chest rise even and unlabored.

## 2016-08-23 ENCOUNTER — Encounter: Payer: Self-pay | Admitting: Medical Oncology

## 2016-08-23 ENCOUNTER — Emergency Department
Admission: EM | Admit: 2016-08-23 | Discharge: 2016-08-23 | Disposition: A | Discharge status: Home / Self Care | Payer: Self-pay | Attending: Emergency Medicine | Admitting: Emergency Medicine

## 2016-08-23 DIAGNOSIS — R059 Cough, unspecified: Secondary | ICD-10-CM

## 2016-08-23 DIAGNOSIS — J45909 Unspecified asthma, uncomplicated: Secondary | ICD-10-CM | POA: Insufficient documentation

## 2016-08-23 DIAGNOSIS — Z79899 Other long term (current) drug therapy: Secondary | ICD-10-CM | POA: Insufficient documentation

## 2016-08-23 DIAGNOSIS — R05 Cough: Secondary | ICD-10-CM | POA: Insufficient documentation

## 2016-08-23 DIAGNOSIS — F17210 Nicotine dependence, cigarettes, uncomplicated: Secondary | ICD-10-CM | POA: Insufficient documentation

## 2016-08-23 DIAGNOSIS — R0981 Nasal congestion: Secondary | ICD-10-CM | POA: Insufficient documentation

## 2016-08-23 MED ORDER — BENZONATATE 100 MG PO CAPS
200.0000 mg | ORAL_CAPSULE | Freq: Once | ORAL | Status: AC
  Administered 2016-08-23: 200 mg via ORAL
  Filled 2016-08-23: qty 2

## 2016-08-23 MED ORDER — IBUPROFEN 600 MG PO TABS: 600.0000 mg | ORAL_TABLET | Freq: Three times a day (TID) | ORAL | 0 refills | Status: DC | PRN

## 2016-08-23 MED ORDER — IBUPROFEN 600 MG PO TABS
600.0000 mg | ORAL_TABLET | Freq: Once | ORAL | Status: AC
  Administered 2016-08-23: 600 mg via ORAL
  Filled 2016-08-23: qty 1

## 2016-08-23 MED ORDER — PSEUDOEPH-BROMPHEN-DM 30-2-10 MG/5ML PO SYRP: 5.0000 mL | ORAL_SOLUTION | Freq: Four times a day (QID) | ORAL | 0 refills | Status: DC | PRN

## 2016-08-23 NOTE — ED Notes (Addendum)
Pt to ed with c/o cough, cold symptoms x 2 days.  Denies fever.  Pt states today awoke with increased congestion and cough and difficulty getting up congestion. Pt states laying flat makes congestion worse.  Pt states uses inhaler without relief. Pt appears in no acute resp distress,  Breathing easy, laying flat on the bed. No coughing noted during assessment.  Skin warm and dry.

## 2016-08-23 NOTE — ED Triage Notes (Signed)
Pt reports cough and cold sx's x 1 week.

## 2016-08-23 NOTE — ED Provider Notes (Signed)
[facility] Emergency Department Provider Note   ____________________________________________   First MD Initiated Contact with Patient 08/23/16 [phone]     (approximate)  I have reviewed the triage vital signs and the nursing notes.   HISTORY  Chief Complaint Cough and URI    HPI Douglas Singh is a 35 y.o. male patient complaining of sinus congestion and cough for 1 week. He denies any fever this complaint. Patient's symptoms has worsening the past 2 days. Patient state sinus and chest congestion and increase land down. Patient state has a history of asthma but his inhaler has not relieved his complaint.   Past Medical History:  Diagnosis Date  . Anxiety   . Asthma   . Panic attacks   . Reflux     Patient Active Problem List   Diagnosis Date Noted  . Panic attack 12/03/2015  . Anxiety 06/16/2014  . Acid reflux 06/16/2014    Past Surgical History:  Procedure Laterality Date  . URETHRA SURGERY  2000?   stricture?    Prior to Admission medications   Medication Sig Start Date End Date Taking? Authorizing Provider  albuterol (PROVENTIL HFA;VENTOLIN HFA) 108 (90 Base) MCG/ACT inhaler Inhale into the lungs every 6 (six) hours as needed for wheezing or shortness of breath.    Historical Provider, MD  brompheniramine-pseudoephedrine-DM 30-2-10 MG/5ML syrup Take 5 mLs by mouth 4 (four) times daily as needed. 08/23/16   Joni Reiningonald K Teryn Boerema, PA-C  clonazePAM (KLONOPIN) 0.5 MG tablet Take 0.5 mg by mouth 3 (three) times daily as needed for anxiety.  11/29/14   Historical Provider, MD  ibuprofen (ADVIL,MOTRIN) 600 MG tablet Take 1 tablet (600 mg total) by mouth every 8 (eight) hours as needed. 08/23/16   Joni Reiningonald K Dakota Vanwart, PA-C  loratadine (CLARITIN) 10 MG tablet Take 1 tablet (10 mg total) by mouth daily. 03/02/16   Jami L Hagler, PA-C  sulfacetamide (BLEPH-10) 10 % ophthalmic solution Place 2 drops into both eyes 4 (four) times daily. 03/14/16   Evangeline Dakinharles M Beers, PA-C      Allergies Codeine  Family History  Problem Relation Age of Onset  . Bladder Cancer Neg Hx   . Kidney cancer Neg Hx   . Prostate cancer Neg Hx     Social History Social History  Substance Use Topics  . Smoking status: Current Every Day Smoker    Packs/day: 0.50    Types: Cigarettes  . Smokeless tobacco: Never Used  . Alcohol use Yes     Comment: socially on weekends    Review of Systems Constitutional: No fever/chills Eyes: No visual changes. ENT: No sore throat.Sinus congestion Cardiovascular: Denies chest pain. Respiratory: Denies shortness of breath. Nonproductive cough Gastrointestinal: No abdominal pain.  No nausea, no vomiting.  No diarrhea.  No constipation. Genitourinary: Negative for dysuria. Musculoskeletal: Negative for back pain. Skin: Negative for rash. Neurological: Negative for headaches, focal weakness or numbness.    ____________________________________________   PHYSICAL EXAM:  VITAL SIGNS: ED Triage Vitals  Enc Vitals Group     BP 08/23/16 0727 (!) 154/89     Pulse Rate 08/23/16 0727 89     Resp 08/23/16 0727 17     Temp 08/23/16 0727 98.1 F (36.7 C)     Temp src --      SpO2 08/23/16 0727 97 %     Weight 08/23/16 0726 220 lb (99.8 kg)     Height 08/23/16 0726 5\' 8"  (1.727 m)     Head Circumference --  Peak Flow --      Pain Score --      Pain Loc --      Pain Edu? --      Excl. in GC? --     Constitutional: Alert and oriented. Well appearing and in no acute distress. Eyes: Conjunctivae are normal. PERRL. EOMI. Head: Atraumatic. Nose: Edematous nasal turbinates with clear rhinorrhea. Bilateral maxillary guarding.  Mouth/Throat: Mucous membranes are moist.  Oropharynx non-erythematous. Neck: No stridor.  No cervical spine tenderness to palpation. Hematological/Lymphatic/Immunilogical: No cervical lymphadenopathy. Cardiovascular: Normal rate, regular rhythm. Grossly normal heart sounds.  Good peripheral  circulation. Respiratory: Normal respiratory effort.  No retractions. Lungs CTAB. Nonproductive cough Gastrointestinal: Soft and nontender. No distention. No abdominal bruits. No CVA tenderness. Musculoskeletal: No lower extremity tenderness nor edema.  No joint effusions. Neurologic:  Normal speech and language. No gross focal neurologic deficits are appreciated. No gait instability. Skin:  Skin is warm, dry and intact. No rash noted. Psychiatric: Mood and affect are normal. Speech and behavior are normal.  ____________________________________________   LABS (all labs ordered are listed, but only abnormal results are displayed)  Labs Reviewed - No data to display ____________________________________________  EKG   ____________________________________________  RADIOLOGY   ____________________________________________   PROCEDURES  Procedure(s) performed: None  Procedures  Critical Care performed: No  ____________________________________________   INITIAL IMPRESSION / ASSESSMENT AND PLAN / ED COURSE  Pertinent labs & imaging results that were available during my care of the patient were reviewed by me and considered in my medical decision making (see chart for details).  Sinus congestion and cough. Patient given discharge care instructions. Patient given a prescription for Bromfed-DM and ibuprofen. Patient advised to follow-up with "clinic if condition persists.  Clinical Course      ____________________________________________   FINAL CLINICAL IMPRESSION(S) / ED DIAGNOSES  Final diagnoses:  Sinus congestion  Cough      NEW MEDICATIONS STARTED DURING THIS VISIT:  New Prescriptions   BROMPHENIRAMINE-PSEUDOEPHEDRINE-DM 30-2-10 MG/5ML SYRUP    Take 5 mLs by mouth 4 (four) times daily as needed.   IBUPROFEN (ADVIL,MOTRIN) 600 MG TABLET    Take 1 tablet (600 mg total) by mouth every 8 (eight) hours as needed.     Note:  This document was prepared using  Dragon voice recognition software and may include unintentional dictation errors.    Joni Reiningonald K Berenize Gatlin, PA-C 08/23/16 [phone]    Governor Rooksebecca Lord, MD 08/23/16 [phone]

## 2016-08-31 ENCOUNTER — Encounter: Payer: Self-pay | Admitting: Emergency Medicine

## 2016-08-31 DIAGNOSIS — F17210 Nicotine dependence, cigarettes, uncomplicated: Secondary | ICD-10-CM | POA: Insufficient documentation

## 2016-08-31 DIAGNOSIS — Z79899 Other long term (current) drug therapy: Secondary | ICD-10-CM | POA: Insufficient documentation

## 2016-08-31 DIAGNOSIS — J069 Acute upper respiratory infection, unspecified: Secondary | ICD-10-CM | POA: Insufficient documentation

## 2016-08-31 DIAGNOSIS — J45909 Unspecified asthma, uncomplicated: Secondary | ICD-10-CM | POA: Insufficient documentation

## 2016-08-31 NOTE — ED Triage Notes (Signed)
Pt ambulatory to triage in NAD, report cold/flu sx x 1 week, nasal congestion, productive cough, report fever tx at home w/ ibuprofen about 1 hour ago.

## 2016-09-01 ENCOUNTER — Emergency Department: Payer: Self-pay

## 2016-09-01 ENCOUNTER — Emergency Department
Admission: EM | Admit: 2016-09-01 | Discharge: 2016-09-01 | Disposition: A | Discharge status: Home / Self Care | Payer: Self-pay | Attending: Emergency Medicine | Admitting: Emergency Medicine

## 2016-09-01 DIAGNOSIS — J069 Acute upper respiratory infection, unspecified: Secondary | ICD-10-CM

## 2016-09-01 LAB — RAPID INFLUENZA A&B ANTIGENS (ARMC ONLY): INFLUENZA A (ARMC): NEGATIVE

## 2016-09-01 LAB — RAPID INFLUENZA A&B ANTIGENS: Influenza B (ARMC): NEGATIVE

## 2016-09-01 MED ORDER — BENZONATATE 100 MG PO CAPS
200.0000 mg | ORAL_CAPSULE | Freq: Once | ORAL | Status: AC
  Administered 2016-09-01: 200 mg via ORAL
  Filled 2016-09-01: qty 2

## 2016-09-01 MED ORDER — BENZONATATE 100 MG PO CAPS: 100.0000 mg | ORAL_CAPSULE | Freq: Four times a day (QID) | ORAL | 0 refills | Status: DC | PRN

## 2016-09-01 NOTE — ED Provider Notes (Signed)
[facility] Emergency Department Provider Note    First MD Initiated Contact with Patient 09/01/16 0207     (approximate)  I have reviewed the triage vital signs and the nursing notes.   HISTORY  Chief Complaint Cough and Generalized Body Aches    HPI Douglas Singh is a 36 y.o. male presents with one-week history of productive cough nasal congestion and subjective fevers at home. Patient states that he took performed 1 hour before arrival to the emergency Department temperature on arrival was 100.1.    Past Medical History:  Diagnosis Date  . Anxiety   . Asthma   . Panic attacks   . Reflux     Patient Active Problem List   Diagnosis Date Noted  . Panic attack 12/03/2015  . Anxiety 06/16/2014  . Acid reflux 06/16/2014    Past Surgical History:  Procedure Laterality Date  . URETHRA SURGERY  2000?   stricture?    Prior to Admission medications   Medication Sig Start Date End Date Taking? Authorizing Provider  albuterol (PROVENTIL HFA;VENTOLIN HFA) 108 (90 Base) MCG/ACT inhaler Inhale into the lungs every 6 (six) hours as needed for wheezing or shortness of breath.    Historical Provider, MD  benzonatate (TESSALON PERLES) 100 MG capsule Take 1 capsule (100 mg total) by mouth every 6 (six) hours as needed for cough. 09/01/16 09/01/17  Darci Currentandolph N Josalin Carneiro, MD  brompheniramine-pseudoephedrine-DM 30-2-10 MG/5ML syrup Take 5 mLs by mouth 4 (four) times daily as needed. 08/23/16   Joni Reiningonald K Smith, PA-C  clonazePAM (KLONOPIN) 0.5 MG tablet Take 0.5 mg by mouth 3 (three) times daily as needed for anxiety.  11/29/14   Historical Provider, MD  ibuprofen (ADVIL,MOTRIN) 600 MG tablet Take 1 tablet (600 mg total) by mouth every 8 (eight) hours as needed. 08/23/16   Joni Reiningonald K Smith, PA-C  loratadine (CLARITIN) 10 MG tablet Take 1 tablet (10 mg total) by mouth daily. 03/02/16   Jami L Hagler, PA-C  sulfacetamide (BLEPH-10) 10 % ophthalmic solution Place 2 drops into both  eyes 4 (four) times daily. 03/14/16   Evangeline Dakinharles M Beers, PA-C    Allergies Codeine  Family History  Problem Relation Age of Onset  . Bladder Cancer Neg Hx   . Kidney cancer Neg Hx   . Prostate cancer Neg Hx     Social History Social History  Substance Use Topics  . Smoking status: Current Every Day Smoker    Packs/day: 0.50    Types: Cigarettes  . Smokeless tobacco: Never Used  . Alcohol use Yes     Comment: socially on weekends    Review of Systems Constitutional: Positive for fever/chills Eyes: No visual changes. ENT: No sore throat. Positive for nasal congestion Cardiovascular: Denies chest pain. Respiratory: Denies shortness of breath.Positive for cough Gastrointestinal: No abdominal pain.  No nausea, no vomiting.  No diarrhea.  No constipation. Genitourinary: Negative for dysuria. Musculoskeletal: Negative for back pain. Skin: Negative for rash. Neurological: Negative for headaches, focal weakness or numbness.  10-point ROS otherwise negative.  ____________________________________________   PHYSICAL EXAM:  VITAL SIGNS: ED Triage Vitals  Enc Vitals Group     BP 08/31/16 2240 (!) 143/77     Pulse Rate 08/31/16 2240 95     Resp 08/31/16 2240 18     Temp 08/31/16 2240 100.1 F (37.8 C)     Temp Source 08/31/16 2240 Oral     SpO2 08/31/16 2240 97 %     Weight 08/31/16  2241 230 lb (104.3 kg)     Height 08/31/16 2241 5\' 8"  (1.727 m)     Head Circumference --      Peak Flow --      Pain Score 08/31/16 2241 8     Pain Loc --      Pain Edu? --      Excl. in GC? --     Constitutional: Alert and oriented. Well appearing and in no acute distress. Eyes: Conjunctivae are normal. PERRL. EOMI. Head: Atraumatic. Ears:  Healthy appearing ear canals and TMs bilaterally Nose: No congestion/rhinnorhea. Mouth/Throat: Mucous membranes are moist.  Oropharynx non-erythematous. Neck: No stridor.   Cardiovascular: Normal rate, regular rhythm. Good peripheral circulation.  Grossly normal heart sounds. Respiratory: Normal respiratory effort.  No retractions. Lungs CTAB. Gastrointestinal: Soft and nontender. No distention.  Musculoskeletal: No lower extremity tenderness nor edema. No gross deformities of extremities. Neurologic:  Normal speech and language. No gross focal neurologic deficits are appreciated.  Skin:  Skin is warm, dry and intact. No rash noted. Psychiatric: Mood and affect are normal. Speech and behavior are normal.  ____________________________________________   LABS (all labs ordered are listed, but only abnormal results are displayed)  Labs Reviewed  RAPID INFLUENZA A&B ANTIGENS ([facility] ONLY)    RADIOLOGY I, [place] Tanicia Wolaver, personally viewed and evaluated these images (plain radiographs) as part of my medical decision making, as well as reviewing the written report by the radiologist.  Dg Chest 2 View  Result Date: 09/01/2016 CLINICAL DATA:  Cold and flu symptoms, nasal congestion. Febrile. History of asthma. EXAM: CHEST  2 VIEW COMPARISON:  Chest radiograph March 02, 2016 FINDINGS: Cardiomediastinal silhouette is normal. No pleural effusions or focal consolidations. Trachea projects midline and there is no pneumothorax. Soft tissue planes and included osseous structures are non-suspicious. IMPRESSION: Normal chest. Electronically Signed   By: Awilda Metro M.D.   On: 09/01/2016 00:40     Procedures      INITIAL IMPRESSION / ASSESSMENT AND PLAN / ED COURSE  Pertinent labs & imaging results that were available during my care of the patient were reviewed by me and considered in my medical decision making (see chart for details).  History physical exam consistent with possible viral URI patient prescribed Tessalon Perles for cough at home and advised to use DayQuil/NyQuil   Clinical Course     ____________________________________________  FINAL CLINICAL IMPRESSION(S) / ED DIAGNOSES  Final diagnoses:  Viral URI      MEDICATIONS GIVEN DURING THIS VISIT:  Medications  benzonatate (TESSALON) capsule 200 mg (200 mg Oral Given 09/01/16 0221)     NEW OUTPATIENT MEDICATIONS STARTED DURING THIS VISIT:  Discharge Medication List as of 09/01/2016  2:15 AM    START taking these medications   Details  benzonatate (TESSALON PERLES) 100 MG capsule Take 1 capsule (100 mg total) by mouth every 6 (six) hours as needed for cough., Starting Thu 09/01/2016, Until Fri 09/01/2017, Print        Discharge Medication List as of 09/01/2016  2:15 AM      Discharge Medication List as of 09/01/2016  2:15 AM       Note:  This document was prepared using Dragon voice recognition software and may include unintentional dictation errors.    [name], MD 09/01/16 [phone]

## 2016-11-09 ENCOUNTER — Emergency Department
Admission: EM | Admit: 2016-11-09 | Discharge: 2016-11-10 | Disposition: A | Discharge status: Home / Self Care | Payer: Self-pay | Attending: Emergency Medicine | Admitting: Emergency Medicine

## 2016-11-09 DIAGNOSIS — J45909 Unspecified asthma, uncomplicated: Secondary | ICD-10-CM | POA: Insufficient documentation

## 2016-11-09 DIAGNOSIS — K1120 Sialoadenitis, unspecified: Secondary | ICD-10-CM | POA: Insufficient documentation

## 2016-11-09 DIAGNOSIS — Z79899 Other long term (current) drug therapy: Secondary | ICD-10-CM | POA: Insufficient documentation

## 2016-11-09 DIAGNOSIS — F17210 Nicotine dependence, cigarettes, uncomplicated: Secondary | ICD-10-CM | POA: Insufficient documentation

## 2016-11-09 MED ORDER — NAPROXEN 500 MG PO TABS
500.0000 mg | ORAL_TABLET | Freq: Once | ORAL | Status: AC
  Administered 2016-11-09: 500 mg via ORAL
  Filled 2016-11-09: qty 1

## 2016-11-09 MED ORDER — NAPROXEN 500 MG PO TBEC: 500.0000 mg | DELAYED_RELEASE_TABLET | Freq: Two times a day (BID) | ORAL | 0 refills | Status: AC

## 2016-11-09 NOTE — ED Triage Notes (Signed)
Pt states sudden onset of right sided throat pain while eating a burger immediately prior to arrival. Pt appears in no acute distress.

## 2016-11-09 NOTE — ED Notes (Addendum)
See triage note, pt reports after eating a sandwich and getting home he developed right side sore throat, ear pain and facial pain. Pt states it hurts to swallow and states "it's painful, feels like something is there even when I swallow spit." Pt denies fever or nasal drainage. Pt A&O at this time, no difficulty noted to his speech or excess drooling.

## 2016-11-09 NOTE — ED Provider Notes (Signed)
[facility] Emergency Department Provider Note  ____________________________________________  Time seen: Approximately 11:24 PM  I have reviewed the triage vital signs and the nursing notes.   HISTORY  Chief Complaint Sore Throat    HPI Douglas Singh is a 36 y.o. male presenting to the emergency department with right-sided pharyngitis after eating a quarter pounder from [employer] this evening. Patient states that he has consumed hamburgers from [employer] several times in the past without incident. Patient denies choking or coughing while consuming his dinner. Patient denies cough, facial edema, throat tightness, peuritic ears, chest pain, chest tightness, shortness of breath, nausea, abdominal pain and hives. Patient has noticed dry mouth throughout the day. Patient has been afebrile. Patient denies rhinorrhea and congestion. No alleviating measures have been undertaken.   Past Medical History:  Diagnosis Date  . Anxiety   . Asthma   . Panic attacks   . Reflux     Patient Active Problem List   Diagnosis Date Noted  . Panic attack 12/03/2015  . Anxiety 06/16/2014  . Acid reflux 06/16/2014    Past Surgical History:  Procedure Laterality Date  . URETHRA SURGERY  2000?   stricture?    Prior to Admission medications   Medication Sig Start Date End Date Taking? Authorizing Provider  albuterol (PROVENTIL HFA;VENTOLIN HFA) 108 (90 Base) MCG/ACT inhaler Inhale into the lungs every 6 (six) hours as needed for wheezing or shortness of breath.    Historical Provider, MD  benzonatate (TESSALON PERLES) 100 MG capsule Take 1 capsule (100 mg total) by mouth every 6 (six) hours as needed for cough. 09/01/16 09/01/17  Darci Currentandolph N Brown, MD  brompheniramine-pseudoephedrine-DM 30-2-10 MG/5ML syrup Take 5 mLs by mouth 4 (four) times daily as needed. 08/23/16   Joni Reiningonald K Smith, PA-C  clonazePAM (KLONOPIN) 0.5 MG tablet Take 0.5 mg by mouth 3 (three) times daily as needed for  anxiety.  11/29/14   Historical Provider, MD  ibuprofen (ADVIL,MOTRIN) 600 MG tablet Take 1 tablet (600 mg total) by mouth every 8 (eight) hours as needed. 08/23/16   Joni Reiningonald K Smith, PA-C  loratadine (CLARITIN) 10 MG tablet Take 1 tablet (10 mg total) by mouth daily. 03/02/16   Jami L Hagler, PA-C  naproxen (EC NAPROSYN) 500 MG EC tablet Take 1 tablet (500 mg total) by mouth 2 (two) times daily with a meal. 11/09/16 11/19/16  Orvil FeilJaclyn M Deo Mehringer, PA-C  sulfacetamide (BLEPH-10) 10 % ophthalmic solution Place 2 drops into both eyes 4 (four) times daily. 03/14/16   Evangeline Dakinharles M Beers, PA-C    Allergies Codeine  Family History  Problem Relation Age of Onset  . Bladder Cancer Neg Hx   . Kidney cancer Neg Hx   . Prostate cancer Neg Hx     Social History Social History  Substance Use Topics  . Smoking status: Current Every Day Smoker    Packs/day: 0.50    Types: Cigarettes  . Smokeless tobacco: Never Used  . Alcohol use Yes     Comment: socially on weekends     Review of Systems  Constitutional: No fever/chills Eyes: No visual changes. No discharge ENT: Patient has right sided pharyngitis  Cardiovascular: no chest pain. Respiratory: no cough. No SOB. Gastrointestinal: No abdominal pain.  No nausea, no vomiting.  No diarrhea.  No constipation. Musculoskeletal: Negative for musculoskeletal pain. Skin: Negative for rash, abrasions, lacerations, ecchymosis. Neurological: Negative for headaches, focal weakness or numbness. __________________________________________   PHYSICAL EXAM:  VITAL SIGNS: ED Triage Vitals [11/09/16 2037]  Enc Vitals Group     BP (!) 151/86     Pulse Rate 66     Resp 16     Temp 99.1 F (37.3 C)     Temp Source Oral     SpO2 100 %     Weight 230 lb (104.3 kg)     Height 5\' 8"  (1.727 m)     Head Circumference      Peak Flow      Pain Score 10     Pain Loc      Pain Edu?      Excl. in GC?    Constitutional: Alert and oriented. Well appearing and in no acute  distress. Eyes: Conjunctivae are normal. PERRL. EOMI. Head: Atraumatic. ENT:      Ears: Tympanic membranes are pearly bilaterally without evidence of effusion, erythema or purulent exudate.      Nose: No congestion/rhinnorhea.      Mouth/Throat: No angioedema. Mucous membranes are moist. Uvula is midline. Airway is patent. No tonsillar hypertrophy or exudate. Patient has tenderness to palpation along the right submandibular gland. Neck: Full range of motion. Hematological/Lymphatic/Immunilogical: No cervical lymphadenopathy. Cardiovascular: Normal rate, regular rhythm. Normal S1 and S2.  Good peripheral circulation. Respiratory: Normal respiratory effort without tachypnea or retractions. Lungs CTAB. Good air entry to the bases with no decreased or absent breath sounds. Musculoskeletal: Full range of motion to all extremities. No gross deformities appreciated. Neurologic:  Normal speech and language. No gross focal neurologic deficits are appreciated.  Skin:  Skin is warm, dry and intact. No rash noted. Psychiatric: Mood and affect are normal. Speech and behavior are normal. Patient exhibits appropriate insight and judgement. ___________________________________________   LABS (all labs ordered are listed, but only abnormal results are displayed)  Labs Reviewed - No data to display ____________________________________________  EKG   ____________________________________________  RADIOLOGY   No results found.  ____________________________________________    PROCEDURES  Procedure(s) performed:    Procedures    Medications  naproxen (NAPROSYN) tablet 500 mg (500 mg Oral Given 11/09/16 2358)     ____________________________________________   INITIAL IMPRESSION / ASSESSMENT AND PLAN / ED COURSE  Pertinent labs & imaging results that were available during my care of the patient were reviewed by me and considered in my medical decision making (see chart for  details).  Review of the [place] CSRS was performed in accordance of the [facility] prior to dispensing any controlled drugs.     Assessment and plan: Sialadenitis  Patient presents to the emergency department with right sided pharyngitis. On physical exam, patient has tenderness elicited with palpation of the submandibular gland. Patient has had intermittent dry mouth throughout the day. History and physical exam findings are consistent with early sialoadenitis. Patient was prescribed naproxen for pharyngitis and advised to use sour candies to precipitate salivation. Strict return precautions were given. Patient was advised to follow-up with his primary care provider in one week. All patient questions were answered. ____________________________________________  FINAL CLINICAL IMPRESSION(S) / ED DIAGNOSES  Final diagnoses:  Sialadenitis      NEW MEDICATIONS STARTED DURING THIS VISIT:  Discharge Medication List as of 11/09/2016 11:49 PM    START taking these medications   Details  naproxen (EC NAPROSYN) 500 MG EC tablet Take 1 tablet (500 mg total) by mouth 2 (two) times daily with a meal., Starting Wed 11/09/2016, Until Sat 11/19/2016, Print            This chart was dictated using voice recognition  software/Dragon. Despite best efforts to proofread, errors can occur which can change the meaning. Any change was purely unintentional.    Orvil Feil, PA-C 11/10/16 0015    Phineas Semen, MD 11/10/16 [phone]
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
## 2017-03-05 ENCOUNTER — Encounter: Payer: Self-pay | Admitting: Emergency Medicine

## 2017-03-05 ENCOUNTER — Emergency Department
Admission: EM | Admit: 2017-03-05 | Discharge: 2017-03-05 | Disposition: A | Discharge status: Home / Self Care | Payer: Self-pay | Attending: Emergency Medicine | Admitting: Emergency Medicine

## 2017-03-05 DIAGNOSIS — J45909 Unspecified asthma, uncomplicated: Secondary | ICD-10-CM | POA: Insufficient documentation

## 2017-03-05 DIAGNOSIS — J069 Acute upper respiratory infection, unspecified: Secondary | ICD-10-CM | POA: Insufficient documentation

## 2017-03-05 DIAGNOSIS — H6983 Other specified disorders of Eustachian tube, bilateral: Secondary | ICD-10-CM | POA: Insufficient documentation

## 2017-03-05 DIAGNOSIS — F17210 Nicotine dependence, cigarettes, uncomplicated: Secondary | ICD-10-CM | POA: Insufficient documentation

## 2017-03-05 DIAGNOSIS — Z7951 Long term (current) use of inhaled steroids: Secondary | ICD-10-CM | POA: Insufficient documentation

## 2017-03-05 MED ORDER — LIDOCAINE VISCOUS 2 % MT SOLN: 10.0000 mL | OROMUCOSAL | 0 refills | Status: DC | PRN

## 2017-03-05 MED ORDER — CETIRIZINE HCL 10 MG PO TABS: 10.0000 mg | ORAL_TABLET | Freq: Every day | ORAL | 0 refills | Status: DC

## 2017-03-05 MED ORDER — FLUTICASONE PROPIONATE 50 MCG/ACT NA SUSP: 2.0000 | Freq: Every day | NASAL | 0 refills | Status: DC

## 2017-03-05 MED ORDER — BENZONATATE 100 MG PO CAPS: 100.0000 mg | ORAL_CAPSULE | Freq: Three times a day (TID) | ORAL | 0 refills | Status: DC | PRN

## 2017-03-05 NOTE — ED Provider Notes (Signed)
[facility] Emergency Department Provider Note  ____________________________________________  Time seen: Approximately 7:35 AM  I have reviewed the triage vital signs and the nursing notes.   HISTORY  Chief Complaint Otalgia    HPI Douglas Singh is a 36 y.o. male that presents to the emergency department with 2 days of a sore throat, nasal congestion, bilateral ear pain, and nonproductive cough. Patient smokes 2-3 cigarettes per day. No alleviating measures have been attempted. No sick contacts. He denies fever, shortness of breath, chest pain, nausea, vomiting, abdominal pain.   Past Medical History:  Diagnosis Date  . Anxiety   . Asthma   . Panic attacks   . Reflux     Patient Active Problem List   Diagnosis Date Noted  . Panic attack 12/03/2015  . Anxiety 06/16/2014  . Acid reflux 06/16/2014    Past Surgical History:  Procedure Laterality Date  . URETHRA SURGERY  2000?   stricture?    Prior to Admission medications   Medication Sig Start Date End Date Taking? Authorizing Provider  albuterol (PROVENTIL HFA;VENTOLIN HFA) 108 (90 Base) MCG/ACT inhaler Inhale into the lungs every 6 (six) hours as needed for wheezing or shortness of breath.    [provider]  benzonatate (TESSALON PERLES) 100 MG capsule Take 1 capsule (100 mg total) by mouth 3 (three) times daily as needed for cough. 03/05/17 03/05/18  Enid Derry, PA-C  brompheniramine-pseudoephedrine-DM 30-2-10 MG/5ML syrup Take 5 mLs by mouth 4 (four) times daily as needed. 08/23/16   Joni Reining, PA-C  cetirizine (ZYRTEC ALLERGY) 10 MG tablet Take 1 tablet (10 mg total) by mouth daily. 03/05/17   Enid Derry, PA-C  clonazePAM (KLONOPIN) 0.5 MG tablet Take 0.5 mg by mouth 3 (three) times daily as needed for anxiety.  11/29/14   [provider]  fluticasone (FLONASE) 50 MCG/ACT nasal spray Place 2 sprays into both nostrils daily. 03/05/17 03/05/18  Enid Derry, PA-C  ibuprofen  (ADVIL,MOTRIN) 600 MG tablet Take 1 tablet (600 mg total) by mouth every 8 (eight) hours as needed. 08/23/16   Joni Reining, PA-C  lidocaine (XYLOCAINE) 2 % solution Use as directed 10 mLs in the mouth or throat as needed for mouth pain. 03/05/17   Enid Derry, PA-C  loratadine (CLARITIN) 10 MG tablet Take 1 tablet (10 mg total) by mouth daily. 03/02/16   Hagler, Jami L, PA-C  sulfacetamide (BLEPH-10) 10 % ophthalmic solution Place 2 drops into both eyes 4 (four) times daily. 03/14/16   Beers, Charmayne Sheer, PA-C    Allergies Codeine  Family History  Problem Relation Age of Onset  . Bladder Cancer Neg Hx   . Kidney cancer Neg Hx   . Prostate cancer Neg Hx     Social History Social History  Substance Use Topics  . Smoking status: Current Every Day Smoker    Packs/day: 0.50    Types: Cigarettes  . Smokeless tobacco: Never Used  . Alcohol use Yes     Comment: socially on weekends     Review of Systems  Constitutional: No fever/chills Eyes: No visual changes. No discharge. ENT: Positive for congestion and rhinorrhea. Cardiovascular: No chest pain. Respiratory: Positive for cough. No SOB. Gastrointestinal: No abdominal pain.  No nausea, no vomiting.  No diarrhea.  No constipation. Skin: Negative for rash, abrasions, lacerations, ecchymosis. Neurological: Negative for headaches.   ____________________________________________   PHYSICAL EXAM:  VITAL SIGNS: ED Triage Vitals  Enc Vitals Group     BP 03/05/17  1610 125/86     Pulse Rate 03/05/17 0642 74     Resp 03/05/17 0642 18     Temp 03/05/17 0642 98.2 F (36.8 C)     Temp Source 03/05/17 0642 Oral     SpO2 03/05/17 0642 100 %     Weight 03/05/17 0641 230 lb (104.3 kg)     Height 03/05/17 0641 5\' 8"  (1.727 m)     Head Circumference --      Peak Flow --      Pain Score 03/05/17 0641 6     Pain Loc --      Pain Edu? --      Excl. in GC? --      Constitutional: Alert and oriented. Well appearing and in no acute  distress. Eyes: Conjunctivae are normal. PERRL. EOMI. No discharge. Head: Atraumatic. ENT: No frontal and maxillary sinus tenderness.      Ears: Tympanic membranes pearly gray with good landmarks. No discharge.      Nose: Mild congestion/rhinnorhea.      Mouth/Throat: Mucous membranes are moist. Oropharynx non-erythematous. Tonsils not enlarged. No exudates. Uvula midline. Neck: No stridor.   Hematological/Lymphatic/Immunilogical: No cervical lymphadenopathy. Cardiovascular: Normal rate, regular rhythm.  Good peripheral circulation. Respiratory: Normal respiratory effort without tachypnea or retractions. Lungs CTAB. Good air entry to the bases with no decreased or absent breath sounds. Gastrointestinal: Bowel sounds 4 quadrants. Soft and nontender to palpation. No guarding or rigidity. No palpable masses. No distention. Musculoskeletal: Full range of motion to all extremities. No gross deformities appreciated. Neurologic:  Normal speech and language. No gross focal neurologic deficits are appreciated.  Skin:  Skin is warm, dry and intact. No rash noted. Psychiatric: Mood and affect are normal. Speech and behavior are normal. Patient exhibits appropriate insight and judgement.   ____________________________________________   LABS (all labs ordered are listed, but only abnormal results are displayed)  Labs Reviewed - No data to display ____________________________________________  EKG   ____________________________________________  RADIOLOGY   No results found.  ____________________________________________    PROCEDURES  Procedure(s) performed:    Procedures    Medications - No data to display   ____________________________________________   INITIAL IMPRESSION / ASSESSMENT AND PLAN / ED COURSE  Pertinent labs & imaging results that were available during my care of the patient were reviewed by me and considered in my medical decision making (see chart for  details).  Review of the [place] CSRS was performed in accordance of the [facility] prior to dispensing any controlled drugs.     Patient's diagnosis is consistent with viral upper respiratory infection. Vital signs and exam are reassuring. Patient appears well and is staying well hydrated. Education about antibiotics was provided. Patient feels comfortable going home. Patient will be discharged home with prescriptions for viscous lidocaine, Zyrtec, Tessalon Perles, Flonase. Patient is to follow up with PCP as needed or otherwise directed. Patient is given ED precautions to return to the ED for any worsening or new symptoms.     ____________________________________________  FINAL CLINICAL IMPRESSION(S) / ED DIAGNOSES  Final diagnoses:  Viral upper respiratory tract infection  Dysfunction of both eustachian tubes      NEW MEDICATIONS STARTED DURING THIS VISIT:  Discharge Medication List as of 03/05/2017  7:38 AM    START taking these medications   Details  cetirizine (ZYRTEC ALLERGY) 10 MG tablet Take 1 tablet (10 mg total) by mouth daily., Starting Sun 03/05/2017, Print    fluticasone (FLONASE) 50 MCG/ACT nasal spray Place  2 sprays into both nostrils daily., Starting Sun 03/05/2017, Until Mon 03/05/2018, Print    lidocaine (XYLOCAINE) 2 % solution Use as directed 10 mLs in the mouth or throat as needed for mouth pain., Starting Sun 03/05/2017, Print            This chart was dictated using voice recognition software/Dragon. Despite best efforts to proofread, errors can occur which can change the meaning. Any change was purely unintentional.    Enid DerryWagner, Crist Kruszka, PA-C 03/05/17 [id number]    Phineas SemenGoodman, [place], MD 03/05/17 [phone]

## 2017-03-05 NOTE — ED Triage Notes (Signed)
Pt arrives ambulatory to triage with c/o bilateral ear pain which makes his throat hurt when he swallows. Pt is in NAD at this time.

## 2017-03-05 NOTE — ED Notes (Signed)
Pt called for triage x1 without response.

## 2017-03-11 ENCOUNTER — Emergency Department: Payer: Self-pay

## 2017-03-11 ENCOUNTER — Emergency Department
Admission: EM | Admit: 2017-03-11 | Discharge: 2017-03-12 | Disposition: A | Discharge status: Home / Self Care | Payer: Self-pay | Attending: Emergency Medicine | Admitting: Emergency Medicine

## 2017-03-11 DIAGNOSIS — J45909 Unspecified asthma, uncomplicated: Secondary | ICD-10-CM | POA: Insufficient documentation

## 2017-03-11 DIAGNOSIS — F17210 Nicotine dependence, cigarettes, uncomplicated: Secondary | ICD-10-CM | POA: Insufficient documentation

## 2017-03-11 DIAGNOSIS — Z79899 Other long term (current) drug therapy: Secondary | ICD-10-CM | POA: Insufficient documentation

## 2017-03-11 DIAGNOSIS — J069 Acute upper respiratory infection, unspecified: Secondary | ICD-10-CM

## 2017-03-11 MED ORDER — IBUPROFEN 800 MG PO TABS
800.0000 mg | ORAL_TABLET | Freq: Once | ORAL | Status: AC
  Administered 2017-03-11: 800 mg via ORAL
  Filled 2017-03-11: qty 1

## 2017-03-11 NOTE — ED Triage Notes (Signed)
Pt states was seen here a week ago for URI symptoms. Pt states symptoms have persisted. Pt states he has been using his inhaler, has a cough, "with phelgm", headache, sore throat. Pt appears in no acute distress.

## 2017-03-12 MED ORDER — LEVOFLOXACIN 500 MG PO TABS: 750.0000 mg | ORAL_TABLET | Freq: Every day | ORAL | 0 refills | Status: AC

## 2017-03-12 MED ORDER — LEVOFLOXACIN 750 MG PO TABS
750.0000 mg | ORAL_TABLET | Freq: Once | ORAL | Status: AC
  Administered 2017-03-12: 750 mg via ORAL
  Filled 2017-03-12: qty 1

## 2017-03-12 MED ORDER — PREDNISONE 10 MG PO TABS: ORAL_TABLET | ORAL | 0 refills | Status: DC

## 2017-03-12 MED ORDER — PREDNISONE 20 MG PO TABS
60.0000 mg | ORAL_TABLET | Freq: Once | ORAL | Status: AC
  Administered 2017-03-12: 60 mg via ORAL
  Filled 2017-03-12: qty 3

## 2017-03-12 NOTE — ED Provider Notes (Signed)
[facility] Emergency Department Provider Note  ____________________________________________  Time seen: Approximately 12:15 AM  I have reviewed the triage vital signs and the nursing notes.   HISTORY  Chief Complaint Cough and Nasal Congestion    HPI Douglas Singh is a 36 y.o. male that presents to the emergency department with headache, nasal congestion, sore throat, productive cough with clear phlegm for 7 days. Headache begins in the front of his head and wraps around the sides. He feels it is because he is so congested. He is eating and drinking well. He smokes a half a pack of cigarettes per day. Patient was seen in the emergency department one week ago for symptoms and was told that he has a viral upper respiratory infection. He hast taken medication for symptoms without relief. He states that he is primarily concerned for pneumonia. He states that his uncle was seen in this department a couple months ago and was told that he also had a viral respiratory infection but then went to [facility] and found that he had pneumonia. Patient is very upset by this. He denies fever, shortness of breath, chest pain, nausea, vomiting, abdominal pain.   Past Medical History:  Diagnosis Date  . Anxiety   . Asthma   . Panic attacks   . Reflux     Patient Active Problem List   Diagnosis Date Noted  . Panic attack 12/03/2015  . Anxiety 06/16/2014  . Acid reflux 06/16/2014    Past Surgical History:  Procedure Laterality Date  . URETHRA SURGERY  2000?   stricture?    Prior to Admission medications   Medication Sig Start Date End Date Taking? Authorizing Provider  albuterol (PROVENTIL HFA;VENTOLIN HFA) 108 (90 Base) MCG/ACT inhaler Inhale into the lungs every 6 (six) hours as needed for wheezing or shortness of breath.    [provider]  benzonatate (TESSALON PERLES) 100 MG capsule Take 1 capsule (100 mg total) by mouth 3 (three) times daily as needed for  cough. 03/05/17 03/05/18  Enid Derry, PA-C  brompheniramine-pseudoephedrine-DM 30-2-10 MG/5ML syrup Take 5 mLs by mouth 4 (four) times daily as needed. 08/23/16   Joni Reining, PA-C  cetirizine (ZYRTEC ALLERGY) 10 MG tablet Take 1 tablet (10 mg total) by mouth daily. 03/05/17   Enid Derry, PA-C  clonazePAM (KLONOPIN) 0.5 MG tablet Take 0.5 mg by mouth 3 (three) times daily as needed for anxiety.  11/29/14   [provider]  fluticasone (FLONASE) 50 MCG/ACT nasal spray Place 2 sprays into both nostrils daily. 03/05/17 03/05/18  Enid Derry, PA-C  ibuprofen (ADVIL,MOTRIN) 600 MG tablet Take 1 tablet (600 mg total) by mouth every 8 (eight) hours as needed. 08/23/16   Joni Reining, PA-C  levofloxacin (LEVAQUIN) 500 MG tablet Take 1.5 tablets (750 mg total) by mouth daily. 03/12/17 03/19/17  Enid Derry, PA-C  lidocaine (XYLOCAINE) 2 % solution Use as directed 10 mLs in the mouth or throat as needed for mouth pain. 03/05/17   Enid Derry, PA-C  loratadine (CLARITIN) 10 MG tablet Take 1 tablet (10 mg total) by mouth daily. 03/02/16   Hagler, Jami L, PA-C  predniSONE (DELTASONE) 10 MG tablet Take 6 tablets on day 1, take 5 tablets on day 2, take 4 tablets on day 3, take 3 tablets on day 4, take 2 tablets on day 5, take 1 tablet on day 6 03/12/17   Enid Derry, PA-C  sulfacetamide (BLEPH-10) 10 % ophthalmic solution Place 2 drops into both  eyes 4 (four) times daily. 03/14/16   Beers, Charmayne Sheer, PA-C    Allergies Codeine  Family History  Problem Relation Age of Onset  . Bladder Cancer Neg Hx   . Kidney cancer Neg Hx   . Prostate cancer Neg Hx     Social History Social History  Substance Use Topics  . Smoking status: Current Every Day Smoker    Packs/day: 0.50    Types: Cigarettes  . Smokeless tobacco: Never Used  . Alcohol use Yes     Comment: socially on weekends     Review of Systems  Constitutional: No fever/chills Eyes: No visual changes. No discharge. ENT: Positive  for congestion and rhinorrhea. Cardiovascular: No chest pain. Respiratory: Positive for cough. No SOB. Gastrointestinal: No abdominal pain.  No nausea, no vomiting.  No diarrhea.  No constipation. Musculoskeletal: Negative for musculoskeletal pain. Skin: Negative for rash, abrasions, lacerations, ecchymosis. Neurological: Positive for headache.   ____________________________________________   PHYSICAL EXAM:  VITAL SIGNS: ED Triage Vitals  Enc Vitals Group     BP 03/11/17 2028 (!) 111/97     Pulse Rate 03/11/17 2028 78     Resp 03/11/17 2028 16     Temp 03/11/17 2028 99.1 F (37.3 C)     Temp Source 03/11/17 2028 Oral     SpO2 03/11/17 2028 100 %     Weight 03/11/17 2029 225 lb (102.1 kg)     Height 03/11/17 2029 5\' 8"  (1.727 m)     Head Circumference --      Peak Flow --      Pain Score 03/11/17 2028 8     Pain Loc --      Pain Edu? --      Excl. in GC? --      Constitutional: Alert and oriented. Well appearing and in no acute distress. Eyes: Conjunctivae are normal. PERRL. EOMI. No discharge. Head: Atraumatic. ENT: No frontal and maxillary sinus tenderness.      Ears: Tympanic membranes pearly gray with good landmarks. No discharge.      Nose: Mild congestion/rhinnorhea.      Mouth/Throat: Mucous membranes are moist. Oropharynx non-erythematous. Tonsils not enlarged. No exudates. Uvula midline. Neck: No stridor.   Hematological/Lymphatic/Immunilogical: No cervical lymphadenopathy. Cardiovascular: Normal rate, regular rhythm.  Good peripheral circulation. Respiratory: Normal respiratory effort without tachypnea or retractions. Lungs CTAB. Good air entry to the bases with no decreased or absent breath sounds. Gastrointestinal: Bowel sounds 4 quadrants. Soft and nontender to palpation. No guarding or rigidity. No palpable masses. No distention. Musculoskeletal: Full range of motion to all extremities. No gross deformities appreciated. Neurologic:  Normal speech and  language. No gross focal neurologic deficits are appreciated.  Skin:  Skin is warm, dry and intact. No rash noted.   ____________________________________________   LABS (all labs ordered are listed, but only abnormal results are displayed)  Labs Reviewed - No data to display ____________________________________________  EKG   ____________________________________________  RADIOLOGY Lexine Baton, personally viewed and evaluated these images (plain radiographs) as part of my medical decision making, as well as reviewing the written report by the radiologist.  Dg Chest 2 View  Result Date: 03/11/2017 CLINICAL DATA:  Congestion and cough EXAM: CHEST  2 VIEW COMPARISON:  09/01/2016 FINDINGS: No focal consolidation or pleural effusion. Mildly coarse perihilar interstitial opacity. Normal heart size. No pneumothorax. IMPRESSION: Mildly coarse perihilar interstitial opacity suggesting bronchial inflammation. No focal infiltrate is seen. Electronically Signed   By: Adrian Prows.D.  On: 03/11/2017 23:55    ____________________________________________    PROCEDURES  Procedure(s) performed:    Procedures    Medications  ibuprofen (ADVIL,MOTRIN) tablet 800 mg (800 mg Oral Given 03/11/17 2358)  levofloxacin (LEVAQUIN) tablet 750 mg (750 mg Oral Given 03/12/17 0014)  predniSONE (DELTASONE) tablet 60 mg (60 mg Oral Given 03/12/17 0014)     ____________________________________________   INITIAL IMPRESSION / ASSESSMENT AND PLAN / ED COURSE  Pertinent labs & imaging results that were available during my care of the patient were reviewed by me and considered in my medical decision making (see chart for details).  Review of the [place] CSRS was performed in accordance of the [facility] prior to dispensing any controlled drugs.     Patient's diagnosis is consistent with upper respiratory infection. Vital signs and exam are reassuring. Chest x-ray negative for acute cardiopulmonary  processes. Headache improved with ibuprofen. Patient sounds primarily congested but he is most concerned for pneumonia. Symptoms have been going on for greater than a week so I will cover for bacterial causes. Patient should alternate tylenol and ibuprofen for fever. Patient feels comfortable going home. Patient will be discharged home with prescriptions for Levaquin and prednisone. Patient is to follow up with PCP as needed or otherwise directed. Patient is given ED precautions to return to the ED for any worsening or new symptoms.     ____________________________________________  FINAL CLINICAL IMPRESSION(S) / ED DIAGNOSES  Final diagnoses:  Upper respiratory tract infection, unspecified type      NEW MEDICATIONS STARTED DURING THIS VISIT:  Discharge Medication List as of 03/12/2017 12:08 AM    START taking these medications   Details  levofloxacin (LEVAQUIN) 500 MG tablet Take 1.5 tablets (750 mg total) by mouth daily., Starting Sun 03/12/2017, Until Sun 03/19/2017, Print    predniSONE (DELTASONE) 10 MG tablet Take 6 tablets on day 1, take 5 tablets on day 2, take 4 tablets on day 3, take 3 tablets on day 4, take 2 tablets on day 5, take 1 tablet on day 6, Print            This chart was dictated using voice recognition software/Dragon. Despite best efforts to proofread, errors can occur which can change the meaning. Any change was purely unintentional.    Enid DerryWagner, Ayvin Lipinski, PA-C 03/12/17 1536    Phineas SemenGoodman, Graydon, MD 03/12/17 [phone]

## 2017-04-07 ENCOUNTER — Emergency Department
Admission: EM | Admit: 2017-04-07 | Discharge: 2017-04-07 | Disposition: A | Discharge status: Home / Self Care | Payer: Self-pay | Attending: Emergency Medicine | Admitting: Emergency Medicine

## 2017-04-07 ENCOUNTER — Encounter: Payer: Self-pay | Admitting: Emergency Medicine

## 2017-04-07 DIAGNOSIS — F17210 Nicotine dependence, cigarettes, uncomplicated: Secondary | ICD-10-CM | POA: Insufficient documentation

## 2017-04-07 DIAGNOSIS — Z79899 Other long term (current) drug therapy: Secondary | ICD-10-CM | POA: Insufficient documentation

## 2017-04-07 DIAGNOSIS — J45909 Unspecified asthma, uncomplicated: Secondary | ICD-10-CM | POA: Insufficient documentation

## 2017-04-07 DIAGNOSIS — Y929 Unspecified place or not applicable: Secondary | ICD-10-CM | POA: Insufficient documentation

## 2017-04-07 DIAGNOSIS — Y9384 Activity, sleeping: Secondary | ICD-10-CM | POA: Insufficient documentation

## 2017-04-07 DIAGNOSIS — Y999 Unspecified external cause status: Secondary | ICD-10-CM | POA: Insufficient documentation

## 2017-04-07 DIAGNOSIS — S0502XA Injury of conjunctiva and corneal abrasion without foreign body, left eye, initial encounter: Secondary | ICD-10-CM | POA: Insufficient documentation

## 2017-04-07 DIAGNOSIS — X58XXXA Exposure to other specified factors, initial encounter: Secondary | ICD-10-CM | POA: Insufficient documentation

## 2017-04-07 MED ORDER — KETOROLAC TROMETHAMINE 0.5 % OP SOLN: 1.0000 [drp] | Freq: Four times a day (QID) | OPHTHALMIC | 0 refills | Status: DC

## 2017-04-07 MED ORDER — TETRACAINE HCL 0.5 % OP SOLN
2.0000 [drp] | Freq: Once | OPHTHALMIC | Status: DC
  Filled 2017-04-07: qty 4

## 2017-04-07 MED ORDER — FLUORESCEIN SODIUM 0.6 MG OP STRP
1.0000 | ORAL_STRIP | Freq: Once | OPHTHALMIC | Status: DC
  Filled 2017-04-07: qty 1

## 2017-04-07 MED ORDER — POLYMYXIN B-TRIMETHOPRIM 10000-0.1 UNIT/ML-% OP SOLN: 2.0000 [drp] | Freq: Four times a day (QID) | OPHTHALMIC | 0 refills | Status: DC

## 2017-04-07 NOTE — ED Provider Notes (Signed)
[facility] Emergency Department Provider Note  ____________________________________________  Time seen: Approximately 6:15 PM  I have reviewed the triage vital signs and the nursing notes.   HISTORY  Chief Complaint Eye Problem    HPI Douglas Singh is a 36 y.o. male who presents to emergency room complaining of foreign body sensation to the left eye. Patient reports that he woke up with the sensation. He reports that he slipped on the couch after his child had eaten a bag of Doritos. Patient is concerned that he may have gotten chip particles into his eye. Patient wears glasses but does not wear contacts. He denies any visual changes. Patient has tried eyewash in eye irritation eyedrops over-the-counter. Patient reports that this did not improve symptoms.   Past Medical History:  Diagnosis Date  . Anxiety   . Asthma   . Panic attacks   . Reflux     Patient Active Problem List   Diagnosis Date Noted  . Panic attack 12/03/2015  . Anxiety 06/16/2014  . Acid reflux 06/16/2014    Past Surgical History:  Procedure Laterality Date  . URETHRA SURGERY  2000?   stricture?    Prior to Admission medications   Medication Sig Start Date End Date Taking? Authorizing Provider  albuterol (PROVENTIL HFA;VENTOLIN HFA) 108 (90 Base) MCG/ACT inhaler Inhale into the lungs every 6 (six) hours as needed for wheezing or shortness of breath.    [provider]  benzonatate (TESSALON PERLES) 100 MG capsule Take 1 capsule (100 mg total) by mouth 3 (three) times daily as needed for cough. 03/05/17 03/05/18  Enid DerryWagner, Ashley, PA-C  brompheniramine-pseudoephedrine-DM 30-2-10 MG/5ML syrup Take 5 mLs by mouth 4 (four) times daily as needed. 08/23/16   Joni ReiningSmith, Ronald K, PA-C  cetirizine (ZYRTEC ALLERGY) 10 MG tablet Take 1 tablet (10 mg total) by mouth daily. 03/05/17   Enid DerryWagner, Ashley, PA-C  clonazePAM (KLONOPIN) 0.5 MG tablet Take 0.5 mg by mouth 3 (three) times daily as needed  for anxiety.  11/29/14   [provider]  fluticasone (FLONASE) 50 MCG/ACT nasal spray Place 2 sprays into both nostrils daily. 03/05/17 03/05/18  Enid DerryWagner, Ashley, PA-C  ibuprofen (ADVIL,MOTRIN) 600 MG tablet Take 1 tablet (600 mg total) by mouth every 8 (eight) hours as needed. 08/23/16   Joni ReiningSmith, Ronald K, PA-C  ketorolac (ACULAR) 0.5 % ophthalmic solution Place 1 drop into the left eye 4 (four) times daily. 04/07/17   Collyns Mcquigg, Delorise RoyalsJonathan D, PA-C  lidocaine (XYLOCAINE) 2 % solution Use as directed 10 mLs in the mouth or throat as needed for mouth pain. 03/05/17   Enid DerryWagner, Ashley, PA-C  loratadine (CLARITIN) 10 MG tablet Take 1 tablet (10 mg total) by mouth daily. 03/02/16   Hagler, Jami L, PA-C  predniSONE (DELTASONE) 10 MG tablet Take 6 tablets on day 1, take 5 tablets on day 2, take 4 tablets on day 3, take 3 tablets on day 4, take 2 tablets on day 5, take 1 tablet on day 6 03/12/17   Enid DerryWagner, Ashley, PA-C  sulfacetamide (BLEPH-10) 10 % ophthalmic solution Place 2 drops into both eyes 4 (four) times daily. 03/14/16   Beers, Charmayne Sheerharles M, PA-C  trimethoprim-polymyxin b (POLYTRIM) ophthalmic solution Place 2 drops into the left eye every 6 (six) hours. 04/07/17   Brenen Beigel, Delorise RoyalsJonathan D, PA-C    Allergies Codeine  Family History  Problem Relation Age of Onset  . Bladder Cancer Neg Hx   . Kidney cancer Neg Hx   . Prostate cancer  Neg Hx     Social History Social History  Substance Use Topics  . Smoking status: Current Every Day Smoker    Packs/day: 0.50    Types: Cigarettes  . Smokeless tobacco: Never Used  . Alcohol use Yes     Comment: socially on weekends     Review of Systems  Constitutional: No fever/chills Eyes: No visual changes. No discharge. Positive for foreign body sensation to the left eye. ENT: No upper respiratory complaints. Cardiovascular: no chest pain. Respiratory: no cough. No SOB. Gastrointestinal: No abdominal pain.  No nausea, no vomiting.   Musculoskeletal: Negative  for musculoskeletal pain. Skin: Negative for rash, abrasions, lacerations, ecchymosis. Neurological: Negative for headaches, focal weakness or numbness. 10-point ROS otherwise negative.  ____________________________________________   PHYSICAL EXAM:  VITAL SIGNS: ED Triage Vitals [04/07/17 1803]  Enc Vitals Group     BP (!) 144/93     Pulse Rate 89     Resp 16     Temp 97.8 F (36.6 C)     Temp Source Oral     SpO2 100 %     Weight 225 lb (102.1 kg)     Height 5\' 8"  (1.727 m)     Head Circumference      Peak Flow      Pain Score 6     Pain Loc      Pain Edu?      Excl. in GC?      Constitutional: Alert and oriented. Well appearing and in no acute distress. Eyes: Conjunctivae are normal. PERRL. EOMI.No visible foreign body on funduscopic exam. Good red reflex, vasculature, optic disc. Eyes anesthetized using tetracaine drops and fluorescein strip was applied. 2 areas of uptake consistent with corneal abrasions are noted. Head: Atraumatic. ENT:      Ears:       Nose: No congestion/rhinnorhea.      Mouth/Throat: Mucous membranes are moist.  Neck: No stridor.    Cardiovascular: Normal rate, regular rhythm. Normal S1 and S2.  Good peripheral circulation. Respiratory: Normal respiratory effort without tachypnea or retractions. Lungs CTAB. Good air entry to the bases with no decreased or absent breath sounds. Musculoskeletal: Full range of motion to all extremities. No gross deformities appreciated. Neurologic:  Normal speech and language. No gross focal neurologic deficits are appreciated.  Skin:  Skin is warm, dry and intact. No rash noted. Psychiatric: Mood and affect are normal. Speech and behavior are normal. Patient exhibits appropriate insight and judgement.   ____________________________________________   LABS (all labs ordered are listed, but only abnormal results are displayed)  Labs Reviewed - No data to  display ____________________________________________  EKG   ____________________________________________  RADIOLOGY   No results found.  ____________________________________________    PROCEDURES  Procedure(s) performed:    Procedures    Medications  fluorescein ophthalmic strip 1 strip (not administered)  tetracaine (PONTOCAINE) 0.5 % ophthalmic solution 2 drop (not administered)     ____________________________________________   INITIAL IMPRESSION / ASSESSMENT AND PLAN / ED COURSE  Pertinent labs & imaging results that were available during my care of the patient were reviewed by me and considered in my medical decision making (see chart for details).  Review of the [place] CSRS was performed in accordance of the [facility] prior to dispensing any controlled drugs.     Patient's diagnosis is consistent with corneal abrasion to the left eye. No visible foreign body. Exam was otherwise reassuring.. Patient will be discharged home with prescriptions for antibiotic eyedrops  and Acular. Patient is to follow up with ophthalmology as needed or otherwise directed. Patient is given ED precautions to return to the ED for any worsening or new symptoms.     ____________________________________________  FINAL CLINICAL IMPRESSION(S) / ED DIAGNOSES  Final diagnoses:  Abrasion of left cornea, initial encounter      NEW MEDICATIONS STARTED DURING THIS VISIT:  Discharge Medication List as of 04/07/2017  6:51 PM    START taking these medications   Details  ketorolac (ACULAR) 0.5 % ophthalmic solution Place 1 drop into the left eye 4 (four) times daily., Starting Fri 04/07/2017, Print    trimethoprim-polymyxin b (POLYTRIM) ophthalmic solution Place 2 drops into the left eye every 6 (six) hours., Starting Fri 04/07/2017, Print            This chart was dictated using voice recognition software/Dragon. Despite best efforts to proofread, errors can occur which can change the  meaning. Any change was purely unintentional.    Racheal Patches, PA-C 04/07/17 1910    Myrna Blazer, MD 04/07/17 2351

## 2017-04-07 NOTE — ED Triage Notes (Signed)
Woke up this am with scratching/ burning to left eye.  Has rinsed eye out and used eye drops today, no relief.

## 2017-04-17 ENCOUNTER — Emergency Department: Payer: Self-pay

## 2017-04-17 ENCOUNTER — Emergency Department
Admission: EM | Admit: 2017-04-17 | Discharge: 2017-04-17 | Disposition: A | Discharge status: Home / Self Care | Payer: Self-pay | Attending: Emergency Medicine | Admitting: Emergency Medicine

## 2017-04-17 DIAGNOSIS — Z79899 Other long term (current) drug therapy: Secondary | ICD-10-CM | POA: Insufficient documentation

## 2017-04-17 DIAGNOSIS — J45909 Unspecified asthma, uncomplicated: Secondary | ICD-10-CM | POA: Insufficient documentation

## 2017-04-17 DIAGNOSIS — F17210 Nicotine dependence, cigarettes, uncomplicated: Secondary | ICD-10-CM | POA: Insufficient documentation

## 2017-04-17 DIAGNOSIS — F410 Panic disorder [episodic paroxysmal anxiety] without agoraphobia: Secondary | ICD-10-CM | POA: Insufficient documentation

## 2017-04-17 NOTE — ED Triage Notes (Addendum)
Patient reports having shortness of breath and tightness to right chest. Patient speaking in complete sentences.   Concerned might be a panic attack.  Patient encouraged to relax and take slow deep breaths.

## 2017-04-17 NOTE — ED Provider Notes (Signed)
[facility] Emergency Department Provider Note   ____________________________________________   First MD Initiated Contact with Patient 04/17/17 [phone]     (approximate)  I have reviewed the triage vital signs and the nursing notes.   HISTORY  Chief Complaint Shortness of Breath    HPI Douglas Singh is a 36 y.o. male who comes into the hospital today with some chest tightness. He reports that he was watching TV and he felt some chest tightness earlier this evening. He thought maybe his asthma was acting up so he uses inhaler 2-3 times. He reports that these time he used his inhaler he felt more panicky. He reports that he decided to come in and get checked out. He states while he was here the symptoms started to subside. He states that the pain was in the right side of his chest and seemed to go around the right side of his back but it has all resolved at this time. The patient denies any symptoms and reports that the panicky feeling is all gone. The patient states that it does feel the way it has with panic in the past. The patient denies any sweats nausea or vomiting. He had some mild shortness of breath but no cough or runny nose. The patient is he just overreacted and feels much better. The patient states that he does have Klonopin for home but he did not take any this evening. He is here for evaluation.   Past Medical History:  Diagnosis Date  . Anxiety   . Asthma   . Panic attacks   . Reflux     Patient Active Problem List   Diagnosis Date Noted  . Panic attack 12/03/2015  . Anxiety 06/16/2014  . Acid reflux 06/16/2014    Past Surgical History:  Procedure Laterality Date  . URETHRA SURGERY  2000?   stricture?    Prior to Admission medications   Medication Sig Start Date End Date Taking? Authorizing Provider  albuterol (PROVENTIL HFA;VENTOLIN HFA) 108 (90 Base) MCG/ACT inhaler Inhale into the lungs every 6 (six) hours as needed for wheezing or  shortness of breath.    [provider]  benzonatate (TESSALON PERLES) 100 MG capsule Take 1 capsule (100 mg total) by mouth 3 (three) times daily as needed for cough. 03/05/17 03/05/18  Enid Derry, PA-C  brompheniramine-pseudoephedrine-DM 30-2-10 MG/5ML syrup Take 5 mLs by mouth 4 (four) times daily as needed. 08/23/16   Joni Reining, PA-C  cetirizine (ZYRTEC ALLERGY) 10 MG tablet Take 1 tablet (10 mg total) by mouth daily. 03/05/17   Enid Derry, PA-C  clonazePAM (KLONOPIN) 0.5 MG tablet Take 0.5 mg by mouth 3 (three) times daily as needed for anxiety.  11/29/14   [provider]  fluticasone (FLONASE) 50 MCG/ACT nasal spray Place 2 sprays into both nostrils daily. 03/05/17 03/05/18  Enid Derry, PA-C  ibuprofen (ADVIL,MOTRIN) 600 MG tablet Take 1 tablet (600 mg total) by mouth every 8 (eight) hours as needed. 08/23/16   Joni Reining, PA-C  ketorolac (ACULAR) 0.5 % ophthalmic solution Place 1 drop into the left eye 4 (four) times daily. 04/07/17   Cuthriell, Delorise Royals, PA-C  lidocaine (XYLOCAINE) 2 % solution Use as directed 10 mLs in the mouth or throat as needed for mouth pain. 03/05/17   Enid Derry, PA-C  loratadine (CLARITIN) 10 MG tablet Take 1 tablet (10 mg total) by mouth daily. 03/02/16   Hagler, Jami L, PA-C  predniSONE (DELTASONE) 10 MG tablet Take  6 tablets on day 1, take 5 tablets on day 2, take 4 tablets on day 3, take 3 tablets on day 4, take 2 tablets on day 5, take 1 tablet on day 6 03/12/17   Enid Derry, PA-C  sulfacetamide (BLEPH-10) 10 % ophthalmic solution Place 2 drops into both eyes 4 (four) times daily. 03/14/16   Beers, Charmayne Sheer, PA-C  trimethoprim-polymyxin b (POLYTRIM) ophthalmic solution Place 2 drops into the left eye every 6 (six) hours. 04/07/17   Cuthriell, Delorise Royals, PA-C    Allergies Codeine  Family History  Problem Relation Age of Onset  . Bladder Cancer Neg Hx   . Kidney cancer Neg Hx   . Prostate cancer Neg Hx     Social  History Social History  Substance Use Topics  . Smoking status: Current Every Day Smoker    Packs/day: 0.50    Types: Cigarettes  . Smokeless tobacco: Never Used  . Alcohol use Yes     Comment: socially on weekends    Review of Systems  Constitutional: No fever/chills Eyes: No visual changes. ENT: No sore throat. Cardiovascular: Chest tightness Respiratory: shortness of breath. Gastrointestinal: No abdominal pain.  No nausea, no vomiting.  No diarrhea.  No constipation. Genitourinary: Negative for dysuria. Musculoskeletal: Negative for back pain. Skin: Negative for rash. Neurological: Negative for headaches, focal weakness or numbness.   ____________________________________________   PHYSICAL EXAM:  VITAL SIGNS: ED Triage Vitals  Enc Vitals Group     BP 04/17/17 0054 122/82     Pulse Rate 04/17/17 0054 (!) 127     Resp 04/17/17 0054 (!) 24     Temp 04/17/17 0054 98.1 F (36.7 C)     Temp src --      SpO2 04/17/17 0054 98 %     Weight --      Height --      Head Circumference --      Peak Flow --      Pain Score 04/17/17 0050 7     Pain Loc --      Pain Edu? --      Excl. in GC? --     Constitutional: Alert and oriented. Well appearing and in no acute distress. Eyes: Conjunctivae are normal. PERRL. EOMI. Head: Atraumatic. Nose: No congestion/rhinnorhea. Mouth/Throat: Mucous membranes are moist.  Oropharynx non-erythematous. Cardiovascular: Normal rate, regular rhythm. Grossly normal heart sounds.  Good peripheral circulation. Respiratory: Normal respiratory effort.  No retractions. Lungs CTAB. Gastrointestinal: Soft and nontender. No distention. Positive bowel sounds Musculoskeletal: No lower extremity tenderness nor edema.   Neurologic:  Normal speech and language.  Skin:  Skin is warm, dry and intact. Marland Kitchen Psychiatric: Mood and affect are normal. Speech and behavior are normal.  ____________________________________________   LABS (all labs ordered are  listed, but only abnormal results are displayed)  Labs Reviewed - No data to display ____________________________________________  EKG  ED ECG REPORT I, Rebecka Apley, the attending physician, personally viewed and interpreted this ECG.   Date: 04/17/2017  EKG Time: 0050  Rate: 123  Rhythm: sinus tachycardia  Axis: normal  Intervals:none  ST&T Change: none  ____________________________________________  RADIOLOGY  Dg Chest 2 View  Result Date: 04/17/2017 CLINICAL DATA:  Dyspnea and chest tightness EXAM: CHEST  2 VIEW COMPARISON:  03/11/2017 FINDINGS: The heart size and mediastinal contours are within normal limits. No pneumonic consolidation or significant pulmonary hyperinflation. No pulmonary edema. The visualized skeletal structures are unremarkable. IMPRESSION: No active cardiopulmonary disease.  Electronically Signed   By: Tollie Eth M.D.   On: 04/17/2017 01:17    ____________________________________________   PROCEDURES  Procedure(s) performed: None  Procedures  Critical Care performed: No  ____________________________________________   INITIAL IMPRESSION / ASSESSMENT AND PLAN / ED COURSE  Pertinent labs & imaging results that were available during my care of the patient were reviewed by me and considered in my medical decision making (see chart for details).  This is a 36 year old male who comes into the hospital today with some chest tightness. He reports that he thinks it may be his anxiety as he's had it before. He reports that his symptoms are completely resolved at this time. The pain was right-sided with no secondary symptoms aside from shortness of breath. The patient did have an EKG which was unremarkable and a chest x-ray that was unremarkable. I did not check any blood work given the patient's history, lack of risk factors and the resolution of his symptoms at this time. I will encourage the patient to follow-up with his primary care physician for  further evaluation. He'll be discharged home.      ____________________________________________   FINAL CLINICAL IMPRESSION(S) / ED DIAGNOSES  Final diagnoses:  Panic attack      NEW MEDICATIONS STARTED DURING THIS VISIT:  Discharge Medication List as of 04/17/2017  5:44 AM       Note:  This document was prepared using Dragon voice recognition software and may include unintentional dictation errors.    Rebecka Apley, MD 04/17/17 [phone]

## 2017-06-11 ENCOUNTER — Emergency Department: Payer: Self-pay

## 2017-06-11 ENCOUNTER — Encounter: Payer: Self-pay | Admitting: Emergency Medicine

## 2017-06-11 ENCOUNTER — Emergency Department
Admission: EM | Admit: 2017-06-11 | Discharge: 2017-06-11 | Disposition: A | Discharge status: Home / Self Care | Payer: Self-pay | Attending: Emergency Medicine | Admitting: Emergency Medicine

## 2017-06-11 DIAGNOSIS — Z79899 Other long term (current) drug therapy: Secondary | ICD-10-CM | POA: Insufficient documentation

## 2017-06-11 DIAGNOSIS — F17210 Nicotine dependence, cigarettes, uncomplicated: Secondary | ICD-10-CM | POA: Insufficient documentation

## 2017-06-11 DIAGNOSIS — F419 Anxiety disorder, unspecified: Secondary | ICD-10-CM | POA: Insufficient documentation

## 2017-06-11 DIAGNOSIS — J45909 Unspecified asthma, uncomplicated: Secondary | ICD-10-CM | POA: Insufficient documentation

## 2017-06-11 MED ORDER — HYDROXYZINE HCL 10 MG PO TABS: 10.0000 mg | ORAL_TABLET | Freq: Three times a day (TID) | ORAL | 0 refills | Status: DC | PRN

## 2017-06-11 MED ORDER — IPRATROPIUM-ALBUTEROL 0.5-2.5 (3) MG/3ML IN SOLN
3.0000 mL | Freq: Once | RESPIRATORY_TRACT | Status: AC
  Administered 2017-06-11: 3 mL via RESPIRATORY_TRACT
  Filled 2017-06-11: qty 3

## 2017-06-11 MED ORDER — ALBUTEROL SULFATE (2.5 MG/3ML) 0.083% IN NEBU: 2.5000 mg | INHALATION_SOLUTION | Freq: Four times a day (QID) | RESPIRATORY_TRACT | 12 refills | Status: DC | PRN

## 2017-06-11 NOTE — ED Triage Notes (Signed)
Was painting car and felt like he couldn't breath. Took 2 puffs from albuteral. Had taken his anxiety meds earlier. Wants to make sure not his heart

## 2017-06-11 NOTE — ED Triage Notes (Signed)
FIRST NURSE NOTE-thinks triggered asthma. Was outside spray painting car and now feels like having some difficulty breathing. Unlabored, NAD. Has not used his inhaler but is in pocket.

## 2017-06-11 NOTE — ED Notes (Signed)
Pt reports that he has asthma and is expressing chest tightness and shortness of breath - this started after he was painting today - he states he also suffers from panic attacks and this could be from that - he took asa when he arrived to the ed and he took clonazepam 0.5mg  also

## 2017-06-11 NOTE — ED Provider Notes (Signed)
[facility] Emergency Department Provider Note  ____________________________________________  Time seen: Approximately 3:53 PM  I have reviewed the triage vital signs and the nursing notes.   HISTORY  Chief Complaint Asthma    HPI Douglas Singh is a 36 y.o. male that presents to the emergency department for evaluation of shortness of breath this afternoon. Patient states that he was painting his car when he started to experience shortness of breath. He was not wearing a mask but was trying not to inhale any of the fumes. He started to feel short of breath, which then made him feel anxious. He took a clonazepam for his anxiety. While he was feeling anxious, his chest felt tight, primarily in the center. Symptoms felt the same as his panic attacks in the past. He is not sure if he is anxious currently. He has allergies and asthma. He has an albuterol inhaler and used it twice. He has a nebulizer machine but does not have any medication for. He smokes a half a pack of cigarettes per day. He drinks alcohol regularly. He denies any drug use. No recent illness He denies nausea, vomiting, abdominal pain.  Past Medical History:  Diagnosis Date  . Anxiety   . Asthma   . Panic attacks   . Reflux     Patient Active Problem List   Diagnosis Date Noted  . Panic attack 12/03/2015  . Anxiety 06/16/2014  . Acid reflux 06/16/2014    Past Surgical History:  Procedure Laterality Date  . URETHRA SURGERY  2000?   stricture?    Prior to Admission medications   Medication Sig Start Date End Date Taking? Authorizing Provider  albuterol (PROVENTIL) (2.5 MG/3ML) 0.083% nebulizer solution Take 3 mLs (2.5 mg total) by nebulization every 6 (six) hours as needed for wheezing or shortness of breath. 06/11/17   Enid Derry, PA-C  benzonatate (TESSALON PERLES) 100 MG capsule Take 1 capsule (100 mg total) by mouth 3 (three) times daily as needed for cough. 03/05/17 03/05/18  Enid Derry, PA-C  brompheniramine-pseudoephedrine-DM 30-2-10 MG/5ML syrup Take 5 mLs by mouth 4 (four) times daily as needed. 08/23/16   Joni Reining, PA-C  cetirizine (ZYRTEC ALLERGY) 10 MG tablet Take 1 tablet (10 mg total) by mouth daily. 03/05/17   Enid Derry, PA-C  clonazePAM (KLONOPIN) 0.5 MG tablet Take 0.5 mg by mouth 3 (three) times daily as needed for anxiety.  11/29/14   [provider]  fluticasone (FLONASE) 50 MCG/ACT nasal spray Place 2 sprays into both nostrils daily. 03/05/17 03/05/18  Enid Derry, PA-C  hydrOXYzine (ATARAX/VISTARIL) 10 MG tablet Take 1 tablet (10 mg total) by mouth 3 (three) times daily as needed. 06/11/17   Enid Derry, PA-C  ibuprofen (ADVIL,MOTRIN) 600 MG tablet Take 1 tablet (600 mg total) by mouth every 8 (eight) hours as needed. 08/23/16   Joni Reining, PA-C  ketorolac (ACULAR) 0.5 % ophthalmic solution Place 1 drop into the left eye 4 (four) times daily. 04/07/17   Cuthriell, Delorise Royals, PA-C  lidocaine (XYLOCAINE) 2 % solution Use as directed 10 mLs in the mouth or throat as needed for mouth pain. 03/05/17   Enid Derry, PA-C  loratadine (CLARITIN) 10 MG tablet Take 1 tablet (10 mg total) by mouth daily. 03/02/16   Hagler, Jami L, PA-C  predniSONE (DELTASONE) 10 MG tablet Take 6 tablets on day 1, take 5 tablets on day 2, take 4 tablets on day 3, take 3 tablets on day 4, take 2  tablets on day 5, take 1 tablet on day 6 03/12/17   Enid Derry, PA-C  sulfacetamide (BLEPH-10) 10 % ophthalmic solution Place 2 drops into both eyes 4 (four) times daily. 03/14/16   Beers, Charmayne Sheer, PA-C  trimethoprim-polymyxin b (POLYTRIM) ophthalmic solution Place 2 drops into the left eye every 6 (six) hours. 04/07/17   Cuthriell, Delorise Royals, PA-C    Allergies Codeine  Family History  Problem Relation Age of Onset  . Bladder Cancer Neg Hx   . Kidney cancer Neg Hx   . Prostate cancer Neg Hx     Social History Social History  Substance Use Topics  . Smoking  status: Current Every Day Smoker    Packs/day: 0.50    Types: Cigarettes  . Smokeless tobacco: Never Used  . Alcohol use Yes     Comment: socially on weekends     Review of Systems  Constitutional: No fever/chills Respiratory: No cough.  Gastrointestinal: No abdominal pain.  No nausea, no vomiting.  Musculoskeletal: Negative for musculoskeletal pain. Skin: Negative for rash, abrasions, lacerations, ecchymosis. Neurological: Negative for headaches, numbness or tingling   ____________________________________________   PHYSICAL EXAM:  VITAL SIGNS: ED Triage Vitals  Enc Vitals Group     BP 06/11/17 1435 (!) 153/90     Pulse Rate 06/11/17 1432 85     Resp 06/11/17 1432 18     Temp 06/11/17 1432 97.9 F (36.6 C)     Temp src --      SpO2 06/11/17 1432 100 %     Weight 06/11/17 1433 220 lb (99.8 kg)     Height 06/11/17 1433 5' 8.5" (1.74 m)     Head Circumference --      Peak Flow --      Pain Score 06/11/17 1432 0     Pain Loc --      Pain Edu? --      Excl. in GC? --      Constitutional: Alert and oriented. Anxious Eyes: Conjunctivae are normal. PERRL. EOMI. Head: Atraumatic. ENT:      Ears:      Nose: No congestion/rhinnorhea.      Mouth/Throat: Mucous membranes are moist.  Neck: No stridor.  Cardiovascular: Normal rate, regular rhythm.  Good peripheral circulation. Respiratory: Normal respiratory effort without tachypnea or retractions. Lungs CTAB. Good air entry to the bases with no decreased or absent breath sounds. Gastrointestinal: Bowel sounds 4 quadrants. Soft and nontender to palpation. No guarding or rigidity. No palpable masses. No distention.  Musculoskeletal: Full range of motion to all extremities. No gross deformities appreciated. Neurologic:  Normal speech and language. No gross focal neurologic deficits are appreciated.  Skin:  Skin is warm, dry and intact. No rash noted.   ____________________________________________   LABS (all labs  ordered are listed, but only abnormal results are displayed)  Labs Reviewed - No data to display ____________________________________________  EKG   ____________________________________________  RADIOLOGY Lexine Baton, personally viewed and evaluated these images (plain radiographs) as part of my medical decision making, as well as reviewing the written report by the radiologist.  Dg Chest 2 View  Result Date: 06/11/2017 CLINICAL DATA:  Asthma.  Shortness of breath. EXAM: CHEST  2 VIEW COMPARISON:  04/17/2017 FINDINGS: Cardiomediastinal silhouette is normal. Mediastinal contours appear intact. There is no evidence of focal airspace consolidation, pleural effusion or pneumothorax. Osseous structures are without acute abnormality. Soft tissues are grossly normal. IMPRESSION: No active cardiopulmonary disease. Electronically Signed   By:  Ted Mcalpine M.D.   On: 06/11/2017 15:36    ____________________________________________    PROCEDURES  Procedure(s) performed:    Procedures    Medications  ipratropium-albuterol (DUONEB) 0.5-2.5 (3) MG/3ML nebulizer solution 3 mL (3 mLs Nebulization Given 06/11/17 1513)     ____________________________________________   INITIAL IMPRESSION / ASSESSMENT AND PLAN / ED COURSE  Pertinent labs & imaging results that were available during my care of the patient were reviewed by me and considered in my medical decision making (see chart for details).  Review of the [place] CSRS was performed in accordance of the [facility] prior to dispensing any controlled drugs.   Patient presented to the emergency department for evaluation shortness of breath since this afternoon. Symptoms are consistent with asthma and anxiety. Patient appeared anxious while in the room. Symptoms completely resolved while patient was in the emergency department. He states that he feels much calmer now. He is not having any SOB and feels good. Vital signs and exam are  reassuring. Chest xray negative for acute cardiopulmonary processes. Patient will be discharged home with prescriptions for hydroxyzine and nebulizer solution. Patient is to follow up with PCP as directed. Patient is given ED precautions to return to the ED for any worsening or new symptoms.     ____________________________________________  FINAL CLINICAL IMPRESSION(S) / ED DIAGNOSES  Final diagnoses:  Anxiety  Asthma, unspecified asthma severity, unspecified whether complicated, unspecified whether persistent      NEW MEDICATIONS STARTED DURING THIS VISIT:  Discharge Medication List as of 06/11/2017  4:36 PM    START taking these medications   Details  albuterol (PROVENTIL) (2.5 MG/3ML) 0.083% nebulizer solution Take 3 mLs (2.5 mg total) by nebulization every 6 (six) hours as needed for wheezing or shortness of breath., Starting Sun 06/11/2017, Print    hydrOXYzine (ATARAX/VISTARIL) 10 MG tablet Take 1 tablet (10 mg total) by mouth 3 (three) times daily as needed., Starting Sun 06/11/2017, Print            This chart was dictated using voice recognition software/Dragon. Despite best efforts to proofread, errors can occur which can change the meaning. Any change was purely unintentional.     Enid Derry, PA-C 06/11/17 1847    Minna Antis, MD 06/13/17 2250

## 2017-06-11 NOTE — ED Notes (Signed)
Pt anxious in triage, talking non stop and worried that its his heart and not just his asthma from breathing in pain fumes. ekg performed and signed

## 2017-07-21 ENCOUNTER — Other Ambulatory Visit: Payer: Self-pay

## 2017-07-21 ENCOUNTER — Emergency Department
Admission: EM | Admit: 2017-07-21 | Discharge: 2017-07-21 | Disposition: A | Discharge status: Home / Self Care | Payer: Self-pay | Attending: Emergency Medicine | Admitting: Emergency Medicine

## 2017-07-21 ENCOUNTER — Encounter: Payer: Self-pay | Admitting: Emergency Medicine

## 2017-07-21 DIAGNOSIS — F17210 Nicotine dependence, cigarettes, uncomplicated: Secondary | ICD-10-CM | POA: Insufficient documentation

## 2017-07-21 DIAGNOSIS — Z79899 Other long term (current) drug therapy: Secondary | ICD-10-CM | POA: Insufficient documentation

## 2017-07-21 DIAGNOSIS — J45909 Unspecified asthma, uncomplicated: Secondary | ICD-10-CM | POA: Insufficient documentation

## 2017-07-21 DIAGNOSIS — F419 Anxiety disorder, unspecified: Secondary | ICD-10-CM | POA: Insufficient documentation

## 2017-07-21 NOTE — ED Notes (Signed)
Patient denies pain and is resting comfortably.  

## 2017-07-21 NOTE — ED Notes (Signed)
AAOx3.  Skin warm and dry.  NAD 

## 2017-07-21 NOTE — ED Triage Notes (Signed)
Patient states "My panic anxiety has gotten the best of me today"  Patient states he has been out of his Klonopin for about one week.  Into ED today to be sure it is ok to take medication today, because he was drinking last night.  Patient did pick up RX today and has medication with him.

## 2017-07-21 NOTE — ED Provider Notes (Signed)
[facility] Emergency Department Provider Note   ____________________________________________    I have reviewed the triage vital signs and the nursing notes.   HISTORY  Chief Complaint Multiple complaints    HPI Douglas Singh is a 36 y.o. male who presents with complaints of intermittent sore throat, "bubbling in his stomach occasionally "and also wondering whether he can take his Klonopin which he is prescribed even though he drank quite a lot of alcohol last night.  Currently the patient feels quite well and has none of the above symptoms.  He reports drinking heavily last night but is prescribed Klonopin for anxiety and is questioning whether he is okay to take it tonight.  He does complain of anxiety and suspects this may be the cause of his intermittent sore throat.   Past Medical History:  Diagnosis Date  . Anxiety   . Asthma   . Panic attacks   . Reflux     Patient Active Problem List   Diagnosis Date Noted  . Panic attack 12/03/2015  . Anxiety 06/16/2014  . Acid reflux 06/16/2014    Past Surgical History:  Procedure Laterality Date  . URETHRA SURGERY  2000?   stricture?    Prior to Admission medications   Medication Sig Start Date End Date Taking? Authorizing Provider  albuterol (PROVENTIL) (2.5 MG/3ML) 0.083% nebulizer solution Take 3 mLs (2.5 mg total) by nebulization every 6 (six) hours as needed for wheezing or shortness of breath. 06/11/17   Enid DerryWagner, Ashley, PA-C  benzonatate (TESSALON PERLES) 100 MG capsule Take 1 capsule (100 mg total) by mouth 3 (three) times daily as needed for cough. 03/05/17 03/05/18  Enid DerryWagner, Ashley, PA-C  brompheniramine-pseudoephedrine-DM 30-2-10 MG/5ML syrup Take 5 mLs by mouth 4 (four) times daily as needed. 08/23/16   Joni ReiningSmith, Ronald K, PA-C  cetirizine (ZYRTEC ALLERGY) 10 MG tablet Take 1 tablet (10 mg total) by mouth daily. 03/05/17   Enid DerryWagner, Ashley, PA-C  clonazePAM (KLONOPIN) 0.5 MG tablet Take 0.5 mg by  mouth 3 (three) times daily as needed for anxiety.  11/29/14   [provider]  fluticasone (FLONASE) 50 MCG/ACT nasal spray Place 2 sprays into both nostrils daily. 03/05/17 03/05/18  Enid DerryWagner, Ashley, PA-C  hydrOXYzine (ATARAX/VISTARIL) 10 MG tablet Take 1 tablet (10 mg total) by mouth 3 (three) times daily as needed. 06/11/17   Enid DerryWagner, Ashley, PA-C  ibuprofen (ADVIL,MOTRIN) 600 MG tablet Take 1 tablet (600 mg total) by mouth every 8 (eight) hours as needed. 08/23/16   Joni ReiningSmith, Ronald K, PA-C  ketorolac (ACULAR) 0.5 % ophthalmic solution Place 1 drop into the left eye 4 (four) times daily. 04/07/17   Cuthriell, Delorise RoyalsJonathan D, PA-C  lidocaine (XYLOCAINE) 2 % solution Use as directed 10 mLs in the mouth or throat as needed for mouth pain. 03/05/17   Enid DerryWagner, Ashley, PA-C  loratadine (CLARITIN) 10 MG tablet Take 1 tablet (10 mg total) by mouth daily. 03/02/16   Hagler, Jami L, PA-C  predniSONE (DELTASONE) 10 MG tablet Take 6 tablets on day 1, take 5 tablets on day 2, take 4 tablets on day 3, take 3 tablets on day 4, take 2 tablets on day 5, take 1 tablet on day 6 03/12/17   Enid DerryWagner, Ashley, PA-C  sulfacetamide (BLEPH-10) 10 % ophthalmic solution Place 2 drops into both eyes 4 (four) times daily. 03/14/16   Beers, Charmayne Sheerharles M, PA-C  trimethoprim-polymyxin b (POLYTRIM) ophthalmic solution Place 2 drops into the left eye every 6 (six) hours. 04/07/17  Cuthriell, Delorise RoyalsJonathan D, PA-C     Allergies Codeine  Family History  Problem Relation Age of Onset  . Bladder Cancer Neg Hx   . Kidney cancer Neg Hx   . Prostate cancer Neg Hx     Social History Social History   Tobacco Use  . Smoking status: Current Every Day Smoker    Packs/day: 0.50    Types: Cigarettes  . Smokeless tobacco: Never Used  Substance Use Topics  . Alcohol use: Yes    Comment: socially on weekends  . Drug use: No    Review of Systems  Constitutional: No fever/chills Eyes: No visual changes.  ENT: As above Cardiovascular: Denies chest  pain. Respiratory: Denies shortness of breath. Gastrointestinal: No abdominal pain.  No nausea, no vomiting.   Genitourinary: Negative for dysuria. Musculoskeletal: Negative for back pain. Skin: Negative for rash. Neurological: Negative for headaches    ____________________________________________   PHYSICAL EXAM:  VITAL SIGNS: ED Triage Vitals  Enc Vitals Group     BP 07/21/17 1452 (!) 150/88     Pulse Rate 07/21/17 1452 73     Resp 07/21/17 1452 18     Temp 07/21/17 1452 98.8 F (37.1 C)     Temp Source 07/21/17 1452 Oral     SpO2 07/21/17 1452 100 %     Weight 07/21/17 1451 99.8 kg (220 lb)     Height 07/21/17 1451 1.727 m (5\' 8" )     Head Circumference --      Peak Flow --      Pain Score 07/21/17 1451 0     Pain Loc --      Pain Edu? --      Excl. in GC? --     Constitutional: Alert and oriented. No acute distress. Pleasant and interactive Eyes: Conjunctivae are normal.   Nose: No congestion/rhinnorhea. Mouth/Throat: Mucous membranes are moist.  Pharynx is normal  Cardiovascular: Normal rate, regular rhythm.  Good peripheral circulation. Respiratory: Normal respiratory effort.  No retractions.  Gastrointestinal: Soft and nontender. No distention.  No CVA tenderness. Genitourinary: deferred Musculoskeletal: No lower extremity tenderness nor edema.  Warm and well perfused Neurologic:  Normal speech and language. No gross focal neurologic deficits are appreciated.  Skin:  Skin is warm, dry and intact. No rash noted. Psychiatric: Mood and affect are normal. Speech and behavior are normal.  ____________________________________________   LABS (all labs ordered are listed, but only abnormal results are displayed)  Labs Reviewed - No data to display ____________________________________________  EKG  None ____________________________________________  RADIOLOGY  None ____________________________________________   PROCEDURES  Procedure(s) performed:  No  Procedures   Critical Care performedNo ____________________________________________   INITIAL IMPRESSION / ASSESSMENT AND PLAN / ED COURSE  Pertinent labs & imaging results that were available during my care of the patient were reviewed by me and considered in my medical decision making (see chart for details).  Patient well-appearing and completely asymptomatic.  Counseled patient that he can take his Klonopin but must not drink alcohol.  Given asymptomatic no further workup necessary at this time    ____________________________________________   FINAL CLINICAL IMPRESSION(S) / ED DIAGNOSES  Final diagnoses:  Anxiety        Note:  This document was prepared using Dragon voice recognition software and may include unintentional dictation errors.    Jene EveryKinner, Kalii Chesmore, MD 07/21/17 [id number]

## 2017-08-02 ENCOUNTER — Emergency Department: Payer: Self-pay

## 2017-08-02 ENCOUNTER — Other Ambulatory Visit: Payer: Self-pay

## 2017-08-02 ENCOUNTER — Emergency Department
Admission: EM | Admit: 2017-08-02 | Discharge: 2017-08-02 | Disposition: A | Discharge status: Home / Self Care | Payer: Self-pay | Attending: Emergency Medicine | Admitting: Emergency Medicine

## 2017-08-02 DIAGNOSIS — J45909 Unspecified asthma, uncomplicated: Secondary | ICD-10-CM | POA: Insufficient documentation

## 2017-08-02 DIAGNOSIS — K219 Gastro-esophageal reflux disease without esophagitis: Secondary | ICD-10-CM | POA: Insufficient documentation

## 2017-08-02 DIAGNOSIS — R002 Palpitations: Secondary | ICD-10-CM | POA: Insufficient documentation

## 2017-08-02 DIAGNOSIS — F17210 Nicotine dependence, cigarettes, uncomplicated: Secondary | ICD-10-CM | POA: Insufficient documentation

## 2017-08-02 DIAGNOSIS — F419 Anxiety disorder, unspecified: Secondary | ICD-10-CM | POA: Insufficient documentation

## 2017-08-02 LAB — CBC
HCT: 49.1 % (ref 40.0–52.0)
HEMOGLOBIN: 16.3 g/dL (ref 13.0–18.0)
MCH: 29.7 pg (ref 26.0–34.0)
MCHC: 33.2 g/dL (ref 32.0–36.0)
MCV: 89.5 fL (ref 80.0–100.0)
Platelets: 309 10*3/uL (ref 150–440)
RBC: 5.49 MIL/uL (ref 4.40–5.90)
RDW: 14.9 % — ABNORMAL HIGH (ref 11.5–14.5)
WBC: 12.6 10*3/uL — ABNORMAL HIGH (ref 3.8–10.6)

## 2017-08-02 LAB — BASIC METABOLIC PANEL
ANION GAP: 23 — AB (ref 5–15)
BUN: 11 mg/dL (ref 6–20)
CALCIUM: 10.1 mg/dL (ref 8.9–10.3)
CHLORIDE: 98 mmol/L — AB (ref 101–111)
CO2: 17 mmol/L — AB (ref 22–32)
Creatinine, Ser: 0.91 mg/dL (ref 0.61–1.24)
GFR calc Af Amer: >60 mL/min (ref >60)
GFR calc non Af Amer: >60 mL/min (ref >60)
GLUCOSE: 66 mg/dL (ref 65–99)
Potassium: 3.6 mmol/L (ref 3.5–5.1)
Sodium: 138 mmol/L (ref 135–145)

## 2017-08-02 LAB — TROPONIN I

## 2017-08-02 MED ORDER — CLONAZEPAM 1 MG PO TABS
1.0000 mg | ORAL_TABLET | Freq: Once | ORAL | Status: AC
  Administered 2017-08-02: 1 mg via ORAL

## 2017-08-02 MED ORDER — FAMOTIDINE 20 MG PO TABS: 20.0000 mg | ORAL_TABLET | Freq: Two times a day (BID) | ORAL | 1 refills | Status: DC

## 2017-08-02 MED ORDER — CLONAZEPAM 1 MG PO TABS: 1.0000 mg | ORAL_TABLET | Freq: Two times a day (BID) | ORAL | 0 refills | Status: DC | PRN

## 2017-08-02 MED ORDER — CLONAZEPAM 0.5 MG PO TABS
ORAL_TABLET | ORAL | Status: AC
  Administered 2017-08-02: 1 mg via ORAL
  Filled 2017-08-02: qty 2

## 2017-08-02 MED ORDER — FAMOTIDINE 20 MG PO TABS
20.0000 mg | ORAL_TABLET | Freq: Once | ORAL | Status: AC
  Administered 2017-08-02: 20 mg via ORAL
  Filled 2017-08-02: qty 1

## 2017-08-02 NOTE — ED Provider Notes (Addendum)
[facility] Emergency Department Provider Note       Time seen: ----------------------------------------- 1:40 PM on 08/02/2017 -----------------------------------------   I have reviewed the triage vital signs and the nursing notes.  HISTORY   Chief Complaint Chest Pain and Palpitations   HPI Douglas Singh is a 36 y.o. male with a history of anxiety and asthma who presents to the ED for chest tightness and palpitations to the left and center part of his chest.  Patient reports he woke him from sleep and he has a history of similar.  Discomfort is 7 out of 10, nothing makes it better or worse.  Past Medical History:  Diagnosis Date  . Anxiety   . Asthma   . Panic attacks   . Reflux     Patient Active Problem List   Diagnosis Date Noted  . Panic attack 12/03/2015  . Anxiety 06/16/2014  . Acid reflux 06/16/2014    Past Surgical History:  Procedure Laterality Date  . URETHRA SURGERY  2000?   stricture?    Allergies Codeine  Social History Social History   Tobacco Use  . Smoking status: Current Every Day Smoker    Packs/day: 0.50    Types: Cigarettes  . Smokeless tobacco: Never Used  Substance Use Topics  . Alcohol use: Yes    Comment: socially on weekends  . Drug use: No    Review of Systems Constitutional: Negative for fever. Eyes: Negative for vision changes ENT:  Negative for congestion, sore throat Cardiovascular: Positive for chest tightness palpitations Respiratory: Negative for shortness of breath. Gastrointestinal: Negative for abdominal pain, vomiting and diarrhea. Musculoskeletal: Negative for back pain. Skin: Negative for rash. Neurological: Negative for headaches, focal weakness or numbness.  All systems negative/normal/unremarkable except as stated in the HPI  ____________________________________________   PHYSICAL EXAM:  VITAL SIGNS: ED Triage Vitals  Enc Vitals Group     BP 08/02/17 1057 (!) 144/80   Pulse Rate 08/02/17 1057 (!) 118     Resp 08/02/17 1057 20     Temp 08/02/17 1057 98.6 F (37 C)     Temp Source 08/02/17 1057 Oral     SpO2 08/02/17 1057 100 %     Weight 08/02/17 1058 200 lb (90.7 kg)     Height 08/02/17 1058 5\' 8"  (1.727 m)     Head Circumference --      Peak Flow --      Pain Score 08/02/17 1057 7     Pain Loc --      Pain Edu? --      Excl. in GC? --     Constitutional: Alert and oriented.  Anxious, no distress Eyes: Conjunctivae are normal. Normal extraocular movements. ENT   Head: Normocephalic and atraumatic.   Nose: No congestion/rhinnorhea.   Mouth/Throat: Mucous membranes are moist.   Neck: No stridor. Cardiovascular: Rapid rate, regular rhythm. No murmurs, rubs, or gallops. Respiratory: Normal respiratory effort without tachypnea nor retractions. Breath sounds are clear and equal bilaterally. No wheezes/rales/rhonchi. Gastrointestinal: Soft and nontender. Normal bowel sounds Musculoskeletal: Nontender with normal range of motion in extremities. No lower extremity tenderness nor edema. Neurologic:  Normal speech and language. No gross focal neurologic deficits are appreciated.  Skin:  Skin is warm, dry and intact. No rash noted. Psychiatric: Mood and affect are normal. Speech and behavior are normal.  ____________________________________________  EKG: Interpreted by me.  Sinus tachycardia with rate of 113 bpm, normal PR interval, normal QRS, normal QT.  ____________________________________________  ED COURSE:  Pertinent labs & imaging results that were available during my care of the patient were reviewed by me and considered in my medical decision making (see chart for details). Patient presents for chest tightness and palpitations, we will assess with labs and imaging as indicated.   Procedures ____________________________________________   LABS (pertinent positives/negatives)  Labs Reviewed  BASIC METABOLIC PANEL - Abnormal;  Notable for the following components:      Result Value   Chloride 98 (*)    CO2 17 (*)    Anion gap 23 (*)    All other components within normal limits  CBC - Abnormal; Notable for the following components:   WBC 12.6 (*)    RDW 14.9 (*)    All other components within normal limits  TROPONIN I    RADIOLOGY Images were viewed by me  Chest x-ray is normal  ____________________________________________  DIFFERENTIAL DIAGNOSIS   Anxiety, GERD, arrhythmia, unstable angina, drug or alcohol abuse  FINAL ASSESSMENT AND PLAN  Anxiety, GERD   Plan: Patient had presented for palpitations. Patient's labs are reassuring. Patient's imaging is unremarkable.  Patient is well-known to the ER and has a diarrhea and this is consistent with his previous presentations.  He also has informed me that he has run out of his Klonopin.  He is stable for outpatient follow-up.   Emily FilbertWilliams, Donya Tomaro E, MD   Note: This note was generated in part or whole with voice recognition software. Voice recognition is usually quite accurate but there are transcription errors that can and very often do occur. I apologize for any typographical errors that were not detected and corrected.     Emily FilbertWilliams, Jasmain Ahlberg E, MD 08/02/17 1344    Emily FilbertWilliams, Lori-Ann Lindfors E, MD 08/02/17 [phone]

## 2017-08-02 NOTE — ED Notes (Signed)
Pt sleeping in lobby chair, RR even, color WNL. Pt in NAD.

## 2017-08-02 NOTE — ED Triage Notes (Signed)
Pt to ER via POV c/o chest tightness and palpitations to left and center part of chest. Awakened patient from sleep. Hx of similar. Pt alert and oriented X4, active, cooperative, pt in NAD. RR even and unlabored, color WNL.

## 2017-09-12 ENCOUNTER — Encounter: Payer: Self-pay | Admitting: Medical Oncology

## 2017-09-12 ENCOUNTER — Emergency Department
Admission: EM | Admit: 2017-09-12 | Discharge: 2017-09-12 | Disposition: A | Discharge status: Home / Self Care | Payer: Self-pay | Attending: Emergency Medicine | Admitting: Emergency Medicine

## 2017-09-12 DIAGNOSIS — F17210 Nicotine dependence, cigarettes, uncomplicated: Secondary | ICD-10-CM | POA: Insufficient documentation

## 2017-09-12 DIAGNOSIS — J4530 Mild persistent asthma, uncomplicated: Secondary | ICD-10-CM | POA: Insufficient documentation

## 2017-09-12 DIAGNOSIS — J45909 Unspecified asthma, uncomplicated: Secondary | ICD-10-CM

## 2017-09-12 DIAGNOSIS — J069 Acute upper respiratory infection, unspecified: Secondary | ICD-10-CM | POA: Insufficient documentation

## 2017-09-12 DIAGNOSIS — Z760 Encounter for issue of repeat prescription: Secondary | ICD-10-CM | POA: Insufficient documentation

## 2017-09-12 MED ORDER — CLONAZEPAM 0.5 MG PO TABS: 0.5000 mg | ORAL_TABLET | Freq: Three times a day (TID) | ORAL | 0 refills | Status: DC | PRN

## 2017-09-12 MED ORDER — ALBUTEROL SULFATE HFA 108 (90 BASE) MCG/ACT IN AERS: 2.0000 | INHALATION_SPRAY | Freq: Four times a day (QID) | RESPIRATORY_TRACT | 2 refills | Status: DC | PRN

## 2017-09-12 MED ORDER — BENZONATATE 100 MG PO CAPS: 200.0000 mg | ORAL_CAPSULE | Freq: Three times a day (TID) | ORAL | 0 refills | Status: DC | PRN

## 2017-09-12 MED ORDER — CLONAZEPAM 1 MG PO TABS: 1.0000 mg | ORAL_TABLET | Freq: Two times a day (BID) | ORAL | 0 refills | Status: DC | PRN

## 2017-09-12 NOTE — ED Notes (Signed)
See triage note  Presents with some nasal congestion  No fever  resp even and non labored  also states he is out of klonopin  States his clinic is closed and waiting to get into another

## 2017-09-12 NOTE — Discharge Instructions (Signed)
Keep your appointment at [facility].  Also call the clinics listed on your  discharge papers for a primary provider. Get your albuterol inhaler field and use as needed for wheezing. Tessalon Perles if needed for cough. Increase fluids.  Take Tylenol or ibuprofen if needed.

## 2017-09-12 NOTE — ED Triage Notes (Signed)
Pt reports this am he woke up feeling congested and like his asthma was flaring up. Pt reports he has ran out of his albuterol. Pt talking in complete sentences without difficulty.

## 2017-09-12 NOTE — ED Provider Notes (Signed)
[facility] Emergency Department Provider Note  ____________________________________________   First MD Initiated Contact with Patient 09/12/17 [phone]     (approximate)  I have reviewed the triage vital signs and the nursing notes.   HISTORY  Chief Complaint Asthma and Nasal Congestion   HPI Douglas Singh is a 37 y.o. male is here with complaint of congestion and feels that his asthma is going to flareup.  Patient states he ran out of his albuterol inhaler 2 days ago.  He denies any fever or chills.  There is been no nausea, vomiting, diarrhea or muscle aches.  Patient states that the clinic he was going to has closed and he has been referred to [facility] for his anxiety.  He has an appointment for evaluation.  Currently he states that he has been taking Klonopin 0.5 mg twice a day for anxiety.  Currently he is completely out of medication.   Past Medical History:  Diagnosis Date  . Anxiety   . Asthma   . Panic attacks   . Reflux     Patient Active Problem List   Diagnosis Date Noted  . Panic attack 12/03/2015  . Anxiety 06/16/2014  . Acid reflux 06/16/2014    Past Surgical History:  Procedure Laterality Date  . URETHRA SURGERY  2000?   stricture?    Prior to Admission medications   Medication Sig Start Date End Date Taking? Authorizing Provider  albuterol (PROVENTIL HFA;VENTOLIN HFA) 108 (90 Base) MCG/ACT inhaler Inhale 2 puffs into the lungs every 6 (six) hours as needed for wheezing or shortness of breath. 09/12/17   Tommi Rumps, PA-C  benzonatate (TESSALON PERLES) 100 MG capsule Take 2 capsules (200 mg total) by mouth 3 (three) times daily as needed. 09/12/17 09/12/18  Tommi Rumps, PA-C  clonazePAM (KLONOPIN) 1 MG tablet Take 1 tablet (1 mg total) by mouth 2 (two) times daily as needed for up to 12 days for anxiety. 09/12/17 09/24/17  Tommi Rumps, PA-C    Allergies Codeine  Family History  Problem Relation Age of Onset  . Bladder  Cancer Neg Hx   . Kidney cancer Neg Hx   . Prostate cancer Neg Hx     Social History Social History   Tobacco Use  . Smoking status: Current Every Day Smoker    Packs/day: 0.50    Types: Cigarettes  . Smokeless tobacco: Never Used  Substance Use Topics  . Alcohol use: Yes    Comment: socially on weekends  . Drug use: No    Review of Systems Constitutional: No fever/chills Eyes: No visual changes. ENT: Positive nasal congestion. Cardiovascular: Denies chest pain. Respiratory: Denies shortness of breath. Gastrointestinal: No abdominal pain.  No nausea, no vomiting.  No diarrhea.   Musculoskeletal: Negative for back pain. Skin: Negative for rash. Neurological: Negative for headaches, focal weakness or numbness. ___________________________________________   PHYSICAL EXAM:  VITAL SIGNS: ED Triage Vitals [09/12/17 0712]  Enc Vitals Group     BP (!) 157/98     Pulse Rate 87     Resp 16     Temp 98.2 F (36.8 C)     Temp Source Oral     SpO2 98 %     Weight      Height 5\' 8"  (1.727 m)     Head Circumference      Peak Flow      Pain Score      Pain Loc      Pain  Edu?      Excl. in GC?    Constitutional: Alert and oriented. Well appearing and in no acute distress. Eyes: Conjunctivae are normal.  Head: Atraumatic. Nose: Moderate congestion/rhinnorhea. Mouth/Throat: Mucous membranes are moist.  Oropharynx non-erythematous.  Positive posterior drainage. Neck: No stridor.   Hematological/Lymphatic/Immunilogical: No cervical lymphadenopathy. Cardiovascular: Normal rate, regular rhythm. Grossly normal heart sounds.  Good peripheral circulation. Respiratory: Normal respiratory effort.  No retractions. Lungs CTAB.  No wheezes heard throughout. Musculoskeletal: Moves upper and lower extremities without any difficulty normal gait was noted. Neurologic:  Normal speech and language. No gross focal neurologic deficits are appreciated. No gait instability. Skin:  Skin is warm,  dry and intact. No rash noted. Psychiatric: Mood and affect are normal. Speech and behavior are normal.  ____________________________________________   LABS (all labs ordered are listed, but only abnormal results are displayed)  Labs Reviewed - No data to display  PROCEDURES  Procedure(s) performed: None  Procedures  Critical Care performed: No  ____________________________________________   INITIAL IMPRESSION / ASSESSMENT AND PLAN / ED COURSE Patient was given prescription for albuterol inhaler as needed for his asthma.  He was also given a prescription for Tessalon Perles 1 every 8 hours as needed for cough.  Patient currently does not have a PCP and is scheduled to be evaluated [facility] for his anxiety.  [place] database shows that he did fill a prescription December 17 for Klonopin 1 mg #30.  Patient has been consistent in getting 30 tablets each month from his previous provider.  We discussed [facility], [facility], [facility]  for control of his asthma.   ____________________________________________   FINAL CLINICAL IMPRESSION(S) / ED DIAGNOSES  Final diagnoses:  Upper respiratory tract infection, unspecified type  Mild asthma, unspecified whether complicated, unspecified whether persistent  Medication refill     ED Discharge Orders        Ordered    albuterol (PROVENTIL HFA;VENTOLIN HFA) 108 (90 Base) MCG/ACT inhaler  Every 6 hours PRN     09/12/17 0754    benzonatate (TESSALON PERLES) 100 MG capsule  3 times daily PRN     09/12/17 0759    clonazePAM (KLONOPIN) 1 MG tablet  2 times daily PRN,   Status:  Discontinued     09/12/17 0845    clonazePAM (KLONOPIN) 0.5 MG tablet  3 times daily PRN,   Status:  Discontinued     09/12/17 0845    clonazePAM (KLONOPIN) 1 MG tablet  2 times daily PRN     09/12/17 0847       Note:  This document was prepared using Dragon voice recognition software and may include unintentional  dictation errors.    Tommi RumpsSummers, Oree Mirelez L, PA-C 09/12/17 1026    Tommi RumpsSummers, Levonte Molina L, PA-C 09/12/17 1026    Rockne MenghiniNorman, Anne-Caroline, MD 09/12/17 1200

## 2017-09-20 ENCOUNTER — Emergency Department: Payer: Self-pay

## 2017-09-20 ENCOUNTER — Encounter: Payer: Self-pay | Admitting: Emergency Medicine

## 2017-09-20 ENCOUNTER — Other Ambulatory Visit: Payer: Self-pay

## 2017-09-20 ENCOUNTER — Emergency Department
Admission: EM | Admit: 2017-09-20 | Discharge: 2017-09-20 | Disposition: A | Discharge status: Home / Self Care | Payer: Self-pay | Attending: Emergency Medicine | Admitting: Emergency Medicine

## 2017-09-20 DIAGNOSIS — Z5321 Procedure and treatment not carried out due to patient leaving prior to being seen by health care provider: Secondary | ICD-10-CM | POA: Insufficient documentation

## 2017-09-20 DIAGNOSIS — R079 Chest pain, unspecified: Secondary | ICD-10-CM | POA: Insufficient documentation

## 2017-09-20 LAB — TROPONIN I: Troponin I: <0.03 ng/mL (ref <0.03)

## 2017-09-20 LAB — BASIC METABOLIC PANEL
Anion gap: 12 (ref 5–15)
BUN: 9 mg/dL (ref 6–20)
CHLORIDE: 100 mmol/L — AB (ref 101–111)
CO2: 23 mmol/L (ref 22–32)
Calcium: 9.3 mg/dL (ref 8.9–10.3)
Creatinine, Ser: 0.73 mg/dL (ref 0.61–1.24)
GFR calc non Af Amer: >60 mL/min (ref >60)
Glucose, Bld: 69 mg/dL (ref 65–99)
POTASSIUM: 3.6 mmol/L (ref 3.5–5.1)
SODIUM: 135 mmol/L (ref 135–145)

## 2017-09-20 LAB — CBC
HEMATOCRIT: 44.2 % (ref 40.0–52.0)
HEMOGLOBIN: 15.3 g/dL (ref 13.0–18.0)
MCH: 29.9 pg (ref 26.0–34.0)
MCHC: 34.6 g/dL (ref 32.0–36.0)
MCV: 86.5 fL (ref 80.0–100.0)
Platelets: 232 10*3/uL (ref 150–440)
RBC: 5.11 MIL/uL (ref 4.40–5.90)
RDW: 14.5 % (ref 11.5–14.5)
WBC: 7 10*3/uL (ref 3.8–10.6)

## 2017-09-20 NOTE — ED Notes (Signed)
Pt  Called in the WR with no response

## 2017-09-20 NOTE — ED Notes (Signed)
Pt called in the WR with no response 

## 2017-09-20 NOTE — ED Triage Notes (Signed)
Pt here for left sided chest pain after taking a klonopin today.  Admits to drinking last night, no drugs.  Has had SHOB. Pain does not radiate.  Pain is intermittent and tight in nature. Ambulatory. Unlabored.  No cough or fevers.

## 2017-09-21 ENCOUNTER — Telehealth: Payer: Self-pay | Admitting: Emergency Medicine

## 2017-09-21 NOTE — Telephone Encounter (Signed)
Called patient after he left me a message.  He wanted to know if his blood and cxr were okay.  I explained that an exam was needed when he has had chest pain as labs and xrays cannot rule him out for cardiac problem.  He does not have a doctor or insurance.  I explained that he should get a doctor --explained [facility] as options.  Also informed him that he can return here anytime.

## 2017-09-24 ENCOUNTER — Encounter: Payer: Self-pay | Admitting: Emergency Medicine

## 2017-09-24 ENCOUNTER — Other Ambulatory Visit: Payer: Self-pay

## 2017-09-24 DIAGNOSIS — F17210 Nicotine dependence, cigarettes, uncomplicated: Secondary | ICD-10-CM | POA: Insufficient documentation

## 2017-09-24 DIAGNOSIS — G933 Postviral fatigue syndrome: Secondary | ICD-10-CM | POA: Insufficient documentation

## 2017-09-24 DIAGNOSIS — J45909 Unspecified asthma, uncomplicated: Secondary | ICD-10-CM | POA: Insufficient documentation

## 2017-09-24 NOTE — ED Notes (Signed)
Pt said he hasn't taken any medications for symptoms; during med reconciliation pt says he did take a tessalon and used his inhaler with no relief

## 2017-09-24 NOTE — ED Triage Notes (Addendum)
Pt reports cough for about 2 weeks; has been here twice for same, last time was on the 23rd but he did not stay to be seen; blood work and chest xray performed that day; pt reports short of breath at times; back pain with cough; pt very talkative in triage with complete sentences; pt admits to taking a few shots of liquor tonight in hopes of loosening up the phlegm; it has not helped; lungs clear to auscultation in triage

## 2017-09-25 ENCOUNTER — Emergency Department
Admission: EM | Admit: 2017-09-25 | Discharge: 2017-09-25 | Disposition: A | Discharge status: Home / Self Care | Payer: Self-pay | Attending: Emergency Medicine | Admitting: Emergency Medicine

## 2017-09-25 ENCOUNTER — Other Ambulatory Visit: Payer: Self-pay

## 2017-09-25 DIAGNOSIS — G933 Postviral fatigue syndrome: Secondary | ICD-10-CM

## 2017-09-25 DIAGNOSIS — G9331 Postviral fatigue syndrome: Secondary | ICD-10-CM

## 2017-09-25 MED ORDER — IBUPROFEN 600 MG PO TABS
600.0000 mg | ORAL_TABLET | Freq: Once | ORAL | Status: AC
  Administered 2017-09-25: 600 mg via ORAL
  Filled 2017-09-25: qty 1

## 2017-09-25 MED ORDER — IBUPROFEN 600 MG PO TABS: 600.0000 mg | ORAL_TABLET | Freq: Three times a day (TID) | ORAL | 0 refills | Status: DC | PRN

## 2017-09-25 NOTE — Discharge Instructions (Signed)
It is normal for you to have a cough for up to a full 4 weeks after an infection in the way you had.  Please take ibuprofen up to 3 times a day as needed for symptomatic control and follow-up with primary care physician as needed.  Return to the emergency department for any concerns whatsoever.  It was a pleasure to take care of you today, and thank you for coming to our emergency department.  If you have any questions or concerns before leaving please ask the nurse to grab me and I'm more than happy to go through your aftercare instructions again.  If you were prescribed any opioid pain medication today such as Norco, Vicodin, Percocet, morphine, hydrocodone, or oxycodone please make sure you do not drive when you are taking this medication as it can alter your ability to drive safely.  If you have any concerns once you are home that you are not improving or are in fact getting worse before you can make it to your follow-up appointment, please do not hesitate to call 911 and come back for further evaluation.  Merrily BrittleNeil Reighan Hipolito, MD  Results for orders placed or performed during the hospital encounter of 09/20/17  Basic metabolic panel  Result Value Ref Range   Sodium 135 135 - 145 mmol/L   Potassium 3.6 3.5 - 5.1 mmol/L   Chloride 100 (L) 101 - 111 mmol/L   CO2 23 22 - 32 mmol/L   Glucose, Bld 69 65 - 99 mg/dL   BUN 9 6 - 20 mg/dL   Creatinine, Ser 1.610.73 0.61 - 1.24 mg/dL   Calcium 9.3 8.9 - 09.610.3 mg/dL   GFR calc non Af Amer >60 >60 mL/min   GFR calc Af Amer >60 >60 mL/min   Anion gap 12 5 - 15  CBC  Result Value Ref Range   WBC 7.0 3.8 - 10.6 K/uL   RBC 5.11 4.40 - 5.90 MIL/uL   Hemoglobin 15.3 13.0 - 18.0 g/dL   HCT 04.544.2 40.940.0 - 81.152.0 %   MCV 86.5 80.0 - 100.0 fL   MCH 29.9 26.0 - 34.0 pg   MCHC 34.6 32.0 - 36.0 g/dL   RDW 91.414.5 78.211.5 - 95.614.5 %   Platelets 232 150 - 440 K/uL  Troponin I  Result Value Ref Range   Troponin I <0.03 <0.03 ng/mL   Dg Chest 2 View  Result Date:  09/20/2017 CLINICAL DATA:  Intermittent chest pain and shortness of breath. Took Klonopin today. EXAM: CHEST  2 VIEW COMPARISON:  Chest radiograph August 02, 2017 FINDINGS: Cardiomediastinal silhouette is normal. No pleural effusions or focal consolidations. Trachea projects midline and there is no pneumothorax. Soft tissue planes and included osseous structures are non-suspicious. IMPRESSION: Negative. Electronically Signed   By: Awilda Metroourtnay  Bloomer M.D.   On: 09/20/2017 14:36

## 2017-09-25 NOTE — ED Notes (Signed)
Pt reports smoking and asthma and anxiety hx, reports some drinks tonight and 1 x vomiting and coughing, URI congestion for the last 2 weeks

## 2017-09-25 NOTE — ED Provider Notes (Signed)
[facility] Emergency Department Provider Note  ____________________________________________   First MD Initiated Contact with Patient 09/25/17 0025     (approximate)  I have reviewed the triage vital signs and the nursing notes.   HISTORY  Chief Complaint Cough and Back Pain    HPI Douglas Singh is a 37 y.o. male who self presents to the emergency department with 2 weeks of intermittent cough shortness of breath and chest pain.  His symptoms initially began with upper respiratory tract symptoms along with low-grade fever rhinorrhea and cough.  The fever and rhinorrhea have gone away but the cough and sharp left-sided chest pain worse with deep inspiration mild to moderate severity have persisted.  He has taken no medications and nothing seems to help.  Past Medical History:  Diagnosis Date  . Anxiety   . Asthma   . Panic attacks   . Reflux     Patient Active Problem List   Diagnosis Date Noted  . Panic attack 12/03/2015  . Anxiety 06/16/2014  . Acid reflux 06/16/2014    Past Surgical History:  Procedure Laterality Date  . URETHRA SURGERY  2000?   stricture?    Prior to Admission medications   Medication Sig Start Date End Date Taking? Authorizing Provider  albuterol (PROVENTIL HFA;VENTOLIN HFA) 108 (90 Base) MCG/ACT inhaler Inhale 2 puffs into the lungs every 6 (six) hours as needed for wheezing or shortness of breath. 09/12/17  Yes Bridget Hartshorn L, PA-C  benzonatate (TESSALON PERLES) 100 MG capsule Take 2 capsules (200 mg total) by mouth 3 (three) times daily as needed. 09/12/17 09/12/18 Yes Summers, Rhonda L, PA-C  clonazePAM (KLONOPIN) 1 MG tablet Take 1 tablet (1 mg total) by mouth 2 (two) times daily as needed for up to 12 days for anxiety. 09/12/17 09/24/17 Yes Summers, Rhonda L, PA-C  ibuprofen (ADVIL,MOTRIN) 600 MG tablet Take 1 tablet (600 mg total) by mouth every 8 (eight) hours as needed. 09/25/17   Merrily Brittle, MD     Allergies Codeine  Family History  Problem Relation Age of Onset  . Bladder Cancer Neg Hx   . Kidney cancer Neg Hx   . Prostate cancer Neg Hx     Social History Social History   Tobacco Use  . Smoking status: Current Every Day Smoker    Packs/day: 0.50    Types: Cigarettes  . Smokeless tobacco: Never Used  Substance Use Topics  . Alcohol use: Yes    Comment: socially on weekends  . Drug use: No    Review of Systems Constitutional: No fever/chills ENT: No sore throat. Cardiovascular: Positive for chest pain. Respiratory: Positive for shortness of breath. Gastrointestinal: No abdominal pain.  No nausea, no vomiting.  No diarrhea.  No constipation. Musculoskeletal: Negative for back pain. Neurological: Negative for headaches   ____________________________________________   PHYSICAL EXAM:  VITAL SIGNS: ED Triage Vitals  Enc Vitals Group     BP 09/24/17 2238 (!) 146/90     Pulse Rate 09/24/17 2238 97     Resp 09/24/17 2238 18     Temp 09/24/17 2238 98.6 F (37 C)     Temp Source 09/24/17 2238 Oral     SpO2 09/24/17 2238 98 %     Weight 09/24/17 2241 210 lb (95.3 kg)     Height 09/24/17 2241 5\' 8"  (1.727 m)     Head Circumference --      Peak Flow --      Pain Score 09/24/17 2239  7     Pain Loc --      Pain Edu? --      Excl. in GC? --     Constitutional: Alert and oriented x4 well-appearing nontoxic no diaphoresis speaks full clear sentences Head: Atraumatic. Nose: No congestion/rhinnorhea. Mouth/Throat: No trismus Neck: No stridor.   Cardiovascular: Regular rate and rhythm Respiratory: Normal respiratory effort.  No retractions. Gastrointestinal: Soft nontender Neurologic:  Normal speech and language. No gross focal neurologic deficits are appreciated.  Skin:  Skin is warm, dry and intact. No rash noted.    ____________________________________________  LABS (all labs ordered are listed, but only abnormal results are displayed)  Labs  Reviewed - No data to display   __________________________________________  EKG   ____________________________________________  RADIOLOGY   ____________________________________________   DIFFERENTIAL includes but not limited to  Post viral syndrome, post viral cough, pneumonia, bronchitis, influenza   PROCEDURES  Procedure(s) performed: no  Procedures  Critical Care performed: no  Observation: no ____________________________________________   INITIAL IMPRESSION / ASSESSMENT AND PLAN / ED COURSE  Pertinent labs & imaging results that were available during my care of the patient were reviewed by me and considered in my medical decision making (see chart for details).  The patient arrives hemodynamically stable and very well-appearing.  I had a lengthy discussion with him regarding post viral cough and post viral syndrome and I have encouraged him to take ibuprofen for his pleuritic chest pain.  He verbalizes understanding and agreement the plan.  He is discharged home in good condition.  Strict return precautions have been given.      ____________________________________________   FINAL CLINICAL IMPRESSION(S) / ED DIAGNOSES  Final diagnoses:  Post viral syndrome      NEW MEDICATIONS STARTED DURING THIS VISIT:  Discharge Medication List as of 09/25/2017  1:01 AM    START taking these medications   Details  ibuprofen (ADVIL,MOTRIN) 600 MG tablet Take 1 tablet (600 mg total) by mouth every 8 (eight) hours as needed., Starting Mon 09/25/2017, Print         Note:  This document was prepared using Dragon voice recognition software and may include unintentional dictation errors.      Merrily Brittleifenbark, Arnie Clingenpeel, MD 09/25/17 [id number]

## 2017-10-05 ENCOUNTER — Emergency Department: Admission: EM | Admit: 2017-10-05 | Discharge: 2017-10-05 | Discharge status: Against Medical Advice | Payer: Self-pay

## 2017-10-05 NOTE — ED Notes (Signed)
Patient called to be triaged x2, no response noted at this time from patient.  Staff checked restrooms, lobby, and outdoor facility.   

## 2017-10-05 NOTE — ED Notes (Signed)
Patient called to be triaged x1, no response noted at this time from patient.  Staff checked restrooms, lobby, and outdoor facility.   

## 2017-10-05 NOTE — ED Notes (Signed)
Patient called to be triaged x3, no response noted at this time from patient.  Staff checked restrooms, lobby, and outdoor facility.   

## 2017-11-05 ENCOUNTER — Emergency Department
Admission: EM | Admit: 2017-11-05 | Discharge: 2017-11-05 | Disposition: A | Discharge status: Home / Self Care | Payer: Self-pay | Attending: Emergency Medicine | Admitting: Emergency Medicine

## 2017-11-05 ENCOUNTER — Encounter: Payer: Self-pay | Admitting: Emergency Medicine

## 2017-11-05 ENCOUNTER — Other Ambulatory Visit: Payer: Self-pay

## 2017-11-05 DIAGNOSIS — F419 Anxiety disorder, unspecified: Secondary | ICD-10-CM | POA: Insufficient documentation

## 2017-11-05 DIAGNOSIS — Z5321 Procedure and treatment not carried out due to patient leaving prior to being seen by health care provider: Secondary | ICD-10-CM | POA: Insufficient documentation

## 2017-11-05 NOTE — ED Notes (Signed)
Pt called from the [facility] with no response. 

## 2017-11-05 NOTE — ED Triage Notes (Signed)
Pt arrives ambulatory to triage with c/o feeling "panicky". Pt reports that he makes sure to take his anxiety medication x 12 hours after alcohol. Pt is in NAD.

## 2017-11-15 ENCOUNTER — Other Ambulatory Visit: Payer: Self-pay

## 2017-11-15 ENCOUNTER — Encounter: Payer: Self-pay | Admitting: Emergency Medicine

## 2017-11-15 ENCOUNTER — Emergency Department
Admission: EM | Admit: 2017-11-15 | Discharge: 2017-11-15 | Disposition: A | Discharge status: Home / Self Care | Payer: Self-pay | Attending: Student in an Organized Health Care Education/Training Program | Admitting: Student in an Organized Health Care Education/Training Program

## 2017-11-15 DIAGNOSIS — J45909 Unspecified asthma, uncomplicated: Secondary | ICD-10-CM | POA: Insufficient documentation

## 2017-11-15 DIAGNOSIS — F17210 Nicotine dependence, cigarettes, uncomplicated: Secondary | ICD-10-CM | POA: Insufficient documentation

## 2017-11-15 DIAGNOSIS — R1013 Epigastric pain: Secondary | ICD-10-CM | POA: Insufficient documentation

## 2017-11-15 LAB — COMPREHENSIVE METABOLIC PANEL
ALBUMIN: 4.5 g/dL (ref 3.5–5.0)
ALK PHOS: 58 U/L (ref 38–126)
ALT: 29 U/L (ref 17–63)
ANION GAP: 14 (ref 5–15)
AST: 31 U/L (ref 15–41)
BILIRUBIN TOTAL: 2.2 mg/dL — AB (ref 0.3–1.2)
BUN: 10 mg/dL (ref 6–20)
CALCIUM: 9.4 mg/dL (ref 8.9–10.3)
CO2: 24 mmol/L (ref 22–32)
CREATININE: 0.77 mg/dL (ref 0.61–1.24)
Chloride: 97 mmol/L — ABNORMAL LOW (ref 101–111)
GFR calc Af Amer: >60 mL/min (ref >60)
GFR calc non Af Amer: >60 mL/min (ref >60)
GLUCOSE: 107 mg/dL — AB (ref 65–99)
Potassium: 3.4 mmol/L — ABNORMAL LOW (ref 3.5–5.1)
Sodium: 135 mmol/L (ref 135–145)
TOTAL PROTEIN: 7.9 g/dL (ref 6.5–8.1)

## 2017-11-15 LAB — LIPASE, BLOOD: Lipase: 34 U/L (ref 11–51)

## 2017-11-15 LAB — TROPONIN I

## 2017-11-15 LAB — CBC
HCT: 44 % (ref 40.0–52.0)
HEMOGLOBIN: 15 g/dL (ref 13.0–18.0)
MCH: 30.1 pg (ref 26.0–34.0)
MCHC: 34.2 g/dL (ref 32.0–36.0)
MCV: 88 fL (ref 80.0–100.0)
PLATELETS: 239 10*3/uL (ref 150–440)
RBC: 4.99 MIL/uL (ref 4.40–5.90)
RDW: 14.3 % (ref 11.5–14.5)
WBC: 8.5 10*3/uL (ref 3.8–10.6)

## 2017-11-15 MED ORDER — GI COCKTAIL ~~LOC~~
30.0000 mL | Freq: Once | ORAL | Status: AC
  Administered 2017-11-15: 30 mL via ORAL

## 2017-11-15 MED ORDER — PROMETHAZINE HCL 12.5 MG PO TABS: 12.5000 mg | ORAL_TABLET | Freq: Four times a day (QID) | ORAL | 0 refills | Status: DC | PRN

## 2017-11-15 MED ORDER — RANITIDINE HCL 150 MG PO TABS: 150.0000 mg | ORAL_TABLET | Freq: Two times a day (BID) | ORAL | 1 refills | Status: DC

## 2017-11-15 MED ORDER — GI COCKTAIL ~~LOC~~
ORAL | Status: AC
  Filled 2017-11-15: qty 30

## 2017-11-15 NOTE — ED Provider Notes (Signed)
[facility] Emergency Department Provider Note    None    (approximate)  I have reviewed the triage vital signs and the nursing notes.   HISTORY  Chief Complaint Abdominal Pain    HPI Douglas Singh is a 37 y.o. male anxiety, panic attacks, asthma as well as acid reflux intermittently takes Zantac presents to the ER for chief complaint of mild intermittent epigastric discomfort and gassy p.m. feeling.  States he has been drinking heavily for the past 3 days.  Does smoke intermittently.  Denies any pain when taking a deep breath.  No bloody vomiting.  No sudden onset tearing discomfort.  Says the pain is nonmigrating.  Does not radiate through to his back.  Has never had pain quite like this before.  Has not been taking his Klonopin for his anxiety.  Past Medical History:  Diagnosis Date  . Anxiety   . Asthma   . Panic attacks   . Reflux    Family History  Problem Relation Age of Onset  . Bladder Cancer Neg Hx   . Kidney cancer Neg Hx   . Prostate cancer Neg Hx    Past Surgical History:  Procedure Laterality Date  . URETHRA SURGERY  2000?   stricture?   Patient Active Problem List   Diagnosis Date Noted  . Panic attack 12/03/2015  . Anxiety 06/16/2014  . Acid reflux 06/16/2014      Prior to Admission medications   Medication Sig Start Date End Date Taking? Authorizing Provider  albuterol (PROVENTIL HFA;VENTOLIN HFA) 108 (90 Base) MCG/ACT inhaler Inhale 2 puffs into the lungs every 6 (six) hours as needed for wheezing or shortness of breath. 09/12/17   Tommi RumpsSummers, Rhonda L, PA-C  benzonatate (TESSALON PERLES) 100 MG capsule Take 2 capsules (200 mg total) by mouth 3 (three) times daily as needed. 09/12/17 09/12/18  Tommi RumpsSummers, Rhonda L, PA-C  clonazePAM (KLONOPIN) 1 MG tablet Take 1 tablet (1 mg total) by mouth 2 (two) times daily as needed for up to 12 days for anxiety. 09/12/17 09/24/17  Tommi RumpsSummers, Rhonda L, PA-C  ibuprofen (ADVIL,MOTRIN) 600 MG tablet  Take 1 tablet (600 mg total) by mouth every 8 (eight) hours as needed. 09/25/17   Merrily Brittleifenbark, Neil, MD  promethazine (PHENERGAN) 12.5 MG tablet Take 1 tablet (12.5 mg total) by mouth every 6 (six) hours as needed for nausea or vomiting. 11/15/17   Willy Eddyobinson, Shavone Nevers, MD  ranitidine (ZANTAC) 150 MG tablet Take 1 tablet (150 mg total) by mouth 2 (two) times daily. 11/15/17 11/15/18  Willy Eddyobinson, Josiel Gahm, MD    Allergies Codeine    Social History Social History   Tobacco Use  . Smoking status: Current Every Day Smoker    Packs/day: 0.50    Types: Cigarettes  . Smokeless tobacco: Never Used  Substance Use Topics  . Alcohol use: Yes    Comment: socially on weekends  . Drug use: No    Review of Systems Patient denies headaches, rhinorrhea, blurry vision, numbness, shortness of breath, chest pain, edema, cough, abdominal pain, nausea, vomiting, diarrhea, dysuria, fevers, rashes or hallucinations unless otherwise stated above in HPI. ____________________________________________   PHYSICAL EXAM:  VITAL SIGNS: Vitals:   11/15/17 1402 11/15/17 1613  BP: (!) 157/83 130/78  Pulse: 72 80  Resp: 16 17  Temp: 99.1 F (37.3 C)   SpO2: 97% 100%    Constitutional: Alert and oriented. Well appearing and in no acute distress. Eyes: Conjunctivae are normal.  Head: Atraumatic. Nose: No congestion/rhinnorhea.  Mouth/Throat: Mucous membranes are moist.   Neck: No stridor. Painless ROM.  Cardiovascular: Normal rate, regular rhythm. Grossly normal heart sounds.  Good peripheral circulation. Respiratory: Normal respiratory effort.  No retractions. Lungs CTAB. Gastrointestinal: Soft and nontender. No distention. No abdominal bruits. No CVA tenderness. Genitourinary:  Musculoskeletal: No lower extremity tenderness nor edema.  No joint effusions. Neurologic:  Normal speech and language. No gross focal neurologic deficits are appreciated. No facial droop Skin:  Skin is warm, dry and intact. No rash  noted. Psychiatric: Mood and affect are normal. Speech and behavior are normal.  ____________________________________________   LABS (all labs ordered are listed, but only abnormal results are displayed)  Results for orders placed or performed during the hospital encounter of 11/15/17 (from the past 24 hour(s))  Lipase, blood     Status: None   Collection Time: 11/15/17  2:10 PM  Result Value Ref Range   Lipase 34 11 - 51 U/L  Comprehensive metabolic panel     Status: Abnormal   Collection Time: 11/15/17  2:10 PM  Result Value Ref Range   Sodium 135 135 - 145 mmol/L   Potassium 3.4 (L) 3.5 - 5.1 mmol/L   Chloride 97 (L) 101 - 111 mmol/L   CO2 24 22 - 32 mmol/L   Glucose, Bld 107 (H) 65 - 99 mg/dL   BUN 10 6 - 20 mg/dL   Creatinine, Ser 0.98 0.61 - 1.24 mg/dL   Calcium 9.4 8.9 - 11.9 mg/dL   Total Protein 7.9 6.5 - 8.1 g/dL   Albumin 4.5 3.5 - 5.0 g/dL   AST 31 15 - 41 U/L   ALT 29 17 - 63 U/L   Alkaline Phosphatase 58 38 - 126 U/L   Total Bilirubin 2.2 (H) 0.3 - 1.2 mg/dL   GFR calc non Af Amer >60 >60 mL/min   GFR calc Af Amer >60 >60 mL/min   Anion gap 14 5 - 15  CBC     Status: None   Collection Time: 11/15/17  2:10 PM  Result Value Ref Range   WBC 8.5 3.8 - 10.6 K/uL   RBC 4.99 4.40 - 5.90 MIL/uL   Hemoglobin 15.0 13.0 - 18.0 g/dL   HCT 14.7 82.9 - 56.2 %   MCV 88.0 80.0 - 100.0 fL   MCH 30.1 26.0 - 34.0 pg   MCHC 34.2 32.0 - 36.0 g/dL   RDW 13.0 86.5 - 78.4 %   Platelets 239 150 - 440 K/uL  Troponin I     Status: None   Collection Time: 11/15/17  2:10 PM  Result Value Ref Range   Troponin I <0.03 <0.03 ng/mL   ____________________________________________  EKG My review and personal interpretation at Time: 14:01   Indication: epigastric pain  Rate: 80  Rhythm: sinus Axis: normal Other: normal intervals, no stemi, ____________________________________________  RADIOLOGY   ____________________________________________   PROCEDURES  Procedure(s)  performed:  Procedures    Critical Care performed: no ____________________________________________   INITIAL IMPRESSION / ASSESSMENT AND PLAN / ED COURSE  Pertinent labs & imaging results that were available during my care of the patient were reviewed by me and considered in my medical decision making (see chart for details).  DDX: Gastritis, esophagitis, reflux, ACS, pericarditis, anxiety, withdrawal  Douglas Singh is a 37 y.o. who presents to the ED with with a history of alcohol use presents with chief complaint of epigastric discomfort gassy in nature not improved with Gas-X.  No evidence of acute ischemia and  symptoms not clinically consistent with ACS or pericarditis.  Will trial GI cocktail.  Blood work is otherwise reassuring.  Clinical Course as of Nov 15 1648  Wed Nov 15, 2017  1646 Patient reassessed with improvement in symptoms.  Repeat abdominal exam is soft and benign.  Patient tolerating oral hydration.  At this point believe patient stable and appropriate for outpatient follow-up.  [PR]    Clinical Course User Index [PR] Willy Eddy, MD     As part of my medical decision making, I reviewed the following data within the electronic MEDICAL RECORD NUMBER Nursing notes reviewed and incorporated, Labs reviewed, notes from prior ED visits and [place] Controlled Substance Database   ____________________________________________   FINAL CLINICAL IMPRESSION(S) / ED DIAGNOSES  Final diagnoses:  Epigastric pain      NEW MEDICATIONS STARTED DURING THIS VISIT:  New Prescriptions   PROMETHAZINE (PHENERGAN) 12.5 MG TABLET    Take 1 tablet (12.5 mg total) by mouth every 6 (six) hours as needed for nausea or vomiting.   RANITIDINE (ZANTAC) 150 MG TABLET    Take 1 tablet (150 mg total) by mouth 2 (two) times daily.     Note:  This document was prepared using Dragon voice recognition software and may include unintentional dictation errors.    Willy Eddy, MD 11/15/17  1650

## 2017-11-15 NOTE — ED Triage Notes (Signed)
Pt in via POV with complaints with epigastric pain x one day.  Pt reports feeling bloated, taking gas-x with out any relief.  NAD noted at this time.

## 2017-11-15 NOTE — Discharge Instructions (Signed)

## 2017-11-15 NOTE — ED Notes (Signed)
Patient ambulatory to lobby with steady gait and NAD noted. Verbalized understanding of discharge instructions and follow-up care.  

## 2017-11-19 ENCOUNTER — Emergency Department
Admission: EM | Admit: 2017-11-19 | Discharge: 2017-11-19 | Disposition: A | Discharge status: Home / Self Care | Payer: Self-pay | Attending: Emergency Medicine | Admitting: Emergency Medicine

## 2017-11-19 DIAGNOSIS — R040 Epistaxis: Secondary | ICD-10-CM | POA: Insufficient documentation

## 2017-11-19 DIAGNOSIS — J45909 Unspecified asthma, uncomplicated: Secondary | ICD-10-CM | POA: Insufficient documentation

## 2017-11-19 DIAGNOSIS — F17210 Nicotine dependence, cigarettes, uncomplicated: Secondary | ICD-10-CM | POA: Insufficient documentation

## 2017-11-19 MED ORDER — OXYMETAZOLINE HCL 0.05 % NA SOLN: 2.0000 | Freq: Two times a day (BID) | NASAL | 2 refills | Status: DC | PRN

## 2017-11-19 NOTE — ED Triage Notes (Signed)
Pt presents c/o epistaxis since apx 1500. Pt denies recent URI symptoms. Denies hx of same. Bleeding controlled at this time.

## 2017-11-19 NOTE — ED Provider Notes (Signed)
[facility] Emergency Department Provider Note  ____________________________________________  Time seen: Approximately 5:04 PM  I have reviewed the triage vital signs and the nursing notes.   HISTORY  Chief Complaint Epistaxis    HPI Douglas Singh is a 37 y.o. male who presents the emergency department complaining of epistaxis.  Patient reports that he had 2 episodes today.  They were both easily controlled.  Patient does not have a history of nosebleeds and with the second episode he became concerned and presented to the emergency department.  No bleeding or clotting disorders.  No direct trauma to the nose.  Patient reports that he had a URI approximately a week ago but denies any nasal congestion, sneezing, coughing at this time.  Patient has not take any medications for this complaint.  No history of allergic rhinitis or chronic sinusitis.  No other complaints.  Past Medical History:  Diagnosis Date  . Anxiety   . Asthma   . Panic attacks   . Reflux     Patient Active Problem List   Diagnosis Date Noted  . Panic attack 12/03/2015  . Anxiety 06/16/2014  . Acid reflux 06/16/2014    Past Surgical History:  Procedure Laterality Date  . URETHRA SURGERY  2000?   stricture?    Prior to Admission medications   Medication Sig Start Date End Date Taking? Authorizing Provider  albuterol (PROVENTIL HFA;VENTOLIN HFA) 108 (90 Base) MCG/ACT inhaler Inhale 2 puffs into the lungs every 6 (six) hours as needed for wheezing or shortness of breath. 09/12/17   Tommi RumpsSummers, Rhonda L, PA-C  benzonatate (TESSALON PERLES) 100 MG capsule Take 2 capsules (200 mg total) by mouth 3 (three) times daily as needed. 09/12/17 09/12/18  Tommi RumpsSummers, Rhonda L, PA-C  clonazePAM (KLONOPIN) 1 MG tablet Take 1 tablet (1 mg total) by mouth 2 (two) times daily as needed for up to 12 days for anxiety. 09/12/17 09/24/17  Tommi RumpsSummers, Rhonda L, PA-C  ibuprofen (ADVIL,MOTRIN) 600 MG tablet Take 1 tablet (600  mg total) by mouth every 8 (eight) hours as needed. 09/25/17   Merrily Brittleifenbark, Neil, MD  oxymetazoline (AFRIN) 0.05 % nasal spray Place 2 sprays into both nostrils 2 (two) times daily as needed (epistaxis). 11/19/17   Cuthriell, Delorise RoyalsJonathan D, PA-C  promethazine (PHENERGAN) 12.5 MG tablet Take 1 tablet (12.5 mg total) by mouth every 6 (six) hours as needed for nausea or vomiting. 11/15/17   Willy Eddyobinson, Patrick, MD  ranitidine (ZANTAC) 150 MG tablet Take 1 tablet (150 mg total) by mouth 2 (two) times daily. 11/15/17 11/15/18  Willy Eddyobinson, Patrick, MD    Allergies Codeine  Family History  Problem Relation Age of Onset  . Bladder Cancer Neg Hx   . Kidney cancer Neg Hx   . Prostate cancer Neg Hx     Social History Social History   Tobacco Use  . Smoking status: Current Every Day Smoker    Packs/day: 0.50    Types: Cigarettes  . Smokeless tobacco: Never Used  Substance Use Topics  . Alcohol use: Yes    Comment: socially on weekends  . Drug use: No     Review of Systems  Constitutional: No fever/chills Eyes: No visual changes. No discharge ENT: Positive for epistaxis Cardiovascular: no chest pain. Respiratory: no cough. No SOB. Gastrointestinal: No abdominal pain.  No nausea, no vomiting.  No diarrhea.  No constipation. Musculoskeletal: Negative for musculoskeletal pain. Skin: Negative for rash, abrasions, lacerations, ecchymosis. Neurological: Negative for headaches, focal weakness or numbness. 10-point ROS  otherwise negative.  ____________________________________________   PHYSICAL EXAM:  VITAL SIGNS: ED Triage Vitals  Enc Vitals Group     BP 11/19/17 1517 130/85     Pulse Rate 11/19/17 1517 97     Resp 11/19/17 1517 17     Temp 11/19/17 1517 (!) 97.5 F (36.4 C)     Temp Source 11/19/17 1517 Oral     SpO2 11/19/17 1517 96 %     Weight 11/19/17 1516 210 lb (95.3 kg)     Height 11/19/17 1516 5\' 8"  (1.727 m)     Head Circumference --      Peak Flow --      Pain Score 11/19/17  1522 0     Pain Loc --      Pain Edu? --      Excl. in GC? --      Constitutional: Alert and oriented. Well appearing and in no acute distress. Eyes: Conjunctivae are normal. PERRL. EOMI. Head: Atraumatic. ENT:      Ears:       Nose: No congestion/rhinnorhea.  Examination of the nares reveals clotting of the Kiesselbach plexus right nares.  No active bleeding.  Turbinates are unremarkable.  Patient is nontender to percussion over the sinuses.      Mouth/Throat: Mucous membranes are moist.  Neck: No stridor.   Hematological/Lymphatic/Immunilogical: No cervical lymphadenopathy. Cardiovascular: Normal rate, regular rhythm. Normal S1 and S2.  Good peripheral circulation. Respiratory: Normal respiratory effort without tachypnea or retractions. Lungs CTAB. Good air entry to the bases with no decreased or absent breath sounds. Musculoskeletal: Full range of motion to all extremities. No gross deformities appreciated. Neurologic:  Normal speech and language. No gross focal neurologic deficits are appreciated.  Skin:  Skin is warm, dry and intact. No rash noted. Psychiatric: Mood and affect are normal. Speech and behavior are normal. Patient exhibits appropriate insight and judgement.   ____________________________________________   LABS (all labs ordered are listed, but only abnormal results are displayed)  Labs Reviewed - No data to display ____________________________________________  EKG   ____________________________________________  RADIOLOGY   No results found.  ____________________________________________    PROCEDURES  Procedure(s) performed:    Procedures    Medications - No data to display   ____________________________________________   INITIAL IMPRESSION / ASSESSMENT AND PLAN / ED COURSE  Pertinent labs & imaging results that were available during my care of the patient were reviewed by me and considered in my medical decision making (see chart for  details).  Review of the [place] CSRS was performed in accordance of the [facility] prior to dispensing any controlled drugs.     Patient's diagnosis is consistent with epistaxis.  Patient presents with 2 episodes of epistaxis today.  Both are easily controlled with direct pressure.  Exam is reassuring.  At this time, there is no indication for labs or imaging.  Patient has a nasal clip provided to him should this return.  He will also be prescribed Afrin should this return.  No indication for Rhino Rocket at this time..  If symptoms do not improve, patient will follow up with ENT.  Patient is given ED precautions to return to the ED for any worsening or new symptoms.     ____________________________________________  FINAL CLINICAL IMPRESSION(S) / ED DIAGNOSES  Final diagnoses:  Epistaxis      NEW MEDICATIONS STARTED DURING THIS VISIT:  ED Discharge Orders        Ordered    oxymetazoline (AFRIN) 0.05 % nasal  spray  2 times daily PRN     11/19/17 1703          This chart was dictated using voice recognition software/Dragon. Despite best efforts to proofread, errors can occur which can change the meaning. Any change was purely unintentional.    Racheal Patches, PA-C 11/19/17 1706    Arnaldo Natal, MD 11/20/17 [phone]

## 2017-11-29 ENCOUNTER — Emergency Department
Admission: EM | Admit: 2017-11-29 | Discharge: 2017-11-29 | Disposition: A | Discharge status: Home / Self Care | Payer: Self-pay | Attending: Emergency Medicine | Admitting: Emergency Medicine

## 2017-11-29 ENCOUNTER — Other Ambulatory Visit: Payer: Self-pay

## 2017-11-29 DIAGNOSIS — K2100 Gastro-esophageal reflux disease with esophagitis, without bleeding: Secondary | ICD-10-CM

## 2017-11-29 DIAGNOSIS — J45909 Unspecified asthma, uncomplicated: Secondary | ICD-10-CM | POA: Insufficient documentation

## 2017-11-29 DIAGNOSIS — Z79899 Other long term (current) drug therapy: Secondary | ICD-10-CM | POA: Insufficient documentation

## 2017-11-29 DIAGNOSIS — F17210 Nicotine dependence, cigarettes, uncomplicated: Secondary | ICD-10-CM | POA: Insufficient documentation

## 2017-11-29 DIAGNOSIS — K2950 Unspecified chronic gastritis without bleeding: Secondary | ICD-10-CM | POA: Insufficient documentation

## 2017-11-29 LAB — COMPREHENSIVE METABOLIC PANEL
ALBUMIN: 4.4 g/dL (ref 3.5–5.0)
ALK PHOS: 56 U/L (ref 38–126)
ALT: 30 U/L (ref 17–63)
ANION GAP: 11 (ref 5–15)
AST: 34 U/L (ref 15–41)
BILIRUBIN TOTAL: 0.8 mg/dL (ref 0.3–1.2)
BUN: 9 mg/dL (ref 6–20)
CALCIUM: 9.3 mg/dL (ref 8.9–10.3)
CO2: 28 mmol/L (ref 22–32)
Chloride: 102 mmol/L (ref 101–111)
Creatinine, Ser: 0.76 mg/dL (ref 0.61–1.24)
GFR calc Af Amer: >60 mL/min (ref >60)
Glucose, Bld: 109 mg/dL — ABNORMAL HIGH (ref 65–99)
Potassium: 3.4 mmol/L — ABNORMAL LOW (ref 3.5–5.1)
Sodium: 141 mmol/L (ref 135–145)
TOTAL PROTEIN: 8.1 g/dL (ref 6.5–8.1)

## 2017-11-29 LAB — TROPONIN I

## 2017-11-29 LAB — CBC
HEMATOCRIT: 44.7 % (ref 40.0–52.0)
HEMOGLOBIN: 15.1 g/dL (ref 13.0–18.0)
MCH: 29.7 pg (ref 26.0–34.0)
MCHC: 33.9 g/dL (ref 32.0–36.0)
MCV: 87.6 fL (ref 80.0–100.0)
Platelets: 275 10*3/uL (ref 150–440)
RBC: 5.1 MIL/uL (ref 4.40–5.90)
RDW: 14.3 % (ref 11.5–14.5)
WBC: 6.8 10*3/uL (ref 3.8–10.6)

## 2017-11-29 MED ORDER — SUCRALFATE 1 G PO TABS
1.0000 g | ORAL_TABLET | Freq: Once | ORAL | Status: AC
  Administered 2017-11-29: 1 g via ORAL
  Filled 2017-11-29: qty 1

## 2017-11-29 MED ORDER — GI COCKTAIL ~~LOC~~
30.0000 mL | Freq: Once | ORAL | Status: AC
  Administered 2017-11-29: 30 mL via ORAL
  Filled 2017-11-29: qty 30

## 2017-11-29 MED ORDER — METOCLOPRAMIDE HCL 10 MG PO TABS: 10.0000 mg | ORAL_TABLET | Freq: Three times a day (TID) | ORAL | 0 refills | Status: DC | PRN

## 2017-11-29 MED ORDER — SUCRALFATE 1 G PO TABS: 1.0000 g | ORAL_TABLET | Freq: Two times a day (BID) | ORAL | 0 refills | Status: DC

## 2017-11-29 NOTE — Discharge Instructions (Addendum)
Please follow-up with GI.  Please adhere to the food choices for reflux guidelines.

## 2017-11-29 NOTE — ED Provider Notes (Signed)
Home  [facility] Emergency Department Provider Note   ____________________________________________   First MD Initiated Contact with Patient 11/29/17 [phone]     (approximate)  I have reviewed the triage vital signs and the nursing notes.   HISTORY  Chief Complaint Gastroesophageal Reflux    HPI Douglas Singh is a 37 y.o. male who comes into the hospital today with some epigastric pain and chest burning.  He reports that he was sleeping and he woke up with burning in his chest.  He reports that he vomited twice.  He also had some shortness of breath.  The patient uses inhaler and the shortness of breath got better.  The patient reports that he does have reflux and his reflux has been acting up lately.  He takes Zantac but only when needed.  The patient took a Zantac tonight but it did not help.  The patient rates his pain a 2 out of 10 in intensity currently.  The patient had chicken wings and meatballs for dinner and he reports that they were mildly spicy.  The patient also had some lemonade today.  The patient states that his throat is a little scratchy after vomiting.  He is here for evaluation.   Past Medical History:  Diagnosis Date  . Anxiety   . Asthma   . Panic attacks   . Reflux     Patient Active Problem List   Diagnosis Date Noted  . Panic attack 12/03/2015  . Anxiety 06/16/2014  . Acid reflux 06/16/2014    Past Surgical History:  Procedure Laterality Date  . URETHRA SURGERY  2000?   stricture?    Prior to Admission medications   Medication Sig Start Date End Date Taking? Authorizing Provider  albuterol (PROVENTIL HFA;VENTOLIN HFA) 108 (90 Base) MCG/ACT inhaler Inhale 2 puffs into the lungs every 6 (six) hours as needed for wheezing or shortness of breath. 09/12/17   Tommi Rumps, PA-C  benzonatate (TESSALON PERLES) 100 MG capsule Take 2 capsules (200 mg total) by mouth 3 (three) times daily as needed. 09/12/17 09/12/18  Tommi Rumps, PA-C  clonazePAM (KLONOPIN) 1 MG tablet Take 1 tablet (1 mg total) by mouth 2 (two) times daily as needed for up to 12 days for anxiety. 09/12/17 09/24/17  Tommi Rumps, PA-C  ibuprofen (ADVIL,MOTRIN) 600 MG tablet Take 1 tablet (600 mg total) by mouth every 8 (eight) hours as needed. 09/25/17   Merrily Brittle, MD  metoCLOPramide (REGLAN) 10 MG tablet Take 1 tablet (10 mg total) by mouth every 8 (eight) hours as needed. 11/29/17   Rebecka Apley, MD  oxymetazoline (AFRIN) 0.05 % nasal spray Place 2 sprays into both nostrils 2 (two) times daily as needed (epistaxis). 11/19/17   Cuthriell, Delorise Royals, PA-C  promethazine (PHENERGAN) 12.5 MG tablet Take 1 tablet (12.5 mg total) by mouth every 6 (six) hours as needed for nausea or vomiting. 11/15/17   Willy Eddy, MD  ranitidine (ZANTAC) 150 MG tablet Take 1 tablet (150 mg total) by mouth 2 (two) times daily. 11/15/17 11/15/18  Willy Eddy, MD  sucralfate (CARAFATE) 1 g tablet Take 1 tablet (1 g total) by mouth 2 (two) times daily. 11/29/17   Rebecka Apley, MD    Allergies Codeine  Family History  Problem Relation Age of Onset  . Bladder Cancer Neg Hx   . Kidney cancer Neg Hx   . Prostate cancer Neg Hx     Social History Social History  Tobacco Use  . Smoking status: Current Every Day Smoker    Packs/day: 0.50    Types: Cigarettes  . Smokeless tobacco: Never Used  Substance Use Topics  . Alcohol use: Yes    Comment: socially on weekends  . Drug use: No    Review of Systems  Constitutional: No fever/chills Eyes: No visual changes. ENT: No sore throat. Cardiovascular:  chest pain. Respiratory:  shortness of breath. Gastrointestinal:  abdominal pain.  No nausea, no vomiting.  Genitourinary: Negative for dysuria. Musculoskeletal: Negative for back pain. Skin: Negative for rash. Neurological: Negative for headaches   ____________________________________________   PHYSICAL EXAM:  VITAL SIGNS: ED Triage  Vitals  Enc Vitals Group     BP 11/29/17 0259 (!) 182/91     Pulse Rate 11/29/17 0259 96     Resp 11/29/17 0259 20     Temp 11/29/17 0259 98.3 F (36.8 C)     Temp Source 11/29/17 0259 Oral     SpO2 11/29/17 0259 98 %     Weight 11/29/17 0300 230 lb (104.3 kg)     Height 11/29/17 0300 5\' 8"  (1.727 m)     Head Circumference --      Peak Flow --      Pain Score 11/29/17 0300 5     Pain Loc --      Pain Edu? --      Excl. in GC? --     Constitutional: Alert and oriented. Well appearing and in mild distress. Eyes: Conjunctivae are normal. PERRL. EOMI. Head: Atraumatic. Nose: No congestion/rhinnorhea. Mouth/Throat: Mucous membranes are moist.  Oropharynx non-erythematous. Cardiovascular: Normal rate, regular rhythm. Grossly normal heart sounds.  Good peripheral circulation. Respiratory: Normal respiratory effort.  No retractions. Lungs CTAB. Gastrointestinal: Soft with some mild epigastric and left upper quadrant tenderness to palpation. No distention.  Positive bowel sounds Musculoskeletal: No lower extremity tenderness nor edema.   Neurologic:  Normal speech and language.  Skin:  Skin is warm, dry and intact.  Psychiatric: Mood and affect are normal.   ____________________________________________   LABS (all labs ordered are listed, but only abnormal results are displayed)  Labs Reviewed  COMPREHENSIVE METABOLIC PANEL - Abnormal; Notable for the following components:      Result Value   Potassium 3.4 (*)    Glucose, Bld 109 (*)    All other components within normal limits  CBC  TROPONIN I   ____________________________________________  EKG  ED ECG REPORT I, Rebecka ApleyWebster,  Allison P, the attending physician, personally viewed and interpreted this ECG.   Date: 11/29/2017  EKG Time: 303  Rate: 87  Rhythm: normal sinus rhythm  Axis: normal  Intervals:none  ST&T Change: none  ____________________________________________  RADIOLOGY  ED MD interpretation:   none  Official radiology report(s): No results found.  ____________________________________________   PROCEDURES  Procedure(s) performed: None  Procedures  Critical Care performed: No  ____________________________________________   INITIAL IMPRESSION / ASSESSMENT AND PLAN / ED COURSE  As part of my medical decision making, I reviewed the following data within the electronic MEDICAL RECORD NUMBER Notes from prior ED visits and [place] Controlled Substance Database   This is a 37 year old male who is well-known to the emergency department who comes into the hospital today with some epigastric pain and chest burning.  The patient does have a history of reflux and has been seen multiple times in the emergency department for reflux symptoms.  I did give the patient a GI cocktail and some Carafate and  his pain improved to the 2 from what it had been previously.  I did have a conversation with the patient about appropriate foods to manage his reflux and taking his medication daily for the next 2 weeks.  The patient also should follow-up with GI for further evaluation of epigastric pain.  He will be discharged home.      ____________________________________________   FINAL CLINICAL IMPRESSION(S) / ED DIAGNOSES  Final diagnoses:  Other chronic gastritis without hemorrhage  Gastroesophageal reflux disease with esophagitis     ED Discharge Orders        Ordered    metoCLOPramide (REGLAN) 10 MG tablet  Every 8 hours PRN     11/29/17 0459    sucralfate (CARAFATE) 1 g tablet  2 times daily     11/29/17 0459       Note:  This document was prepared using Dragon voice recognition software and may include unintentional dictation errors.    Rebecka Apley, MD 11/29/17 [phone]

## 2017-11-29 NOTE — ED Triage Notes (Signed)
Pt in with co chest burning, states has hx of reflux. Vomited x 2 this am, states took zantac without relief.

## 2017-12-24 ENCOUNTER — Emergency Department
Admission: EM | Admit: 2017-12-24 | Discharge: 2017-12-25 | Discharge status: Against Medical Advice | Payer: Self-pay | Attending: Emergency Medicine | Admitting: Emergency Medicine

## 2017-12-24 DIAGNOSIS — Z5321 Procedure and treatment not carried out due to patient leaving prior to being seen by health care provider: Secondary | ICD-10-CM | POA: Insufficient documentation

## 2017-12-24 DIAGNOSIS — H9209 Otalgia, unspecified ear: Secondary | ICD-10-CM | POA: Insufficient documentation

## 2017-12-24 DIAGNOSIS — R51 Headache: Secondary | ICD-10-CM | POA: Insufficient documentation

## 2017-12-25 ENCOUNTER — Other Ambulatory Visit: Payer: Self-pay

## 2017-12-25 ENCOUNTER — Emergency Department: Payer: Self-pay

## 2017-12-25 ENCOUNTER — Encounter: Payer: Self-pay | Admitting: Emergency Medicine

## 2017-12-25 ENCOUNTER — Emergency Department
Admission: EM | Admit: 2017-12-25 | Discharge: 2017-12-25 | Disposition: A | Discharge status: Home / Self Care | Payer: Self-pay | Attending: Emergency Medicine | Admitting: Emergency Medicine

## 2017-12-25 DIAGNOSIS — J45909 Unspecified asthma, uncomplicated: Secondary | ICD-10-CM | POA: Insufficient documentation

## 2017-12-25 DIAGNOSIS — X58XXXA Exposure to other specified factors, initial encounter: Secondary | ICD-10-CM | POA: Insufficient documentation

## 2017-12-25 DIAGNOSIS — Y929 Unspecified place or not applicable: Secondary | ICD-10-CM | POA: Insufficient documentation

## 2017-12-25 DIAGNOSIS — R51 Headache: Secondary | ICD-10-CM

## 2017-12-25 DIAGNOSIS — F17210 Nicotine dependence, cigarettes, uncomplicated: Secondary | ICD-10-CM | POA: Insufficient documentation

## 2017-12-25 DIAGNOSIS — Y999 Unspecified external cause status: Secondary | ICD-10-CM | POA: Insufficient documentation

## 2017-12-25 DIAGNOSIS — J0110 Acute frontal sinusitis, unspecified: Secondary | ICD-10-CM | POA: Insufficient documentation

## 2017-12-25 DIAGNOSIS — Z79899 Other long term (current) drug therapy: Secondary | ICD-10-CM | POA: Insufficient documentation

## 2017-12-25 DIAGNOSIS — S29012A Strain of muscle and tendon of back wall of thorax, initial encounter: Secondary | ICD-10-CM | POA: Insufficient documentation

## 2017-12-25 DIAGNOSIS — Y939 Activity, unspecified: Secondary | ICD-10-CM | POA: Insufficient documentation

## 2017-12-25 DIAGNOSIS — R519 Headache, unspecified: Secondary | ICD-10-CM

## 2017-12-25 DIAGNOSIS — T148XXA Other injury of unspecified body region, initial encounter: Secondary | ICD-10-CM

## 2017-12-25 MED ORDER — KETOROLAC TROMETHAMINE 30 MG/ML IJ SOLN
30.0000 mg | Freq: Once | INTRAMUSCULAR | Status: AC
  Administered 2017-12-25: 30 mg via INTRAVENOUS
  Filled 2017-12-25: qty 1

## 2017-12-25 MED ORDER — KETOROLAC TROMETHAMINE 10 MG PO TABS: 10.0000 mg | ORAL_TABLET | Freq: Four times a day (QID) | ORAL | 0 refills | Status: DC | PRN

## 2017-12-25 MED ORDER — CYCLOBENZAPRINE HCL 10 MG PO TABS
10.0000 mg | ORAL_TABLET | Freq: Once | ORAL | Status: AC
  Administered 2017-12-25: 10 mg via ORAL
  Filled 2017-12-25: qty 1

## 2017-12-25 MED ORDER — SODIUM CHLORIDE 0.9 % IV BOLUS
500.0000 mL | Freq: Once | INTRAVENOUS | Status: AC
Start: 2017-12-25 — End: 2017-12-25
  Administered 2017-12-25: 500 mL via INTRAVENOUS

## 2017-12-25 MED ORDER — AMOXICILLIN-POT CLAVULANATE 875-125 MG PO TABS: 1.0000 | ORAL_TABLET | Freq: Two times a day (BID) | ORAL | 0 refills | Status: AC

## 2017-12-25 MED ORDER — LIDOCAINE 5 % EX PTCH
1.0000 | MEDICATED_PATCH | CUTANEOUS | Status: DC
  Administered 2017-12-25: 1 via TRANSDERMAL
  Filled 2017-12-25: qty 1

## 2017-12-25 MED ORDER — CYCLOBENZAPRINE HCL 5 MG PO TABS: ORAL_TABLET | ORAL | 0 refills | Status: DC

## 2017-12-25 MED ORDER — LIDOCAINE 5 % EX PTCH: 1.0000 | MEDICATED_PATCH | CUTANEOUS | 0 refills | Status: DC

## 2017-12-25 MED ORDER — PROMETHAZINE HCL 25 MG/ML IJ SOLN
12.5000 mg | Freq: Once | INTRAMUSCULAR | Status: AC
  Administered 2017-12-25: 12.5 mg via INTRAVENOUS
  Filled 2017-12-25: qty 1

## 2017-12-25 NOTE — ED Provider Notes (Signed)
[facility] Emergency Department Provider Note  ____________________________________________  Time seen: Approximately 10:06 AM  I have reviewed the triage vital signs and the nursing notes.   HISTORY  Chief Complaint neck pain    HPI Douglas Singh is a 37 y.o. male that presents to the emergency department for evaluation of headache and upper back pain for 1 week. Headache is over left temple. It hurts when he presses on the area. It can last the whole day. It eases up with ibuprofen but then returns. He also has right upper back pain that feels like it has a "crook" in it. Pain does not radiate. Patient was seen at urgent care at [facility] for his sinuses and was given ear drops and flonase. He still has some mild nasal congestion but it seems to be improving. He has felt cold but has not checked his temperature. No visual changes, photphobia, sore throat, cough, CP, SOB, nausea, vomiting.   Past Medical History:  Diagnosis Date  . Anxiety   . Asthma   . Panic attacks   . Reflux     Patient Active Problem List   Diagnosis Date Noted  . Panic attack 12/03/2015  . Anxiety 06/16/2014  . Acid reflux 06/16/2014    Past Surgical History:  Procedure Laterality Date  . URETHRA SURGERY  2000?   stricture?    Prior to Admission medications   Medication Sig Start Date End Date Taking? Authorizing Provider  albuterol (PROVENTIL HFA;VENTOLIN HFA) 108 (90 Base) MCG/ACT inhaler Inhale 2 puffs into the lungs every 6 (six) hours as needed for wheezing or shortness of breath. 09/12/17   Tommi Rumps, PA-C  amoxicillin-clavulanate (AUGMENTIN) 875-125 MG tablet Take 1 tablet by mouth 2 (two) times daily for 10 days. 12/25/17 01/04/18  Enid Derry, PA-C  benzonatate (TESSALON PERLES) 100 MG capsule Take 2 capsules (200 mg total) by mouth 3 (three) times daily as needed. 09/12/17 09/12/18  Tommi Rumps, PA-C  clonazePAM (KLONOPIN) 1 MG tablet Take 1 tablet (1  mg total) by mouth 2 (two) times daily as needed for up to 12 days for anxiety. 09/12/17 09/24/17  Tommi Rumps, PA-C  cyclobenzaprine (FLEXERIL) 5 MG tablet Take 1-2 tablets 3 times daily as needed 12/25/17   Enid Derry, PA-C  ibuprofen (ADVIL,MOTRIN) 600 MG tablet Take 1 tablet (600 mg total) by mouth every 8 (eight) hours as needed. 09/25/17   Merrily Brittle, MD  ketorolac (TORADOL) 10 MG tablet Take 1 tablet (10 mg total) by mouth every 6 (six) hours as needed. 12/25/17   Enid Derry, PA-C  lidocaine (LIDODERM) 5 % Place 1 patch onto the skin daily. Remove & Discard patch within 12 hours or as directed by MD 12/25/17   Enid Derry, PA-C  metoCLOPramide (REGLAN) 10 MG tablet Take 1 tablet (10 mg total) by mouth every 8 (eight) hours as needed. 11/29/17   Rebecka Apley, MD  oxymetazoline (AFRIN) 0.05 % nasal spray Place 2 sprays into both nostrils 2 (two) times daily as needed (epistaxis). 11/19/17   Cuthriell, Delorise Royals, PA-C  promethazine (PHENERGAN) 12.5 MG tablet Take 1 tablet (12.5 mg total) by mouth every 6 (six) hours as needed for nausea or vomiting. 11/15/17   Willy Eddy, MD  ranitidine (ZANTAC) 150 MG tablet Take 1 tablet (150 mg total) by mouth 2 (two) times daily. 11/15/17 11/15/18  Willy Eddy, MD  sucralfate (CARAFATE) 1 g tablet Take 1 tablet (1 g total) by mouth 2 (two)  times daily. 11/29/17   Rebecka Apley, MD    Allergies Codeine  Family History  Problem Relation Age of Onset  . Bladder Cancer Neg Hx   . Kidney cancer Neg Hx   . Prostate cancer Neg Hx     Social History Social History   Tobacco Use  . Smoking status: Current Every Day Smoker    Packs/day: 0.50    Types: Cigarettes  . Smokeless tobacco: Never Used  Substance Use Topics  . Alcohol use: Yes    Comment: socially on weekends  . Drug use: No     Review of Systems  ENT: Positive for congestion.  Cardiovascular: No chest pain. Respiratory: No cough. No  SOB. Gastrointestinal: No abdominal pain.  No nausea, no vomiting.  Musculoskeletal: Positive for back pain.  Skin: Negative for rash, abrasions, lacerations, ecchymosis. Neurological: Negative for numbness or tingling. Positive for headache.    ____________________________________________   PHYSICAL EXAM:  VITAL SIGNS: ED Triage Vitals [12/25/17 0738]  Enc Vitals Group     BP (!) 144/87     Pulse Rate 93     Resp 16     Temp 98 F (36.7 C)     Temp Source Oral     SpO2 97 %     Weight 230 lb (104.3 kg)     Height  (1.727 m)     Head Circumference      Peak Flow      Pain Score 9     Pain Loc      Pain Edu?      Excl. in GC?      Constitutional: Alert and oriented. Well appearing and in no acute distress. Eyes: Conjunctivae are normal. PERRL. EOMI. Head: Atraumatic. ENT: Tenderness to palpation over left frontal sinus.      Ears: Tympanic membranes are pearly.      Nose: Mild congestion/rhinnorhea.      Mouth/Throat: Mucous membranes are moist.  Oropharynx nonerythematous.  Tonsils not enlarged. Neck: No stridor.   Cardiovascular: Normal rate, regular rhythm.  Good peripheral circulation. Respiratory: Normal respiratory effort without tachypnea or retractions. Lungs CTAB. Good air entry to the bases with no decreased or absent breath sounds. Musculoskeletal: Full range of motion to all extremities. No gross deformities appreciated. Neurologic:  Normal speech and language. No gross focal neurologic deficits are appreciated. Strength and sensation equal in upper and lower extremities bilaterally.  Skin:  Skin is warm, dry and intact. No rash noted. Psychiatric: Mood and affect are normal. Speech and behavior are normal. Patient exhibits appropriate insight and judgement.   ____________________________________________   LABS (all labs ordered are listed, but only abnormal results are displayed)  Labs Reviewed - No data to  display ____________________________________________  EKG   ____________________________________________  RADIOLOGY Lexine Baton, personally viewed and evaluated these images (plain radiographs) as part of my medical decision making, as well as reviewing the written report by the radiologist.  Ct Head Wo Contrast  Result Date: 12/25/2017 CLINICAL DATA:  Left side headache, right posterior auricular pain. EXAM: CT HEAD WITHOUT CONTRAST TECHNIQUE: Contiguous axial images were obtained from the base of the skull through the vertex without intravenous contrast. COMPARISON:  07/27/2014 FINDINGS: Brain: No intracranial abnormality. No hemorrhage, hydrocephalus, mass lesion, midline shift or acute infarction. Vascular: No hyperdense vessel or unexpected calcification. Skull: No acute calvarial abnormality. Sinuses/Orbits: Minimal mucosal thickening in the left ethmoid air cells. No air-fluid levels. Mastoid air cells are clear. Other: Small  radiodense calcification or foreign body again noted in the left forehead soft tissues, unchanged since 2015. IMPRESSION: No intracranial abnormality. Minimal left ethmoid chronic sinusitis. Electronically Signed   By: Charlett Nose M.D.   On: 12/25/2017 10:38    ____________________________________________    PROCEDURES  Procedure(s) performed:    Procedures    Medications  cyclobenzaprine (FLEXERIL) tablet 10 mg (has no administration in time range)  lidocaine (LIDODERM) 5 % 1 patch (has no administration in time range)  ketorolac (TORADOL) 30 MG/ML injection 30 mg (30 mg Intravenous Given 12/25/17 1001)  sodium chloride 0.9 % bolus 500 mL (0 mLs Intravenous Stopped 12/25/17 1127)  promethazine (PHENERGAN) injection 12.5 mg (12.5 mg Intravenous Given 12/25/17 1001)     ____________________________________________   INITIAL IMPRESSION / ASSESSMENT AND PLAN / ED COURSE  Pertinent labs & imaging results that were available during my care of the  patient were reviewed by me and considered in my medical decision making (see chart for details).  Review of the [place] CSRS was performed in accordance of the [facility] prior to dispensing any controlled drugs.     Patient's diagnosis is consistent with sinusitis and muscle strain.  Vital signs and exam are reassuring.  CT head shows minimal ethmoid sinusitis.  Symptoms improved with fluids, Toradol, Phenergan.  Patient will be discharged home with prescriptions for Augmentin, Toradol, Flexeril. Patient is to follow up with PCP as directed. Patient is given ED precautions to return to the ED for any worsening or new symptoms.     ____________________________________________  FINAL CLINICAL IMPRESSION(S) / ED DIAGNOSES  Final diagnoses:  Acute non-recurrent frontal sinusitis  Acute intractable headache, unspecified headache type  Muscle strain      NEW MEDICATIONS STARTED DURING THIS VISIT:  ED Discharge Orders        Ordered    cyclobenzaprine (FLEXERIL) 5 MG tablet     12/25/17 1124    ketorolac (TORADOL) 10 MG tablet  Every 6 hours PRN     12/25/17 1124    amoxicillin-clavulanate (AUGMENTIN) 875-125 MG tablet  2 times daily     12/25/17 1124    lidocaine (LIDODERM) 5 %  Every 24 hours     12/25/17 1124          This chart was dictated using voice recognition software/Dragon. Despite best efforts to proofread, errors can occur which can change the meaning. Any change was purely unintentional.    Enid Derry, PA-C 12/25/17 1143    Don Perking, [place], MD 12/27/17 [phone]

## 2017-12-25 NOTE — ED Notes (Signed)
See triage note  Presents with pain to behind right ear into neck and upper back  Also has been having headache   Mainly behind left eye  Subjective fever

## 2017-12-25 NOTE — ED Triage Notes (Signed)
Pt reports ear pain and headache since last weekend, pt was prescribed antibiotic ear drops at that time, states that he has been using them as well as a nasal spray and zyrtec. Pt denies relief, states that he feels hot and states that this left side of his forehead feels soft

## 2017-12-25 NOTE — ED Triage Notes (Signed)
Here for neck and upper back pain since for couple weeks.  Pt reports not sure if has to do with abx ear drops was given couple weeks ago.  Hurts worse when moving. Here last night but didn't want to wait so came back this morning.  NAD. VSS

## 2018-01-10 ENCOUNTER — Emergency Department
Admission: EM | Admit: 2018-01-10 | Discharge: 2018-01-10 | Disposition: A | Discharge status: Home / Self Care | Payer: Self-pay | Attending: Emergency Medicine | Admitting: Emergency Medicine

## 2018-01-10 ENCOUNTER — Other Ambulatory Visit: Payer: Self-pay

## 2018-01-10 ENCOUNTER — Emergency Department: Payer: Self-pay

## 2018-01-10 DIAGNOSIS — F17210 Nicotine dependence, cigarettes, uncomplicated: Secondary | ICD-10-CM | POA: Insufficient documentation

## 2018-01-10 DIAGNOSIS — K209 Esophagitis, unspecified without bleeding: Secondary | ICD-10-CM

## 2018-01-10 DIAGNOSIS — Z79899 Other long term (current) drug therapy: Secondary | ICD-10-CM | POA: Insufficient documentation

## 2018-01-10 DIAGNOSIS — F419 Anxiety disorder, unspecified: Secondary | ICD-10-CM | POA: Insufficient documentation

## 2018-01-10 DIAGNOSIS — J45909 Unspecified asthma, uncomplicated: Secondary | ICD-10-CM | POA: Insufficient documentation

## 2018-01-10 DIAGNOSIS — R0602 Shortness of breath: Secondary | ICD-10-CM | POA: Insufficient documentation

## 2018-01-10 DIAGNOSIS — R252 Cramp and spasm: Secondary | ICD-10-CM | POA: Insufficient documentation

## 2018-01-10 MED ORDER — LORAZEPAM 0.5 MG PO TABS
0.5000 mg | ORAL_TABLET | Freq: Once | ORAL | Status: AC
  Administered 2018-01-10: 0.5 mg via ORAL
  Filled 2018-01-10: qty 1

## 2018-01-10 MED ORDER — CLONAZEPAM 1 MG PO TABS: 1.0000 mg | ORAL_TABLET | Freq: Two times a day (BID) | ORAL | 0 refills | Status: DC | PRN

## 2018-01-10 MED ORDER — GI COCKTAIL ~~LOC~~
30.0000 mL | Freq: Once | ORAL | Status: AC
  Administered 2018-01-10: 30 mL via ORAL
  Filled 2018-01-10: qty 30

## 2018-01-10 NOTE — ED Triage Notes (Signed)
Pt reporting spasms to left side of neck with deep breathing. Started today. No other complaints. RR even and unlabored.

## 2018-01-10 NOTE — ED Notes (Signed)
See triage note  States he developed some SOB this afternoon  Describes as "spasms" in chest  Afebrile on arrival   resp even and non labored

## 2018-01-10 NOTE — ED Notes (Signed)
Pt ambulatory to POV without difficulty. VSS. NAD. Discharge instructions, RX and follow up reviewed. All questions reviewed.

## 2018-01-10 NOTE — ED Provider Notes (Signed)
[facility] Emergency Department Provider Note  ____________________________________________   First MD Initiated Contact with Patient 01/10/18 1605     (approximate)  I have reviewed the triage vital signs and the nursing notes.   HISTORY  Chief Complaint Spasms    HPI Douglas Singh is a 37 y.o. male presents emergency department complaining of spasms in his chest.  He states he felt some shortness of breath.  The spasms started after eating a ranch-style chicken sandwich from [employer].  He denies any chest pain.  He states it is more of a spasm that goes in the center of his chest up his neck.  He thinks it is giving him trouble breathing or either his anxiety has just gone through the roof.  He has been out of his Klonopin for 3 days.  He denies feeling SI/HI.    Past Medical History:  Diagnosis Date  . Anxiety   . Asthma   . Panic attacks   . Reflux     Patient Active Problem List   Diagnosis Date Noted  . Panic attack 12/03/2015  . Anxiety 06/16/2014  . Acid reflux 06/16/2014    Past Surgical History:  Procedure Laterality Date  . URETHRA SURGERY  2000?   stricture?    Prior to Admission medications   Medication Sig Start Date End Date Taking? Authorizing Provider  albuterol (PROVENTIL HFA;VENTOLIN HFA) 108 (90 Base) MCG/ACT inhaler Inhale 2 puffs into the lungs every 6 (six) hours as needed for wheezing or shortness of breath. 09/12/17   Tommi Rumps, PA-C  clonazePAM (KLONOPIN) 1 MG tablet Take 1 tablet (1 mg total) by mouth 2 (two) times daily as needed for up to 12 days for anxiety. 01/10/18 01/22/18  Fisher, Roselyn Bering, PA-C  metoCLOPramide (REGLAN) 10 MG tablet Take 1 tablet (10 mg total) by mouth every 8 (eight) hours as needed. 11/29/17   Rebecka Apley, MD  promethazine (PHENERGAN) 12.5 MG tablet Take 1 tablet (12.5 mg total) by mouth every 6 (six) hours as needed for nausea or vomiting. 11/15/17   Willy Eddy, MD  ranitidine  (ZANTAC) 150 MG tablet Take 1 tablet (150 mg total) by mouth 2 (two) times daily. 11/15/17 11/15/18  Willy Eddy, MD    Allergies Codeine  Family History  Problem Relation Age of Onset  . Bladder Cancer Neg Hx   . Kidney cancer Neg Hx   . Prostate cancer Neg Hx     Social History Social History   Tobacco Use  . Smoking status: Current Every Day Smoker    Packs/day: 0.50    Types: Cigarettes  . Smokeless tobacco: Never Used  Substance Use Topics  . Alcohol use: Yes    Comment: socially on weekends  . Drug use: No    Review of Systems  Constitutional: No fever/chills Eyes: No visual changes. ENT: No sore throat. Respiratory: Denies cough, positive for shortness of breath Gastrointestinal: Positive for spasms that radiate from the center of the chest up into the neck Genitourinary: Negative for dysuria. Musculoskeletal: Negative for back pain. Skin: Negative for rash. Psychiatric: States he has a lot of panic attacks and is in the middle of one right now.    ____________________________________________   PHYSICAL EXAM:  VITAL SIGNS: ED Triage Vitals  Enc Vitals Group     BP 01/10/18 1554 (!) 144/88     Pulse Rate 01/10/18 1554 84     Resp 01/10/18 1554 20     Temp  01/10/18 1554 98.6 F (37 C)     Temp Source 01/10/18 1554 Oral     SpO2 01/10/18 1554 98 %     Weight 01/10/18 1555 230 lb (104.3 kg)     Height 01/10/18 1555  (1.727 m)     Head Circumference --      Peak Flow --      Pain Score 01/10/18 1554 8     Pain Loc --      Pain Edu? --      Excl. in GC? --     Constitutional: Alert and oriented. Well appearing and in no acute distress. Eyes: Conjunctivae are normal.  Head: Atraumatic. Nose: No congestion/rhinnorhea. Mouth/Throat: Mucous membranes are moist.   Neck: Is supple, no lymphadenopathy is noted.  There is no tenderness of the neck and no muscle spasms noted. Cardiovascular: Normal rate, regular rhythm.  Heart sounds are  normal Respiratory: Normal respiratory effort.  No retractions, lungs are clear to auscultation Abdomen: Soft nontender bowel sounds normal GU: deferred Musculoskeletal: FROM all extremities, warm and well perfused Neurologic:  Normal speech and language.  Skin:  Skin is warm, dry and intact. No rash noted. Psychiatric: Mood and affect are normal. Speech and behavior are normal.  ____________________________________________   LABS (all labs ordered are listed, but only abnormal results are displayed)  Labs Reviewed - No data to display ____________________________________________   ____________________________________________  RADIOLOGY  Chest x-ray is normal  ____________________________________________   PROCEDURES  Procedure(s) performed: Ativan 0.5 mg, GI cocktail, patient had relief with this  Procedures    ____________________________________________   INITIAL IMPRESSION / ASSESSMENT AND PLAN / ED COURSE  Pertinent labs & imaging results that were available during my care of the patient were reviewed by me and considered in my medical decision making (see chart for details).  Patient is 37 year old male presents emergency department in a panic attack and complaining of spasms in the mid chest radiating up to into the left side of the neck.  He denies chest pain.  States he does feel he is having trouble breathing but thinks this is from his anxiety.  He ran out of Klonopin 3 days ago.  On physical exam patient appears very well.  He is sweating and anxious.  The remainder the exam is benign.  EKG is normal.  Chest x-ray is negative.  Patient was given Ativan 0.5 mg and a GI cocktail.  He states his symptoms are relieved by these.  Plan exam findings and the fact that he had relief with these medications.  He is to follow-up with his regular doctor.  Explained to him in detail that we cannot issue more than 10 Klonopin to him.  He will have to find his regular  doctor and have him re-issue another prescription.  Explained to him if he returns to the emergency department requesting more Klonopin he will not get this prescription.  He states he understands.  He is also to take over-the-counter Tums or omeprazole for any kind of esophageal spasms.  He was instructed to change his diet to a healthier diet to prevent this esophageal spasm.  He was discharged in stable condition     As part of my medical decision making, I reviewed the following data within the electronic MEDICAL RECORD NUMBER Nursing notes reviewed and incorporated, EKG interpreted NSR, Old chart reviewed, Radiograph reviewed chest x-ray is normal, Notes from prior ED visits and  Controlled Substance Database  ____________________________________________   FINAL  CLINICAL IMPRESSION(S) / ED DIAGNOSES  Final diagnoses:  Anxiety  Esophagitis      NEW MEDICATIONS STARTED DURING THIS VISIT:  Current Discharge Medication List       Note:  This document was prepared using Dragon voice recognition software and may include unintentional dictation errors.    Faythe Ghee, PA-C 01/10/18 1806    Minna Antis, MD 01/10/18 2042

## 2018-01-10 NOTE — Discharge Instructions (Addendum)
Follow-up with your regular doctor or the acute care if not better in 2 to 3 days.  Return to emergency department if worsening.  You have been given 1 prescription of Klonopin.  We will not be able to reissue another prescription for this.  Due to the [facility] in the [facility] medical board's restrictions one prescription only can be provided for chronic medications.  If you develop the esophagitis she can take over-the-counter omeprazole or Tums.

## 2018-01-10 NOTE — ED Provider Notes (Signed)
 -----------------------------------------   5:34 PM on 01/10/2018 -----------------------------------------  EKG interpreted by me Normal sinus rhythm rate of 68, normal axis intervals QRS ST segments and T waves.   Sharman Cheek, MD 01/10/18 1734

## 2018-03-07 ENCOUNTER — Emergency Department
Admission: EM | Admit: 2018-03-07 | Discharge: 2018-03-07 | Disposition: A | Discharge status: Home / Self Care | Payer: Self-pay | Attending: Emergency Medicine | Admitting: Emergency Medicine

## 2018-03-07 ENCOUNTER — Other Ambulatory Visit: Payer: Self-pay

## 2018-03-07 DIAGNOSIS — J45901 Unspecified asthma with (acute) exacerbation: Secondary | ICD-10-CM | POA: Insufficient documentation

## 2018-03-07 DIAGNOSIS — Z760 Encounter for issue of repeat prescription: Secondary | ICD-10-CM | POA: Insufficient documentation

## 2018-03-07 DIAGNOSIS — F17210 Nicotine dependence, cigarettes, uncomplicated: Secondary | ICD-10-CM | POA: Insufficient documentation

## 2018-03-07 DIAGNOSIS — Z79899 Other long term (current) drug therapy: Secondary | ICD-10-CM | POA: Insufficient documentation

## 2018-03-07 DIAGNOSIS — F419 Anxiety disorder, unspecified: Secondary | ICD-10-CM | POA: Insufficient documentation

## 2018-03-07 MED ORDER — CLONAZEPAM 1 MG PO TBDP
1.0000 mg | ORAL_TABLET | Freq: Two times a day (BID) | ORAL | Status: DC
  Administered 2018-03-07: 1 mg via ORAL

## 2018-03-07 MED ORDER — ALBUTEROL SULFATE HFA 108 (90 BASE) MCG/ACT IN AERS: 2.0000 | INHALATION_SPRAY | Freq: Four times a day (QID) | RESPIRATORY_TRACT | 0 refills | Status: DC | PRN

## 2018-03-07 MED ORDER — CLONAZEPAM 1 MG PO TABS: 1.0000 mg | ORAL_TABLET | ORAL | 0 refills | Status: AC | PRN

## 2018-03-07 MED ORDER — IPRATROPIUM-ALBUTEROL 0.5-2.5 (3) MG/3ML IN SOLN
3.0000 mL | Freq: Once | RESPIRATORY_TRACT | Status: AC
Start: 2018-03-07 — End: 2018-03-07
  Administered 2018-03-07: 3 mL via RESPIRATORY_TRACT
  Filled 2018-03-07: qty 3

## 2018-03-07 MED ORDER — CLONAZEPAM 0.5 MG PO TBDP
ORAL_TABLET | ORAL | Status: AC
  Administered 2018-03-07: 1 mg via ORAL
  Filled 2018-03-07: qty 2

## 2018-03-07 NOTE — ED Notes (Signed)
Pt ambulatory to lobby without difficulty. Pt reports that he feels so much better. VSS. NAD. Discharge instructions, RX, follow up. All questions answered.

## 2018-03-07 NOTE — ED Provider Notes (Signed)
[facility] Emergency Department Provider Note  ____________________________________________  Time seen: Approximately 8:23 AM  I have reviewed the triage vital signs and the nursing notes.   HISTORY  Chief Complaint Asthma and Medication Refill    HPI Douglas Singh is a 37 y.o. male that presents to emergency department for medication refill.  Patient states that he felt short of breath this morning and was out of his albuterol inhaler.  The shortness of breath, made him feel anxious.  He ran out of his clonazepam 3 days ago.  [facility] about 6 months ago.  The next closest facility is in [place].  He has not sure where else to go.  He states that his shortness of breath and anxious feeling resolved after coming to the emergency department.   Past Medical History:  Diagnosis Date  . Anxiety   . Asthma   . Panic attacks   . Reflux     Patient Active Problem List   Diagnosis Date Noted  . Panic attack 12/03/2015  . Anxiety 06/16/2014  . Acid reflux 06/16/2014    Past Surgical History:  Procedure Laterality Date  . URETHRA SURGERY  2000?   stricture?    Prior to Admission medications   Medication Sig Start Date End Date Taking? Authorizing Provider  albuterol (PROVENTIL HFA;VENTOLIN HFA) 108 (90 Base) MCG/ACT inhaler Inhale 2 puffs into the lungs every 6 (six) hours as needed for wheezing or shortness of breath. 03/07/18   Enid Derry, PA-C  clonazePAM (KLONOPIN) 1 MG tablet Take 1 tablet (1 mg total) by mouth as needed for up to 5 days for anxiety. 03/07/18 03/12/18  Enid Derry, PA-C  metoCLOPramide (REGLAN) 10 MG tablet Take 1 tablet (10 mg total) by mouth every 8 (eight) hours as needed. 11/29/17   Rebecka Apley, MD  promethazine (PHENERGAN) 12.5 MG tablet Take 1 tablet (12.5 mg total) by mouth every 6 (six) hours as needed for nausea or vomiting. 11/15/17   Willy Eddy, MD   ranitidine (ZANTAC) 150 MG tablet Take 1 tablet (150 mg total) by mouth 2 (two) times daily. 11/15/17 11/15/18  Willy Eddy, MD    Allergies Codeine  Family History  Problem Relation Age of Onset  . Bladder Cancer Neg Hx   . Kidney cancer Neg Hx   . Prostate cancer Neg Hx     Social History Social History   Tobacco Use  . Smoking status: Current Every Day Smoker    Packs/day: 0.50    Types: Cigarettes  . Smokeless tobacco: Never Used  Substance Use Topics  . Alcohol use: Yes    Comment: socially on weekends  . Drug use: No     Review of Systems  Constitutional: No fever/chills Cardiovascular: No chest pain. Respiratory: No cough.  Gastrointestinal: No nausea, no vomiting.  Musculoskeletal: Negative for musculoskeletal pain. Skin: Negative for rash, abrasions, lacerations, ecchymosis. Neurological: Negative for headaches   ____________________________________________   PHYSICAL EXAM:  VITAL SIGNS: ED Triage Vitals [03/07/18 0820]  Enc Vitals Group     BP (!) 153/95     Pulse Rate 77     Resp 18     Temp 98.2 F (36.8 C)     Temp Source Oral     SpO2 100 %     Weight 213 lb (96.6 kg)     Height 5\' 8"  (1.727 m)     Head Circumference  Peak Flow      Pain Score 0     Pain Loc      Pain Edu?      Excl. in GC?      Constitutional: Alert and oriented. Well appearing and in no acute distress. Eyes: Conjunctivae are normal. PERRL. EOMI. Head: Atraumatic. ENT:      Ears:      Nose: No congestion/rhinnorhea.      Mouth/Throat: Mucous membranes are moist.  Neck: No stridor.  Cardiovascular: Normal rate, regular rhythm.  Good peripheral circulation. Respiratory: Normal respiratory effort without tachypnea or retractions. Lungs CTAB. Good air entry to the bases with no decreased or absent breath sounds. Musculoskeletal: Full range of motion to all extremities. No gross deformities appreciated. Neurologic:  Normal speech and language. No gross  focal neurologic deficits are appreciated.  Skin:  Skin is warm, dry and intact. No rash noted.   ____________________________________________   LABS (all labs ordered are listed, but only abnormal results are displayed)  Labs Reviewed - No data to display ____________________________________________  EKG   ____________________________________________  RADIOLOGY  No results found.  ____________________________________________    PROCEDURES  Procedure(s) performed:    Procedures    Medications  clonazePAM (KLONOPIN) disintegrating tablet 1 mg (1 mg Oral Given 03/07/18 1003)  ipratropium-albuterol (DUONEB) 0.5-2.5 (3) MG/3ML nebulizer solution 3 mL (3 mLs Nebulization Given 03/07/18 0938)     ____________________________________________   INITIAL IMPRESSION / ASSESSMENT AND PLAN / ED COURSE  Pertinent labs & imaging results that were available during my care of the patient were reviewed by me and considered in my medical decision making (see chart for details).  Review of the [place] CSRS was performed in accordance of the [facility] prior to dispensing any controlled drugs.     Patient presented to the emergency department for evaluation of medication refill.  Vital signs and exam are reassuring.  Patient is asymptomatic now.  He was given a breathing treatment in ED.  We had an extensive discussion on medication refill in the emergency department.  He was not sure where else to make an appointment so resources were provided.  Patient is agreeable to call [facility] today to get established.  Patient will be discharged home with prescriptions for albuterol and a short course of clonazepam. Patient is to follow up with PCP as directed. Patient is given ED precautions to return to the ED for any worsening or new symptoms.     ____________________________________________  FINAL CLINICAL IMPRESSION(S) / ED DIAGNOSES  Final diagnoses:  Medication refill  Anxiety  Exacerbation of  asthma, unspecified asthma severity, unspecified whether persistent      NEW MEDICATIONS STARTED DURING THIS VISIT:  ED Discharge Orders        Ordered    clonazePAM (KLONOPIN) 1 MG tablet  As needed     03/07/18 1030    albuterol (PROVENTIL HFA;VENTOLIN HFA) 108 (90 Base) MCG/ACT inhaler  Every 6 hours PRN     03/07/18 1030          This chart was dictated using voice recognition software/Dragon. Despite best efforts to proofread, errors can occur which can change the meaning. Any change was purely unintentional.    Enid DerryWagner, Maurina Fawaz, PA-C 03/07/18 1247    Sharman CheekStafford, Phillip, MD 03/09/18 [phone]

## 2018-03-07 NOTE — ED Triage Notes (Signed)
Pt states that he felt SOB this AM because he is out of his albuterol and also is out of his "panic attack" medications. Pt states he feels mildly anxious, does not appear anxious. Pt alert and oriented X4, active, cooperative, pt in NAD. RR even and unlabored, color WNL.  States SOB has almost resolved.

## 2018-03-07 NOTE — ED Notes (Signed)
Pt reports he awoke this am and felt short of breath and he is out of his albuterol. Pt reports also felt a little anxious and is out of clonazepam 0.5mg .  Pt states he feels like he is starting to get withdrawal symptoms from not having them. States that he has been taking them for 4 years.

## 2018-04-05 ENCOUNTER — Other Ambulatory Visit: Payer: Self-pay

## 2018-04-05 ENCOUNTER — Emergency Department
Admission: EM | Admit: 2018-04-05 | Discharge: 2018-04-05 | Disposition: A | Discharge status: Home / Self Care | Payer: Self-pay | Attending: Emergency Medicine | Admitting: Emergency Medicine

## 2018-04-05 ENCOUNTER — Encounter: Payer: Self-pay | Admitting: Emergency Medicine

## 2018-04-05 DIAGNOSIS — F17210 Nicotine dependence, cigarettes, uncomplicated: Secondary | ICD-10-CM | POA: Insufficient documentation

## 2018-04-05 DIAGNOSIS — J45909 Unspecified asthma, uncomplicated: Secondary | ICD-10-CM | POA: Insufficient documentation

## 2018-04-05 DIAGNOSIS — Z79899 Other long term (current) drug therapy: Secondary | ICD-10-CM | POA: Insufficient documentation

## 2018-04-05 DIAGNOSIS — M5441 Lumbago with sciatica, right side: Secondary | ICD-10-CM | POA: Insufficient documentation

## 2018-04-05 MED ORDER — PREDNISONE 20 MG PO TABS
60.0000 mg | ORAL_TABLET | ORAL | Status: AC
  Administered 2018-04-05: 60 mg via ORAL
  Filled 2018-04-05: qty 3

## 2018-04-05 MED ORDER — IBUPROFEN 600 MG PO TABS
600.0000 mg | ORAL_TABLET | Freq: Once | ORAL | Status: AC
  Administered 2018-04-05: 600 mg via ORAL
  Filled 2018-04-05: qty 1

## 2018-04-05 MED ORDER — GABAPENTIN 100 MG PO CAPS
100.0000 mg | ORAL_CAPSULE | ORAL | Status: AC
  Administered 2018-04-05: 100 mg via ORAL
  Filled 2018-04-05: qty 1

## 2018-04-05 MED ORDER — GABAPENTIN 100 MG PO CAPS: 100.0000 mg | ORAL_CAPSULE | Freq: Three times a day (TID) | ORAL | 0 refills | Status: AC

## 2018-04-05 MED ORDER — PREDNISONE 10 MG PO TABS: ORAL_TABLET | ORAL | 0 refills | Status: DC

## 2018-04-05 NOTE — Discharge Instructions (Signed)
You have been seen in the Emergency Department (ED)  today for back pain.  Your workup and exam have not shown any acute abnormalities and you are likely suffering from muscle strain or possible problems with your discs, but there is no treatment that will fix your symptoms at this time.  Please try the provided prescriptions as written, and you may also use over-the-counter Tylenol 1000 mg up to four times daily as needed.  Please follow up with your doctor as soon as possible regarding today's ED visit and your back pain.  Return to the ED for worsening back pain, fever, weakness or numbness of either leg, or if you develop either (1) an inability to urinate or have bowel movements, or (2) loss of your ability to control your bathroom functions (if you start having "accidents"), or if you develop other new symptoms that concern you.

## 2018-04-05 NOTE — ED Triage Notes (Signed)
Patient ambulatory to triage with limping gait, without difficulty or distress noted; pt reports pain from right hip radiating down leg, increased with weight bearing; denies any known injury

## 2018-04-05 NOTE — ED Provider Notes (Addendum)
[facility] Emergency Department Provider Note  ____________________________________________   First MD Initiated Contact with Patient 04/05/18 [phone]     (approximate)  I have reviewed the triage vital signs and the nursing notes.   HISTORY  Chief Complaint Leg Pain    HPI Douglas Singh is a 37 y.o. male with medical history as listed below who presents for evaluation of acute onset pain in his right lower back that radiates down the back of his buttocks and into his upper thigh.  He is never had pain like this before.  It is reproduced when he ambulates or bears weight or raises his right leg.  There is no injury of which she is aware.  He reports is 10 out of 10 and rest makes a little bit better.  He denies fever/chills, chest pain or shortness of breath, nausea, vomiting, and abdominal pain.  He is not having any difficulty urinating and is having no dysuria.  Past Medical History:  Diagnosis Date  . Anxiety   . Asthma   . Panic attacks   . Reflux     Patient Active Problem List   Diagnosis Date Noted  . Panic attack 12/03/2015  . Anxiety 06/16/2014  . Acid reflux 06/16/2014    Past Surgical History:  Procedure Laterality Date  . URETHRA SURGERY  2000?   stricture?    Prior to Admission medications   Medication Sig Start Date End Date Taking? Authorizing Provider  albuterol (PROVENTIL HFA;VENTOLIN HFA) 108 (90 Base) MCG/ACT inhaler Inhale 2 puffs into the lungs every 6 (six) hours as needed for wheezing or shortness of breath. 03/07/18   Enid Derry, PA-C  clonazePAM (KLONOPIN) 1 MG tablet Take 1 tablet (1 mg total) by mouth as needed for up to 5 days for anxiety. 03/07/18 03/12/18  Enid Derry, PA-C  gabapentin (NEURONTIN) 100 MG capsule Take 1 capsule (100 mg total) by mouth 3 (three) times daily. 04/05/18 04/05/19  Loleta Rose, MD  metoCLOPramide (REGLAN) 10 MG tablet Take 1 tablet (10 mg total) by mouth every 8 (eight) hours as needed.  11/29/17   Rebecka Apley, MD  predniSONE (DELTASONE) 10 MG tablet Take 6 tabs (60 mg) PO x 3 days, then take 4 tabs (40 mg) PO x 3 days, then take 2 tabs (20 mg) PO x 3 days, then take 1 tab (10 mg) PO x 3 days, then take 1/2 tab (5 mg) PO x 4 days. 04/05/18   Loleta Rose, MD  promethazine (PHENERGAN) 12.5 MG tablet Take 1 tablet (12.5 mg total) by mouth every 6 (six) hours as needed for nausea or vomiting. 11/15/17   Willy Eddy, MD  ranitidine (ZANTAC) 150 MG tablet Take 1 tablet (150 mg total) by mouth 2 (two) times daily. 11/15/17 11/15/18  Willy Eddy, MD    Allergies Codeine  Family History  Problem Relation Age of Onset  . Bladder Cancer Neg Hx   . Kidney cancer Neg Hx   . Prostate cancer Neg Hx     Social History Social History   Tobacco Use  . Smoking status: Current Every Day Smoker    Packs/day: 0.50    Types: Cigarettes  . Smokeless tobacco: Never Used  Substance Use Topics  . Alcohol use: Yes    Comment: socially on weekends  . Drug use: No    Review of Systems Constitutional: No fever/chills Respiratory: Denies shortness of breath. Gastrointestinal: No abdominal pain.  No nausea nor vomiting Genitourinary: Negative for  dysuria.  No urinary hesitancy or difficulty emptying bladder Musculoskeletal: Pain and right lower back radiating down the back of the right buttocks.  Negative for neck pain.   Integumentary: Negative for rash. Neurological: Negative for headaches, focal weakness or numbness.   ____________________________________________   PHYSICAL EXAM:  VITAL SIGNS: ED Triage Vitals  Enc Vitals Group     BP 04/05/18 0252 (!) 139/100     Pulse Rate 04/05/18 0252 100     Resp 04/05/18 0252 20     Temp 04/05/18 0252 97.7 F (36.5 C)     Temp Source 04/05/18 0252 Oral     SpO2 04/05/18 0252 97 %     Weight 04/05/18 0250 96.6 kg (212 lb 15.4 oz)     Height 04/05/18 0250 1.727 m (5\' 8" )     Head Circumference --      Peak Flow --       Pain Score 04/05/18 0249 10     Pain Loc --      Pain Edu? --      Excl. in GC? --     Constitutional: Alert and oriented. Well appearing and in no acute distress.  Sleeping comfortably and snoring heavily when I entered the room but awoke to soft voice and light touch. Cardiovascular: Normal rate, regular rhythm. Good peripheral circulation.  Respiratory: Normal respiratory effort.  No retractions. Gastrointestinal: Soft and nontender. No distention.  Musculoskeletal: No lower extremity tenderness nor edema. No gross deformities of extremities.  Reproducible tenderness to palpation of the right side of the lower lumbar spine and soft tissues.  No tenderness to palpation in the midline and no step-offs or deformities.  The right buttocks are also tender to palpation and reproduce the pain.  Raising his right leg and when he engages and plantar flexion also reproduces the pain.  He has normal strength but is limited only by pain. Neurologic:  Normal speech and language. No gross focal neurologic deficits are appreciated.  Skin:  Skin is warm, dry and intact. No rash noted. Psychiatric: Mood and affect are normal. Speech and behavior are normal.  ____________________________________________   LABS (all labs ordered are listed, but only abnormal results are displayed)  Labs Reviewed - No data to display ____________________________________________  EKG  None - EKG not ordered by ED physician ____________________________________________  RADIOLOGY   ED MD interpretation: No indication for imaging  Official radiology report(s): No results found.  ____________________________________________   PROCEDURES  Critical Care performed: No   Procedure(s) performed:   Procedures   ____________________________________________   INITIAL IMPRESSION / ASSESSMENT AND PLAN / ED COURSE  As part of my medical decision making, I reviewed the following data within the electronic medical  record:  Nursing notes reviewed and incorporated, Old chart reviewed and Notes from prior ED visits    No signs or symptoms of emergent causes of back pain including but not limited to cauda equina syndrome, discitis/osteomyelitis, transverse myelitis.  The patient signs and symptoms are highly consistent with sciatica.  I informed the patient about the diagnosis and will provide him some non-opioid prescriptions that should help.  I encourage close outpatient follow-up.  He understands and agrees with the plan and I gave my usual and customary return precautions.     ____________________________________________  FINAL CLINICAL IMPRESSION(S) / ED DIAGNOSES  Final diagnoses:  Acute right-sided low back pain with right-sided sciatica     MEDICATIONS GIVEN DURING THIS VISIT:  Medications  ibuprofen (ADVIL,MOTRIN) tablet 600 mg (  has no administration in time range)  predniSONE (DELTASONE) tablet 60 mg (has no administration in time range)  gabapentin (NEURONTIN) capsule 100 mg (has no administration in time range)     ED Discharge Orders         Ordered    predniSONE (DELTASONE) 10 MG tablet     04/05/18 0610    gabapentin (NEURONTIN) 100 MG capsule  3 times daily     04/05/18 8119           Note:  This document was prepared using Dragon voice recognition software and may include unintentional dictation errors.    Loleta Rose, MD 04/05/18 1478    Loleta Rose, MD 04/05/18 [phone]

## 2018-04-11 ENCOUNTER — Emergency Department: Payer: Self-pay

## 2018-04-11 ENCOUNTER — Emergency Department
Admission: EM | Admit: 2018-04-11 | Discharge: 2018-04-11 | Disposition: A | Discharge status: Home / Self Care | Payer: Self-pay | Attending: Emergency Medicine | Admitting: Emergency Medicine

## 2018-04-11 ENCOUNTER — Other Ambulatory Visit: Payer: Self-pay

## 2018-04-11 DIAGNOSIS — J45909 Unspecified asthma, uncomplicated: Secondary | ICD-10-CM | POA: Insufficient documentation

## 2018-04-11 DIAGNOSIS — R0789 Other chest pain: Secondary | ICD-10-CM | POA: Insufficient documentation

## 2018-04-11 DIAGNOSIS — F17210 Nicotine dependence, cigarettes, uncomplicated: Secondary | ICD-10-CM | POA: Insufficient documentation

## 2018-04-11 MED ORDER — IBUPROFEN 600 MG PO TABS: 600.0000 mg | ORAL_TABLET | Freq: Four times a day (QID) | ORAL | 0 refills | Status: DC | PRN

## 2018-04-11 MED ORDER — CYCLOBENZAPRINE HCL 10 MG PO TABS: 10.0000 mg | ORAL_TABLET | Freq: Three times a day (TID) | ORAL | 0 refills | Status: AC | PRN

## 2018-04-11 NOTE — ED Triage Notes (Signed)
Pt in with co tenderness and soreness over right chest area. Tender to touch when worse when he moves and takes a deep breath.

## 2018-04-11 NOTE — Discharge Instructions (Signed)
Return to the ER for new, worsening, persistent severe pain, difficulty breathing, weakness or lightheadedness, or any other new or worsening symptoms that concern you. °

## 2018-04-11 NOTE — ED Provider Notes (Signed)
[facility] Emergency Department Provider Note ____________________________________________   First MD Initiated Contact with Patient 04/11/18 0258     (approximate)  I have reviewed the triage vital signs and the nursing notes.   HISTORY  Chief Complaint Breast Pain    HPI Douglas Singh is a 37 y.o. male with PMH as noted below who presents with right chest wall/breast pain over the last several days, gradual onset, atraumatic, and worse with certain positions or with taking a deep breath.  He denies any swelling or rash.  He reports some mild shortness of breath and having to use his inhaler over the last few days.  He denies any leg swelling.  No prior history of this pain.   Past Medical History:  Diagnosis Date  . Anxiety   . Asthma   . Panic attacks   . Reflux     Patient Active Problem List   Diagnosis Date Noted  . Panic attack 12/03/2015  . Anxiety 06/16/2014  . Acid reflux 06/16/2014    Past Surgical History:  Procedure Laterality Date  . URETHRA SURGERY  2000?   stricture?    Prior to Admission medications   Medication Sig Start Date End Date Taking? Authorizing Provider  albuterol (PROVENTIL HFA;VENTOLIN HFA) 108 (90 Base) MCG/ACT inhaler Inhale 2 puffs into the lungs every 6 (six) hours as needed for wheezing or shortness of breath. 03/07/18   Enid Derry, PA-C  clonazePAM (KLONOPIN) 1 MG tablet Take 1 tablet (1 mg total) by mouth as needed for up to 5 days for anxiety. 03/07/18 03/12/18  Enid Derry, PA-C  cyclobenzaprine (FLEXERIL) 10 MG tablet Take 1 tablet (10 mg total) by mouth 3 (three) times daily as needed for up to 5 days for muscle spasms. 04/11/18 04/16/18  Dionne Bucy, MD  gabapentin (NEURONTIN) 100 MG capsule Take 1 capsule (100 mg total) by mouth 3 (three) times daily. 04/05/18 04/05/19  Loleta Rose, MD  ibuprofen (ADVIL,MOTRIN) 600 MG tablet Take 1 tablet (600 mg total) by mouth every 6 (six) hours as needed.  04/11/18   Dionne Bucy, MD  metoCLOPramide (REGLAN) 10 MG tablet Take 1 tablet (10 mg total) by mouth every 8 (eight) hours as needed. 11/29/17   Rebecka Apley, MD  predniSONE (DELTASONE) 10 MG tablet Take 6 tabs (60 mg) PO x 3 days, then take 4 tabs (40 mg) PO x 3 days, then take 2 tabs (20 mg) PO x 3 days, then take 1 tab (10 mg) PO x 3 days, then take 1/2 tab (5 mg) PO x 4 days. 04/05/18   Loleta Rose, MD  promethazine (PHENERGAN) 12.5 MG tablet Take 1 tablet (12.5 mg total) by mouth every 6 (six) hours as needed for nausea or vomiting. 11/15/17   Willy Eddy, MD  ranitidine (ZANTAC) 150 MG tablet Take 1 tablet (150 mg total) by mouth 2 (two) times daily. 11/15/17 11/15/18  Willy Eddy, MD    Allergies Codeine  Family History  Problem Relation Age of Onset  . Bladder Cancer Neg Hx   . Kidney cancer Neg Hx   . Prostate cancer Neg Hx     Social History Social History   Tobacco Use  . Smoking status: Current Every Day Smoker    Packs/day: 0.50    Types: Cigarettes  . Smokeless tobacco: Never Used  Substance Use Topics  . Alcohol use: Yes    Comment: socially on weekends  . Drug use: No    Review of  Systems  Constitutional: No fever. Eyes: No redness. ENT: No neck pain. Cardiovascular: Positive for chest wall pain. Respiratory: Positive for shortness of breath. Gastrointestinal: No vomiting.  Genitourinary: Negative for flank pain.  Musculoskeletal: Negative for back pain. Skin: Negative for rash. Neurological: Negative for headache.   ____________________________________________   PHYSICAL EXAM:  VITAL SIGNS: ED Triage Vitals  Enc Vitals Group     BP 04/11/18 0204 137/77     Pulse Rate 04/11/18 0204 88     Resp 04/11/18 0204 20     Temp 04/11/18 0204 98.8 F (37.1 C)     Temp Source 04/11/18 0204 Oral     SpO2 04/11/18 0204 100 %     Weight 04/11/18 0205 210 lb (95.3 kg)     Height 04/11/18 0205 5\' 8"  (1.727 m)     Head Circumference --       Peak Flow --      Pain Score 04/11/18 0205 6     Pain Loc --      Pain Edu? --      Excl. in GC? --     Constitutional: Alert and oriented. Well appearing and in no acute distress. Eyes: Conjunctivae are normal.  Head: Atraumatic. Nose: No congestion/rhinnorhea. Mouth/Throat: Mucous membranes are moist.   Neck: Normal range of motion.  Cardiovascular: Normal rate, regular rhythm. Grossly normal heart sounds.  Good peripheral circulation. Respiratory: Normal respiratory effort.  No retractions. Lungs CTAB. Gastrointestinal: Soft and nontender. No distention.  Musculoskeletal: No lower extremity edema.  Extremities warm and well perfused.  Reproducible mild right chest wall tenderness. Neurologic:  Normal speech and language. No gross focal neurologic deficits are appreciated.  Skin:  Skin is warm and dry. No rash noted.  No erythema or induration over the chest.  No palpable masses. Psychiatric: Mood and affect are normal. Speech and behavior are normal.  ____________________________________________   LABS (all labs ordered are listed, but only abnormal results are displayed)  Labs Reviewed - No data to display ____________________________________________  EKG  ED ECG REPORT I, Dionne BucySebastian Joletta Manner, the attending physician, personally viewed and interpreted this ECG.  Date: 04/11/2018 EKG Time: 0401 Rate: 72 Rhythm: normal sinus rhythm QRS Axis: normal Intervals: normal ST/T Wave abnormalities: Early repolarization Narrative Interpretation: no evidence of acute ischemia; no significant change when compared to EKG of 01/10/2018  ____________________________________________  RADIOLOGY  CXR: No focal infiltrate or other acute abnormalities  ____________________________________________   PROCEDURES  Procedure(s) performed: No  Procedures  Critical Care performed: No ____________________________________________   INITIAL IMPRESSION / ASSESSMENT AND PLAN / ED  COURSE  Pertinent labs & imaging results that were available during my care of the patient were reviewed by me and considered in my medical decision making (see chart for details).  37 year old male with a history of anxiety and asthma presents with right chest wall/breast pain over the last several days with slight increased shortness of breath.  The pain is positional and reproducible, and fairly localized to the right lower chest wall.  On exam, the patient is well-appearing, vital signs are normal, and the pain is reproducible with position.  EKG is nonischemic.  Presentation is consistent with most likely muscular chest wall pain versus costochondritis.  There is no evidence of cardiac etiology.  Given the symptoms of slight shortness of breath, I will obtain a chest x-ray.  The patient has no leg pain or swelling to suggest DVT, and no risk factors for or symptoms to suggest PE.  Given  his well appearance and normal vital signs there is no evidence of aortic dissection or other vascular etiology.  Anticipate discharge home with NSAIDs.  ----------------------------------------- 4:21 AM on 04/11/2018 -----------------------------------------  Chest x-ray shows no acute findings.  The patient continues to appear comfortable.  He feels well to go home.  Return precautions given, and he expresses understanding.  ____________________________________________   FINAL CLINICAL IMPRESSION(S) / ED DIAGNOSES  Final diagnoses:  Chest wall pain      NEW MEDICATIONS STARTED DURING THIS VISIT:  New Prescriptions   CYCLOBENZAPRINE (FLEXERIL) 10 MG TABLET    Take 1 tablet (10 mg total) by mouth 3 (three) times daily as needed for up to 5 days for muscle spasms.   IBUPROFEN (ADVIL,MOTRIN) 600 MG TABLET    Take 1 tablet (600 mg total) by mouth every 6 (six) hours as needed.     Note:  This document was prepared using Dragon voice recognition software and may include unintentional dictation  errors.    Dionne BucySiadecki, Sonu Kruckenberg, MD 04/11/18 [phone]

## 2018-04-11 NOTE — ED Notes (Signed)
ED Provider at bedside. 

## 2018-05-15 LAB — HM HIV SCREENING LAB: HM HIV Screening: NEGATIVE

## 2018-05-19 ENCOUNTER — Other Ambulatory Visit: Payer: Self-pay

## 2018-05-19 ENCOUNTER — Emergency Department
Admission: EM | Admit: 2018-05-19 | Discharge: 2018-05-19 | Disposition: A | Discharge status: Home / Self Care | Payer: Self-pay | Attending: Student in an Organized Health Care Education/Training Program | Admitting: Student in an Organized Health Care Education/Training Program

## 2018-05-19 DIAGNOSIS — J069 Acute upper respiratory infection, unspecified: Secondary | ICD-10-CM | POA: Insufficient documentation

## 2018-05-19 DIAGNOSIS — J45909 Unspecified asthma, uncomplicated: Secondary | ICD-10-CM | POA: Insufficient documentation

## 2018-05-19 DIAGNOSIS — F17210 Nicotine dependence, cigarettes, uncomplicated: Secondary | ICD-10-CM | POA: Insufficient documentation

## 2018-05-19 DIAGNOSIS — Z79899 Other long term (current) drug therapy: Secondary | ICD-10-CM | POA: Insufficient documentation

## 2018-05-19 MED ORDER — BENZONATATE 100 MG PO CAPS: ORAL_CAPSULE | ORAL | 0 refills | Status: DC

## 2018-05-19 NOTE — Discharge Instructions (Signed)
[facility].  Continue taking Sudafed for congestion.  You may use saline nose spray for nasal congestion.  Begin taking Tessalon 1 or 2 every 8 hours as needed for cough.  Discontinue smoking.

## 2018-05-19 NOTE — ED Provider Notes (Signed)
[facility] Emergency Department Provider Note   ____________________________________________   None    (approximate)  I have reviewed the triage vital signs and the nursing notes.   HISTORY  Chief Complaint URI   HPI Douglas Singh is a 37 y.o. male presents to the ED with complaint of cough for 2 to 3 days.  He states that when he began having cold symptoms he did not take anything initially.  Yesterday he began taking Sudafed but is only taken 1 tablet.  He states the cough is aggravating his throat.  He is unaware of any fever or chills.  He continues to eat and drink as normal.  Patient also continues to smoke to 3 cigarettes/day.  Past Medical History:  Diagnosis Date  . Anxiety   . Asthma   . Panic attacks   . Reflux     Patient Active Problem List   Diagnosis Date Noted  . Panic attack 12/03/2015  . Anxiety 06/16/2014  . Acid reflux 06/16/2014    Past Surgical History:  Procedure Laterality Date  . URETHRA SURGERY  2000?   stricture?    Prior to Admission medications   Medication Sig Start Date End Date Taking? Authorizing Provider  albuterol (PROVENTIL HFA;VENTOLIN HFA) 108 (90 Base) MCG/ACT inhaler Inhale 2 puffs into the lungs every 6 (six) hours as needed for wheezing or shortness of breath. 03/07/18   Enid DerryWagner, Ashley, PA-C  benzonatate (TESSALON PERLES) 100 MG capsule 1 or 2 tablets every 8 hours as needed for coughing. 05/19/18   Tommi RumpsSummers, Jaquesha Boroff L, PA-C  clonazePAM (KLONOPIN) 1 MG tablet Take 1 tablet (1 mg total) by mouth as needed for up to 5 days for anxiety. 03/07/18 03/12/18  Enid DerryWagner, Ashley, PA-C  gabapentin (NEURONTIN) 100 MG capsule Take 1 capsule (100 mg total) by mouth 3 (three) times daily. 04/05/18 04/05/19  Loleta RoseForbach, Cory, MD  metoCLOPramide (REGLAN) 10 MG tablet Take 1 tablet (10 mg total) by mouth every 8 (eight) hours as needed. 11/29/17   Rebecka ApleyWebster, Allison P, MD    Allergies Codeine  Family History  Problem Relation Age of  Onset  . Bladder Cancer Neg Hx   . Kidney cancer Neg Hx   . Prostate cancer Neg Hx     Social History Social History   Tobacco Use  . Smoking status: Current Every Day Smoker    Packs/day: 0.50    Types: Cigarettes  . Smokeless tobacco: Never Used  Substance Use Topics  . Alcohol use: Yes    Comment: socially on weekends  . Drug use: No    Review of Systems Constitutional: No fever/chills Eyes: No visual changes. ENT: Positive throat irritation. Cardiovascular: Denies chest pain. Respiratory: Denies shortness of breath.  Positive nonproductive cough. Gastrointestinal: No abdominal pain.  No nausea, no vomiting.  No diarrhea.   Musculoskeletal: Negative for muscle spasms. Skin: Negative for rash. Neurological: Negative for headaches, focal weakness or numbness. ___________________________________________   PHYSICAL EXAM:  VITAL SIGNS: ED Triage Vitals [05/19/18 0402]  Enc Vitals Group     BP (!) 152/90     Pulse Rate 75     Resp 16     Temp 98.5 F (36.9 C)     Temp Source Oral     SpO2 100 %     Weight 210 lb (95.3 kg)     Height 5\' 8"  (1.727 m)     Head Circumference      Peak Flow  Pain Score 7     Pain Loc      Pain Edu?      Excl. in GC?    Constitutional: Alert and oriented. Well appearing and in no acute distress. Eyes: Conjunctivae are normal.  Head: Atraumatic. Nose: Minimal congestion/rhinnorhea.  EACs and TMs are clear bilaterally. Mouth/Throat: Mucous membranes are moist.  Oropharynx non-erythematous. Neck: No stridor.   Hematological/Lymphatic/Immunilogical: No cervical lymphadenopathy. Cardiovascular: Normal rate, regular rhythm. Grossly normal heart sounds.  Good peripheral circulation. Respiratory: Normal respiratory effort.  No retractions. Lungs CTAB. Musculoskeletal: Moves upper and lower extremities with any difficulty.  Normal gait was noted. Neurologic:  Normal speech and language. No gross focal neurologic deficits are  appreciated.  Skin:  Skin is warm, dry and intact.  Psychiatric: Mood and affect are normal. Speech and behavior are normal.  ____________________________________________   LABS (all labs ordered are listed, but only abnormal results are displayed)  Labs Reviewed - No data to display   PROCEDURES  Procedure(s) performed: None  Procedures  Critical Care performed: No  ____________________________________________   INITIAL IMPRESSION / ASSESSMENT AND PLAN / ED COURSE  As part of my medical decision making, I reviewed the following data within the electronic MEDICAL RECORD NUMBER Notes from prior ED visits and [place] Controlled Substance Database  Presents to the ED with complaint of cold symptoms for the last 2 to 3 days.  Patient has only began taking over-the-counter medication yesterday but states that the cough is still aggravating him.  He is taken Sudafed once.  He continues to smoke 2 to 3 cigarettes/day.  Physical exam is unremarkable except for the nasal congestion.  Patient is continue taking Sudafed over-the-counter and encouraged to increase fluids.  A prescription for Tessalon Perles 1 or 2 every 8 hours was sent to his pharmacy.  ____________________________________________   FINAL CLINICAL IMPRESSION(S) / ED DIAGNOSES  Final diagnoses:  Upper respiratory tract infection, unspecified type     ED Discharge Orders         Ordered    benzonatate (TESSALON PERLES) 100 MG capsule     05/19/18 0805           Note:  This document was prepared using Dragon voice recognition software and may include unintentional dictation errors.    Tommi Rumps, PA-C 05/19/18 1016    Willy Eddy, MD 05/19/18 [phone]

## 2018-05-19 NOTE — ED Triage Notes (Signed)
Pt states "I have a cold". Pt complains of nasal congestion, cough, headache. Pt appears in no acute distress. Pt states "i've tried sudafed and mucinex".

## 2018-05-31 ENCOUNTER — Emergency Department: Payer: Self-pay

## 2018-05-31 ENCOUNTER — Other Ambulatory Visit: Payer: Self-pay

## 2018-05-31 ENCOUNTER — Emergency Department
Admission: EM | Admit: 2018-05-31 | Discharge: 2018-05-31 | Disposition: A | Discharge status: Home / Self Care | Payer: Self-pay | Attending: Emergency Medicine | Admitting: Emergency Medicine

## 2018-05-31 ENCOUNTER — Encounter: Payer: Self-pay | Admitting: Emergency Medicine

## 2018-05-31 DIAGNOSIS — R0600 Dyspnea, unspecified: Secondary | ICD-10-CM | POA: Insufficient documentation

## 2018-05-31 DIAGNOSIS — J45909 Unspecified asthma, uncomplicated: Secondary | ICD-10-CM | POA: Insufficient documentation

## 2018-05-31 DIAGNOSIS — Z79899 Other long term (current) drug therapy: Secondary | ICD-10-CM | POA: Insufficient documentation

## 2018-05-31 DIAGNOSIS — F17210 Nicotine dependence, cigarettes, uncomplicated: Secondary | ICD-10-CM | POA: Insufficient documentation

## 2018-05-31 LAB — COMPREHENSIVE METABOLIC PANEL
ALBUMIN: 4.2 g/dL (ref 3.5–5.0)
ALT: 31 U/L (ref 0–44)
ANION GAP: 8 (ref 5–15)
AST: 21 U/L (ref 15–41)
Alkaline Phosphatase: 41 U/L (ref 38–126)
BILIRUBIN TOTAL: 1.1 mg/dL (ref 0.3–1.2)
BUN: 11 mg/dL (ref 6–20)
CO2: 26 mmol/L (ref 22–32)
Calcium: 9.2 mg/dL (ref 8.9–10.3)
Chloride: 104 mmol/L (ref 98–111)
Creatinine, Ser: 0.79 mg/dL (ref 0.61–1.24)
GFR calc Af Amer: >60 mL/min (ref >60)
GFR calc non Af Amer: >60 mL/min (ref >60)
GLUCOSE: 98 mg/dL (ref 70–99)
POTASSIUM: 4.2 mmol/L (ref 3.5–5.1)
SODIUM: 138 mmol/L (ref 135–145)
Total Protein: 7.1 g/dL (ref 6.5–8.1)

## 2018-05-31 LAB — CBC WITH DIFFERENTIAL/PLATELET
Basophils Absolute: 0.1 10*3/uL (ref 0–0.1)
Basophils Relative: 1 %
EOS PCT: 3 %
Eosinophils Absolute: 0.2 10*3/uL (ref 0–0.7)
HCT: 42.4 % (ref 40.0–52.0)
Hemoglobin: 15.1 g/dL (ref 13.0–18.0)
LYMPHS PCT: 29 %
Lymphs Abs: 1.5 10*3/uL (ref 1.0–3.6)
MCH: 31.7 pg (ref 26.0–34.0)
MCHC: 35.6 g/dL (ref 32.0–36.0)
MCV: 89 fL (ref 80.0–100.0)
Monocytes Absolute: 0.6 10*3/uL (ref 0.2–1.0)
Monocytes Relative: 11 %
Neutro Abs: 2.9 10*3/uL (ref 1.4–6.5)
Neutrophils Relative %: 56 %
PLATELETS: 248 10*3/uL (ref 150–440)
RBC: 4.77 MIL/uL (ref 4.40–5.90)
RDW: 15.3 % — ABNORMAL HIGH (ref 11.5–14.5)
WBC: 5.3 10*3/uL (ref 3.8–10.6)

## 2018-05-31 LAB — TROPONIN I: Troponin I: <0.03 ng/mL (ref <0.03)

## 2018-05-31 MED ORDER — ALBUTEROL SULFATE (2.5 MG/3ML) 0.083% IN NEBU
5.0000 mg | INHALATION_SOLUTION | Freq: Once | RESPIRATORY_TRACT | Status: AC
  Administered 2018-05-31: 5 mg via RESPIRATORY_TRACT
  Filled 2018-05-31: qty 6

## 2018-05-31 MED ORDER — GUAIFENESIN ER 600 MG PO TB12: 600.0000 mg | ORAL_TABLET | Freq: Two times a day (BID) | ORAL | 0 refills | Status: DC

## 2018-05-31 MED ORDER — ALBUTEROL SULFATE HFA 108 (90 BASE) MCG/ACT IN AERS: 2.0000 | INHALATION_SPRAY | Freq: Four times a day (QID) | RESPIRATORY_TRACT | 2 refills | Status: DC | PRN

## 2018-05-31 NOTE — ED Provider Notes (Addendum)
[facility] Emergency Department Provider Note  Time seen: 2:05 PM  I have reviewed the triage vital signs and the nursing notes.   HISTORY  Chief Complaint Shortness of Breath    HPI Douglas Singh is a 37 y.o. male with a past medical history of anxiety, asthma presents to the emergency department for cough/shortness of breath.  According to the patient he has a history of asthma, last week had an upper respiratory infection with cough congestion.  States he is gotten over the cold but continues to have thick phlegm in his throat feels like it is making it difficult to breathe and has to forcibly cough at times to try to get the mucus up.  Denies any fever.  Triage nurse noted the patient to have wheeze in triage was given albuterol.   Past Medical History:  Diagnosis Date  . Anxiety   . Asthma   . Panic attacks   . Reflux     Patient Active Problem List   Diagnosis Date Noted  . Panic attack 12/03/2015  . Anxiety 06/16/2014  . Acid reflux 06/16/2014    Past Surgical History:  Procedure Laterality Date  . URETHRA SURGERY  2000?   stricture?    Prior to Admission medications   Medication Sig Start Date End Date Taking? Authorizing Provider  albuterol (PROVENTIL HFA;VENTOLIN HFA) 108 (90 Base) MCG/ACT inhaler Inhale 2 puffs into the lungs every 6 (six) hours as needed for wheezing or shortness of breath. 03/07/18   Enid Derry, PA-C  benzonatate (TESSALON PERLES) 100 MG capsule 1 or 2 tablets every 8 hours as needed for coughing. 05/19/18   Tommi Rumps, PA-C  clonazePAM (KLONOPIN) 1 MG tablet Take 1 tablet (1 mg total) by mouth as needed for up to 5 days for anxiety. 03/07/18 03/12/18  Enid Derry, PA-C  gabapentin (NEURONTIN) 100 MG capsule Take 1 capsule (100 mg total) by mouth 3 (three) times daily. 04/05/18 04/05/19  Loleta Rose, MD  metoCLOPramide (REGLAN) 10 MG tablet Take 1 tablet (10 mg total) by mouth every 8 (eight) hours as needed.  11/29/17   Rebecka Apley, MD    Allergies  Allergen Reactions  . Codeine Itching    Family History  Problem Relation Age of Onset  . Bladder Cancer Neg Hx   . Kidney cancer Neg Hx   . Prostate cancer Neg Hx     Social History Social History   Tobacco Use  . Smoking status: Current Every Day Smoker    Packs/day: 0.50    Types: Cigarettes  . Smokeless tobacco: Never Used  Substance Use Topics  . Alcohol use: Yes    Comment: socially on weekends  . Drug use: No    Review of Systems Constitutional: Negative for fever ENT: Feels like there is thick mucus in his throat.  Denies sore throat. Cardiovascular: Negative for chest pain. Respiratory: Mild shortness of breath due to mucus per patient.  Occasional cough trying to cough up the mucus with minimal result per patient. Gastrointestinal: Negative for abdominal pain Genitourinary: Negative for urinary compaints Musculoskeletal: Negative for musculoskeletal complaints Skin: Negative for skin complaints  Neurological: Negative for headache All other ROS negative  ____________________________________________   PHYSICAL EXAM:  VITAL SIGNS: ED Triage Vitals  Enc Vitals Group     BP 05/31/18 1131 136/88     Pulse Rate 05/31/18 1131 73     Resp 05/31/18 1131 18     Temp 05/31/18 1131 97.7 F (  36.5 C)     Temp Source 05/31/18 1131 Oral     SpO2 05/31/18 1131 98 %     Weight 05/31/18 1132 220 lb (99.8 kg)     Height 05/31/18 1132 5\' 8"  (1.727 m)     Head Circumference --      Peak Flow --      Pain Score 05/31/18 1132 0     Pain Loc --      Pain Edu? --      Excl. in GC? --    Constitutional: Alert and oriented. Well appearing and in no distress. Eyes: Normal exam ENT   Head: Normocephalic and atraumatic.   Mouth/Throat: Mucous membranes are moist. Cardiovascular: Normal rate, regular rhythm. No murmur Respiratory: Normal respiratory effort without tachypnea nor retractions. Breath sounds are clear   Gastrointestinal: Soft and nontender. No distention.  Musculoskeletal: Nontender with normal range of motion in all extremities.  Neurologic:  Normal speech and language. No gross focal neurologic deficits  Skin:  Skin is warm, dry and intact.  Psychiatric: Mood and affect are normal.     RADIOLOGY  Chest x-ray negative  ____________________________________________   INITIAL IMPRESSION / ASSESSMENT AND PLAN / ED COURSE  Pertinent labs & imaging results that were available during my care of the patient were reviewed by me and considered in my medical decision making (see chart for details).  Patient presents to the emergency department with shortness of breath due to mucus in his throat per patient.  Just nurse noted the patient to have wheeze upon arrival.  Patient states he ran out of his albuterol inhaler last week.  Received an albuterol treatment in triage, during my evaluation patient has no apparent difficulty breathing satting 100% on room air with a normal respiratory rate.  Clear lung sounds bilaterally.  Labs are reassuring including a negative troponin.  X-ray is pending to help rule out pneumonia.  If the x-ray is negative anticipate likely discharge home with albuterol and guaifenesin to help loosen secretions.  X-ray is negative.  We will discharge home with guaifenesin and albuterol.  Patient agreeable to plan of care   EKG reviewed and interpreted by myself shows a normal sinus rhythm at 70 bpm with a narrow QRS, normal axis, normal intervals, no concerning ST changes.  ____________________________________________   FINAL CLINICAL IMPRESSION(S) / ED DIAGNOSES  Dyspnea    Minna Antis, MD 05/31/18 1528    Minna Antis, MD 06/15/18 2328

## 2018-05-31 NOTE — ED Notes (Addendum)
Pt walking in lobby, came to this RN, no shortness of breath noted, wanting to know when he was going to see an MD. I explained we will get him back as soon as a bed opens up. Pt verbalized understanding.

## 2018-05-31 NOTE — ED Triage Notes (Signed)
Pt arrives from home with complaints of shortness of breath for the last few days. Pt has hx of asthma and states his RX ran out last week. Wheezes heard on auscultation.

## 2018-08-07 ENCOUNTER — Encounter: Payer: Self-pay | Admitting: Emergency Medicine

## 2018-08-07 ENCOUNTER — Emergency Department
Admission: EM | Admit: 2018-08-07 | Discharge: 2018-08-07 | Disposition: A | Discharge status: Home / Self Care | Payer: Self-pay | Attending: Emergency Medicine | Admitting: Emergency Medicine

## 2018-08-07 ENCOUNTER — Other Ambulatory Visit: Payer: Self-pay

## 2018-08-07 DIAGNOSIS — J45909 Unspecified asthma, uncomplicated: Secondary | ICD-10-CM | POA: Insufficient documentation

## 2018-08-07 DIAGNOSIS — Z79899 Other long term (current) drug therapy: Secondary | ICD-10-CM | POA: Insufficient documentation

## 2018-08-07 DIAGNOSIS — H9202 Otalgia, left ear: Secondary | ICD-10-CM | POA: Insufficient documentation

## 2018-08-07 DIAGNOSIS — F17210 Nicotine dependence, cigarettes, uncomplicated: Secondary | ICD-10-CM | POA: Insufficient documentation

## 2018-08-07 MED ORDER — HYDROCORTISONE-ACETIC ACID 1-2 % OT SOLN: 3.0000 [drp] | Freq: Two times a day (BID) | OTIC | 0 refills | Status: DC

## 2018-08-07 MED ORDER — PREDNISONE 10 MG PO TABS: ORAL_TABLET | ORAL | 0 refills | Status: DC

## 2018-08-07 NOTE — ED Notes (Signed)
See triage note    Presents with left ear and jaw pain  States sx's started a few days ago  Afebrile on arrival

## 2018-08-07 NOTE — ED Provider Notes (Signed)
[facility] Emergency Department Provider Note  ____________________________________________  Time seen: Approximately 3:11 PM  I have reviewed the triage vital signs and the nursing notes.   HISTORY  Chief Complaint Otalgia    HPI Douglas Singh is a 37 y.o. male presents emergency department for evaluation of left ear pain on and off for 2 weeks.  Patient states that he had some leftover amoxicillin that he took over the weekend.  He also had some leftover eardrops that he applied over the weekend.  Pain seemed to get better but started again today.  Patient states that ear is not extremely painful but aches.  He states that pain is worse when he presses over the TMJ and when he opens and closes his mouth.  Ear initially itched but this went away.  No fever, chills, drainage.   Past Medical History:  Diagnosis Date  . Anxiety   . Asthma   . Panic attacks   . Reflux     Patient Active Problem List   Diagnosis Date Noted  . Panic attack 12/03/2015  . Anxiety 06/16/2014  . Acid reflux 06/16/2014    Past Surgical History:  Procedure Laterality Date  . URETHRA SURGERY  2000?   stricture?    Prior to Admission medications   Medication Sig Start Date End Date Taking? Authorizing Provider  acetic acid-hydrocortisone (VOSOL-HC) OTIC solution Place 3 drops into the left ear 2 (two) times daily. 08/07/18   Enid Derry, PA-C  albuterol (PROVENTIL HFA;VENTOLIN HFA) 108 (90 Base) MCG/ACT inhaler Inhale 2 puffs into the lungs every 6 (six) hours as needed for wheezing or shortness of breath. 05/31/18   Minna Antis, MD  benzonatate (TESSALON PERLES) 100 MG capsule 1 or 2 tablets every 8 hours as needed for coughing. 05/19/18   Tommi Rumps, PA-C  clonazePAM (KLONOPIN) 1 MG tablet Take 1 tablet (1 mg total) by mouth as needed for up to 5 days for anxiety. 03/07/18 03/12/18  Enid Derry, PA-C  gabapentin (NEURONTIN) 100 MG capsule Take 1 capsule (100  mg total) by mouth 3 (three) times daily. 04/05/18 04/05/19  Loleta Rose, MD  guaiFENesin (MUCINEX) 600 MG 12 hr tablet Take 1 tablet (600 mg total) by mouth 2 (two) times daily. 05/31/18   Minna Antis, MD  metoCLOPramide (REGLAN) 10 MG tablet Take 1 tablet (10 mg total) by mouth every 8 (eight) hours as needed. 11/29/17   Rebecka Apley, MD  predniSONE (DELTASONE) 10 MG tablet Take 6 tablets on day 1, take 5 tablets on day 2, take 4 tablets on day 3, take 3 tablets on day 4, take 2 tablets on day 5, take 1 tablet on day 6 08/07/18   Enid Derry, PA-C    Allergies Codeine  Family History  Problem Relation Age of Onset  . Bladder Cancer Neg Hx   . Kidney cancer Neg Hx   . Prostate cancer Neg Hx     Social History Social History   Tobacco Use  . Smoking status: Current Every Day Smoker    Packs/day: 0.50    Types: Cigarettes  . Smokeless tobacco: Never Used  Substance Use Topics  . Alcohol use: Yes    Comment: socially on weekends  . Drug use: No     Review of Systems  Constitutional: No fever/chills Eyes: No visual changes. No discharge. ENT: Positive for congestion and rhinorrhea. Cardiovascular: No chest pain. Respiratory: Positive for cough. No SOB. Gastrointestinal: No abdominal pain.  No nausea,  no vomiting.   Musculoskeletal: Negative for musculoskeletal pain. Skin: Negative for rash, abrasions, lacerations, ecchymosis. Neurological: Negative for headaches.   ____________________________________________   PHYSICAL EXAM:  VITAL SIGNS: ED Triage Vitals  Enc Vitals Group     BP 08/07/18 1420 127/76     Pulse Rate 08/07/18 1420 74     Resp 08/07/18 1420 18     Temp 08/07/18 1420 99.1 F (37.3 C)     Temp Source 08/07/18 1420 Oral     SpO2 08/07/18 1420 95 %     Weight 08/07/18 1348 230 lb (104.3 kg)     Height 08/07/18 1348 5\' 8"  (1.727 m)     Head Circumference --      Peak Flow --      Pain Score 08/07/18 1348 6     Pain Loc --      Pain  Edu? --      Excl. in GC? --      Constitutional: Alert and oriented. Well appearing and in no acute distress. Eyes: Conjunctivae are normal. PERRL. EOMI. No discharge. Head: Atraumatic. ENT: No frontal and maxillary sinus tenderness.      Ears: Tympanic membranes pearly gray with good landmarks. No discharge.  No tenderness to palpation of pinna or tragus.  No mastoid tenderness.      Nose: Mild congestion/rhinnorhea.      Mouth/Throat: Mucous membranes are moist. Oropharynx non-erythematous. Tonsils not enlarged. No exudates. Uvula midline.  Full range of motion of jaw.  Mild tenderness to palpation over left TMJ.  Pain elicited with opening and closing of mouth. Neck: No stridor.   Hematological/Lymphatic/Immunilogical: No cervical lymphadenopathy. Cardiovascular: Normal rate, regular rhythm.  Good peripheral circulation. Respiratory: Normal respiratory effort without tachypnea or retractions. Lungs CTAB. Good air entry to the bases with no decreased or absent breath sounds. Gastrointestinal: Bowel sounds 4 quadrants. Soft and nontender to palpation. No guarding or rigidity. No palpable masses. No distention. Musculoskeletal: Full range of motion to all extremities. No gross deformities appreciated. Neurologic:  Normal speech and language. No gross focal neurologic deficits are appreciated.  Skin:  Skin is warm, dry and intact. No rash noted. Psychiatric: Mood and affect are normal. Speech and behavior are normal. Patient exhibits appropriate insight and judgement.   ____________________________________________   LABS (all labs ordered are listed, but only abnormal results are displayed)  Labs Reviewed - No data to display ____________________________________________  EKG   ____________________________________________  RADIOLOGY   No results found.  ____________________________________________    PROCEDURES  Procedure(s) performed:     Procedures    Medications - No data to display   ____________________________________________   INITIAL IMPRESSION / ASSESSMENT AND PLAN / ED COURSE  Pertinent labs & imaging results that were available during my care of the patient were reviewed by me and considered in my medical decision making (see chart for details).  Review of the [place] CSRS was performed in accordance of the [facility] prior to dispensing any controlled drugs.     Patient presented emergency department for evaluation of left ear pain on and off for 2 weeks. Vital signs and exam are reassuring.  I do not see any indication of an infection and this may be because patient has taken some leftover antibiotic and eardrops that he had at home.  He described his ear as itchy so I will start him on some acetic acid to cover for fungal.  He also has some tenderness over the TMJ that is elicited with  opening and closing his mouth.  We will give him a prescription of prednisone to help with inflammation of joint and eustachian tube dysfunction.  Patient feels comfortable going home. Patient will be discharged home with prescriptions for prednisone and acetic acid/hydrocortisone. Patient is to follow up with ENT as needed or otherwise directed. Patient is given ED precautions to return to the ED for any worsening or new symptoms.     ____________________________________________  FINAL CLINICAL IMPRESSION(S) / ED DIAGNOSES  Final diagnoses:  Left ear pain      NEW MEDICATIONS STARTED DURING THIS VISIT:  ED Discharge Orders         Ordered    acetic acid-hydrocortisone (VOSOL-HC) OTIC solution  2 times daily     08/07/18 1547    predniSONE (DELTASONE) 10 MG tablet     08/07/18 1547              This chart was dictated using voice recognition software/Dragon. Despite best efforts to proofread, errors can occur which can change the meaning. Any change was purely unintentional.    Enid DerryWagner, [place] Cookson, PA-C 08/07/18  2210    Sharman CheekStafford, Phillip, MD 08/07/18 2322

## 2018-08-07 NOTE — ED Triage Notes (Signed)
Says left jaw joint pain. Tried ear drops-got better and then it came back

## 2018-08-07 NOTE — Care Management Note (Signed)
Case Management Note  Patient Details  Name: Unk PintoKeith Rehmann MRN: [id number] Date of Birth: Sep 01, 1980  Subjective/Objective:                  Patient is being seen in the ED for Otalgia.  RNCM in to see patient for frequent visits to the ED.  Patient has no PCP and no insurance.  Patient reports that he lives with his mother in [place].  He is not currently working, he is able to drive.  RNCM will refer to [facility]/MM- application for given.  List of resources for [facility] also given to patient including other indigent clinics [place].  RNCM signed off. Robbie LisJeanna Aldine Grainger RN BSN [phone]  Action/Plan:   Expected Discharge Date:                  Expected Discharge Plan:  Home/Self Care  In-House Referral:     Discharge planning Services  CM Consult  Post Acute Care Choice:    Choice offered to:     DME Arranged:    DME Agency:     HH Arranged:    HH Agency:     Status of Service:  Completed, signed off  If discussed at Long Length of Stay Meetings, dates discussed:    Additional Comments:  Allayne ButcherJeanna M Abdulrahman Bracey, RN 08/07/2018, 3:54 PM

## 2018-09-10 ENCOUNTER — Emergency Department
Admission: EM | Admit: 2018-09-10 | Discharge: 2018-09-10 | Discharge status: Against Medical Advice | Payer: Self-pay | Attending: Student in an Organized Health Care Education/Training Program | Admitting: Student in an Organized Health Care Education/Training Program

## 2018-09-10 ENCOUNTER — Emergency Department: Payer: Self-pay

## 2018-09-10 ENCOUNTER — Other Ambulatory Visit: Payer: Self-pay

## 2018-09-10 DIAGNOSIS — F17210 Nicotine dependence, cigarettes, uncomplicated: Secondary | ICD-10-CM | POA: Insufficient documentation

## 2018-09-10 DIAGNOSIS — J45909 Unspecified asthma, uncomplicated: Secondary | ICD-10-CM | POA: Insufficient documentation

## 2018-09-10 DIAGNOSIS — Z79899 Other long term (current) drug therapy: Secondary | ICD-10-CM | POA: Insufficient documentation

## 2018-09-10 DIAGNOSIS — R0602 Shortness of breath: Secondary | ICD-10-CM | POA: Insufficient documentation

## 2018-09-10 DIAGNOSIS — F419 Anxiety disorder, unspecified: Secondary | ICD-10-CM | POA: Insufficient documentation

## 2018-09-10 LAB — URINALYSIS, COMPLETE (UACMP) WITH MICROSCOPIC
BILIRUBIN URINE: NEGATIVE
Bacteria, UA: NONE SEEN
GLUCOSE, UA: NEGATIVE mg/dL
HGB URINE DIPSTICK: NEGATIVE
Ketones, ur: NEGATIVE mg/dL
Leukocytes, UA: NEGATIVE
Nitrite: NEGATIVE
PH: 6 (ref 5.0–8.0)
Protein, ur: NEGATIVE mg/dL
SPECIFIC GRAVITY, URINE: 1.002 — AB (ref 1.005–1.030)

## 2018-09-10 LAB — CBC WITH DIFFERENTIAL/PLATELET
Abs Immature Granulocytes: 0.07 10*3/uL (ref 0.00–0.07)
Basophils Absolute: 0 10*3/uL (ref 0.0–0.1)
Basophils Relative: 1 %
EOS PCT: 1 %
Eosinophils Absolute: 0.1 10*3/uL (ref 0.0–0.5)
HEMATOCRIT: 44.9 % (ref 39.0–52.0)
Hemoglobin: 15.3 g/dL (ref 13.0–17.0)
Immature Granulocytes: 1 %
LYMPHS ABS: 2.7 10*3/uL (ref 0.7–4.0)
Lymphocytes Relative: 37 %
MCH: 29.5 pg (ref 26.0–34.0)
MCHC: 34.1 g/dL (ref 30.0–36.0)
MCV: 86.5 fL (ref 80.0–100.0)
MONO ABS: 0.5 10*3/uL (ref 0.1–1.0)
MONOS PCT: 7 %
Neutro Abs: 3.8 10*3/uL (ref 1.7–7.7)
Neutrophils Relative %: 53 %
Platelets: 277 10*3/uL (ref 150–400)
RBC: 5.19 MIL/uL (ref 4.22–5.81)
RDW: 13.2 % (ref 11.5–15.5)
WBC: 7.1 10*3/uL (ref 4.0–10.5)
nRBC: 0 % (ref 0.0–0.2)

## 2018-09-10 LAB — COMPREHENSIVE METABOLIC PANEL
ALK PHOS: 52 U/L (ref 38–126)
ALT: 27 U/L (ref 0–44)
ANION GAP: 12 (ref 5–15)
AST: 23 U/L (ref 15–41)
Albumin: 4.8 g/dL (ref 3.5–5.0)
BILIRUBIN TOTAL: 1.2 mg/dL (ref 0.3–1.2)
BUN: 9 mg/dL (ref 6–20)
CALCIUM: 9.4 mg/dL (ref 8.9–10.3)
CO2: 22 mmol/L (ref 22–32)
Chloride: 106 mmol/L (ref 98–111)
Creatinine, Ser: 0.9 mg/dL (ref 0.61–1.24)
GFR calc Af Amer: >60 mL/min (ref >60)
GFR calc non Af Amer: >60 mL/min (ref >60)
GLUCOSE: 123 mg/dL — AB (ref 70–99)
Potassium: 3.2 mmol/L — ABNORMAL LOW (ref 3.5–5.1)
Sodium: 140 mmol/L (ref 135–145)
TOTAL PROTEIN: 8.5 g/dL — AB (ref 6.5–8.1)

## 2018-09-10 LAB — URINE DRUG SCREEN, QUALITATIVE (ARMC ONLY)
Amphetamines, Ur Screen: NOT DETECTED
BARBITURATES, UR SCREEN: NOT DETECTED
BENZODIAZEPINE, UR SCRN: NOT DETECTED
CANNABINOID 50 NG, UR ~~LOC~~: NOT DETECTED
Cocaine Metabolite,Ur ~~LOC~~: NOT DETECTED
MDMA (Ecstasy)Ur Screen: NOT DETECTED
Methadone Scn, Ur: NOT DETECTED
Opiate, Ur Screen: NOT DETECTED
PHENCYCLIDINE (PCP) UR S: NOT DETECTED
Tricyclic, Ur Screen: NOT DETECTED

## 2018-09-10 MED ORDER — CLONAZEPAM 0.5 MG PO TABS
1.0000 mg | ORAL_TABLET | Freq: Once | ORAL | Status: DC
  Filled 2018-09-10: qty 2

## 2018-09-10 NOTE — ED Notes (Signed)
Pt states he has been drinking for the last several days and has not taken his xanax. Pt states this after labs are drawn. Pt reports going through several fifths today with friends.

## 2018-09-10 NOTE — ED Notes (Signed)
Pt states history of anxiety and "panic". States it's worse at night. Pt states he has an RX for xanax but has not taken it since Friday bc he has been drinking.

## 2018-09-10 NOTE — ED Provider Notes (Signed)
[facility] Emergency Department Provider Note  ____________________________________________   First MD Initiated Contact with Patient 09/10/18 2107     (approximate)  I have reviewed the triage vital signs and the nursing notes.   HISTORY  Chief Complaint Shortness of Breath   HPI Douglas Singh is a 38 y.o. male who presents to the emergency department for shortness of breath and anxiety. Chest feels tight, mouth is dry, and heart is racing. Symptoms typically resolve with Xanax. He has recently been drinking with friends and has not taken his Xanax.  He states that he was recently transitioned from Klonopin to Xanax and currently is out of both of them.   Past Medical History:  Diagnosis Date  . Anxiety   . Asthma   . Panic attacks   . Reflux     Patient Active Problem List   Diagnosis Date Noted  . Panic attack 12/03/2015  . Anxiety 06/16/2014  . Acid reflux 06/16/2014    Past Surgical History:  Procedure Laterality Date  . URETHRA SURGERY  2000?   stricture?    Prior to Admission medications   Medication Sig Start Date End Date Taking? Authorizing Provider  acetic acid-hydrocortisone (VOSOL-HC) OTIC solution Place 3 drops into the left ear 2 (two) times daily. 08/07/18   Enid Derry, PA-C  albuterol (PROVENTIL HFA;VENTOLIN HFA) 108 (90 Base) MCG/ACT inhaler Inhale 2 puffs into the lungs every 6 (six) hours as needed for wheezing or shortness of breath. 05/31/18   Minna Antis, MD  benzonatate (TESSALON PERLES) 100 MG capsule 1 or 2 tablets every 8 hours as needed for coughing. 05/19/18   Tommi Rumps, PA-C  clonazePAM (KLONOPIN) 1 MG tablet Take 1 tablet (1 mg total) by mouth as needed for up to 5 days for anxiety. 03/07/18 03/12/18  Enid Derry, PA-C  gabapentin (NEURONTIN) 100 MG capsule Take 1 capsule (100 mg total) by mouth 3 (three) times daily. 04/05/18 04/05/19  Loleta Rose, MD  guaiFENesin (MUCINEX) 600 MG 12 hr tablet  Take 1 tablet (600 mg total) by mouth 2 (two) times daily. 05/31/18   Minna Antis, MD  metoCLOPramide (REGLAN) 10 MG tablet Take 1 tablet (10 mg total) by mouth every 8 (eight) hours as needed. 11/29/17   Rebecka Apley, MD  predniSONE (DELTASONE) 10 MG tablet Take 6 tablets on day 1, take 5 tablets on day 2, take 4 tablets on day 3, take 3 tablets on day 4, take 2 tablets on day 5, take 1 tablet on day 6 08/07/18   Enid Derry, PA-C    Allergies Codeine  Family History  Problem Relation Age of Onset  . Bladder Cancer Neg Hx   . Kidney cancer Neg Hx   . Prostate cancer Neg Hx     Social History Social History   Tobacco Use  . Smoking status: Current Every Day Smoker    Packs/day: 0.50    Types: Cigarettes  . Smokeless tobacco: Never Used  Substance Use Topics  . Alcohol use: Yes    Comment: socially on weekends  . Drug use: No    Review of Systems  Constitutional: No fever/chills Eyes: No visual changes. ENT: No sore throat. Cardiovascular: Positive for chest tightness Respiratory: Positive for shortness of breath. Gastrointestinal: No abdominal pain.  No nausea, no vomiting.  No diarrhea.  No constipation. Genitourinary: Negative for dysuria. Musculoskeletal: Negative for back pain. Skin: Negative for rash. Neurological: Negative for headaches, focal weakness or numbness. ____________________________________________  PHYSICAL EXAM:  VITAL SIGNS: ED Triage Vitals  Enc Vitals Group     BP 09/10/18 2007 (!) 143/85     Pulse Rate 09/10/18 2007 (!) 141     Resp 09/10/18 2007 16     Temp 09/10/18 2007 97.8 F (36.6 C)     Temp Source 09/10/18 2007 Oral     SpO2 09/10/18 2007 98 %     Weight 09/10/18 2007 230 lb (104.3 kg)     Height 09/10/18 2007 5\' 8"  (1.727 m)     Head Circumference --      Peak Flow --      Pain Score 09/10/18 2016 2     Pain Loc --      Pain Edu? --      Excl. in GC? --     Constitutional: Alert and oriented. Well appearing  and in no acute distress. Eyes: Conjunctivae are bloodshot.  Head: Atraumatic. Nose: No congestion/rhinnorhea. Mouth/Throat: Mucous membranes are moist.  Oropharynx non-erythematous. Neck: No stridor.   Cardiovascular: Normal rate, regular rhythm. Grossly normal heart sounds.  Good peripheral circulation. Respiratory: Normal respiratory effort.  No retractions. Lungs CTAB. Gastrointestinal: Soft and nontender. No distention. No abdominal bruits. No CVA tenderness. Musculoskeletal: No lower extremity tenderness nor edema.  No joint effusions. Neurologic:  Normal speech and language. No gross focal neurologic deficits are appreciated. No gait instability. Skin:  Skin is warm, dry and intact. No rash noted. Psychiatric: Mood and affect are normal. Speech and behavior are normal.  ____________________________________________   LABS (all labs ordered are listed, but only abnormal results are displayed)  Labs Reviewed  COMPREHENSIVE METABOLIC PANEL - Abnormal; Notable for the following components:      Result Value   Potassium 3.2 (*)    Glucose, Bld 123 (*)    Total Protein 8.5 (*)    All other components within normal limits  URINALYSIS, COMPLETE (UACMP) WITH MICROSCOPIC - Abnormal; Notable for the following components:   Color, Urine COLORLESS (*)    APPearance CLEAR (*)    Specific Gravity, Urine 1.002 (*)    All other components within normal limits  CBC WITH DIFFERENTIAL/PLATELET  URINE DRUG SCREEN, QUALITATIVE ([facility] ONLY)   ____________________________________________  EKG  ED ECG REPORT I, Karthikeya Funke, FNP-BC personally viewed and interpreted this ECG.   Date: 09/10/2018  Rate: 134  Rhythm: sinus tachycardia  Axis: normal  Intervals:none  ST&T Change: None  ____________________________________________  RADIOLOGY  ED MD interpretation: No cardiopulmonary abnormality on chest x-ray.  Official radiology report(s): Dg Chest 2 View  Result Date:  09/10/2018 CLINICAL DATA:  Shortness of breath and chest pain EXAM: CHEST - 2 VIEW COMPARISON:  May 31, 2018 FINDINGS: Lungs are clear. Heart size and pulmonary vascularity are normal. No adenopathy. No pneumothorax. No bone lesions. IMPRESSION: No edema or consolidation. Electronically Signed   By: Bretta BangWilliam  Woodruff III M.D.   On: 09/10/2018 20:44    ____________________________________________   PROCEDURES  Procedure(s) performed: None  Procedures  Critical Care performed: No  ____________________________________________   INITIAL IMPRESSION / ASSESSMENT AND PLAN / ED COURSE  As part of my medical decision making, I reviewed the following data within the electronic MEDICAL RECORD NUMBER [place] Controlled Substance Database   38 year old male presenting to the emergency department with complaints of anxiety with associated chest tightness and shortness of breath.  He was noted to be tachycardic on the initial EKG.  Labs are reassuring.  Because the patient states that he has  been prescribed Xanax but it is not listed on his controlled substance report, I feel that it is necessary to obtain a urine drug screen to ensure that he is being honest about not having taken any substances with the exception of alcohol over the past few days.  Urinalysis and urine drug screen confirmed that the patient has not had any types of illicit substances within the recent days and a Klonopin had been ordered for him.  The patient was not found to be in the room when the nurse went to administer his medications.  He was unable to find him in the lobby or parking lot.  I am to assume that the patient eloped.   ___________________________________________   FINAL CLINICAL IMPRESSION(S) / ED DIAGNOSES  Final diagnoses:  SOB (shortness of breath)  Anxiety     ED Discharge Orders    None       Note:  This document was prepared using Dragon voice recognition software and may include unintentional  dictation errors.    Chinita Pesterriplett, Zavian Slowey B, FNP 09/11/18 0250    Willy Eddyobinson, Patrick, MD 09/11/18 1202

## 2018-09-10 NOTE — ED Triage Notes (Signed)
Pt to the er for sob. Pt has a hx of asthma but states he is not using his inahaler. Pt reports a dry hacking cough. Pt does not have any labored respirations at this time. Symptoms began a few hours ago.

## 2018-09-10 NOTE — ED Notes (Signed)
RN went to the Pt's room to give medication and the Pt was not in the room. RN looked for the Pt and called multiple times his name outside and in the lobby of the ED without answer.

## 2018-09-20 ENCOUNTER — Other Ambulatory Visit: Payer: Self-pay

## 2018-09-20 ENCOUNTER — Emergency Department
Admission: EM | Admit: 2018-09-20 | Discharge: 2018-09-20 | Disposition: A | Discharge status: Home / Self Care | Payer: Self-pay | Attending: Emergency Medicine | Admitting: Emergency Medicine

## 2018-09-20 DIAGNOSIS — Z79899 Other long term (current) drug therapy: Secondary | ICD-10-CM | POA: Insufficient documentation

## 2018-09-20 DIAGNOSIS — J45909 Unspecified asthma, uncomplicated: Secondary | ICD-10-CM | POA: Insufficient documentation

## 2018-09-20 DIAGNOSIS — F17210 Nicotine dependence, cigarettes, uncomplicated: Secondary | ICD-10-CM | POA: Insufficient documentation

## 2018-09-20 DIAGNOSIS — F419 Anxiety disorder, unspecified: Secondary | ICD-10-CM | POA: Insufficient documentation

## 2018-09-20 DIAGNOSIS — R0789 Other chest pain: Secondary | ICD-10-CM | POA: Insufficient documentation

## 2018-09-20 NOTE — ED Provider Notes (Signed)
[facility] Emergency Department Provider Note  ____________________________________________   First MD Initiated Contact with Patient 09/20/18 0303     (approximate)  I have reviewed the triage vital signs and the nursing notes.   HISTORY  Chief Complaint Muscle Pain    HPI [name] is a 38 y.o. male well-known to the emergency department for variety of complaints (this is his 17th ED visit to [facility] within the last year) who presents for evaluation of right-sided chest wall tenderness.  He presented with this complaint about 6 months ago.  He says that it hurts when he pushes on the right side of his chest wall and he thinks there might be a lump.  He denies any other chest pain, shortness of breath, nausea, vomiting, abdominal pain, fever/chills.  The pain is only reproducible with palpation and does not occur with position changes, leaning forward, or deep breathing.  He has had no trauma recently.  Past Medical History:  Diagnosis Date  . Anxiety   . Asthma   . Panic attacks   . Reflux     Patient Active Problem List   Diagnosis Date Noted  . Panic attack 12/03/2015  . Anxiety 06/16/2014  . Acid reflux 06/16/2014    Past Surgical History:  Procedure Laterality Date  . URETHRA SURGERY  2000?   stricture?    Prior to Admission medications   Medication Sig Start Date End Date Taking? Authorizing Provider  acetic acid-hydrocortisone (VOSOL-HC) OTIC solution Place 3 drops into the left ear 2 (two) times daily. 08/07/18   Enid DerryWagner, Ashley, PA-C  albuterol (PROVENTIL HFA;VENTOLIN HFA) 108 (90 Base) MCG/ACT inhaler Inhale 2 puffs into the lungs every 6 (six) hours as needed for wheezing or shortness of breath. 05/31/18   Minna AntisPaduchowski, Kevin, MD  benzonatate (TESSALON PERLES) 100 MG capsule 1 or 2 tablets every 8 hours as needed for coughing. 05/19/18   Tommi RumpsSummers, Rhonda L, PA-C  clonazePAM (KLONOPIN) 1 MG tablet Take 1 tablet (1 mg total) by mouth as  needed for up to 5 days for anxiety. 03/07/18 03/12/18  Enid DerryWagner, Ashley, PA-C  gabapentin (NEURONTIN) 100 MG capsule Take 1 capsule (100 mg total) by mouth 3 (three) times daily. 04/05/18 04/05/19  Loleta RoseForbach, Kenli Waldo, MD  guaiFENesin (MUCINEX) 600 MG 12 hr tablet Take 1 tablet (600 mg total) by mouth 2 (two) times daily. 05/31/18   Minna AntisPaduchowski, Kevin, MD  metoCLOPramide (REGLAN) 10 MG tablet Take 1 tablet (10 mg total) by mouth every 8 (eight) hours as needed. 11/29/17   Rebecka ApleyWebster, Allison P, MD  predniSONE (DELTASONE) 10 MG tablet Take 6 tablets on day 1, take 5 tablets on day 2, take 4 tablets on day 3, take 3 tablets on day 4, take 2 tablets on day 5, take 1 tablet on day 6 08/07/18   Enid DerryWagner, Ashley, PA-C    Allergies Codeine  Family History  Problem Relation Age of Onset  . Bladder Cancer Neg Hx   . Kidney cancer Neg Hx   . Prostate cancer Neg Hx     Social History Social History   Tobacco Use  . Smoking status: Current Every Day Smoker    Packs/day: 0.50    Types: Cigarettes  . Smokeless tobacco: Never Used  Substance Use Topics  . Alcohol use: Yes    Comment: socially on weekends  . Drug use: No    Review of Systems Constitutional: No fever/chills Cardiovascular: Chest wall tenderness as described above. Respiratory: Denies shortness of breath.  Gastrointestinal: No abdominal pain.  No nausea, no vomiting.   Musculoskeletal: Chest wall tenderness as described above.  Negative for neck pain.  Negative for back pain. Integumentary: Negative for rash. Neurological: Negative for headaches, focal weakness or numbness.   ____________________________________________   PHYSICAL EXAM:  VITAL SIGNS: ED Triage Vitals [09/20/18 0253]  Enc Vitals Group     BP (!) 153/88     Pulse Rate 85     Resp 20     Temp 97.8 F (36.6 C)     Temp Source Oral     SpO2 100 %     Weight 104.3 kg (230 lb)     Height 1.727 m (5\' 8" )     Head Circumference      Peak Flow      Pain Score 7     Pain  Loc      Pain Edu?      Excl. in GC?     Constitutional: Alert and oriented. Well appearing and in no acute distress. Eyes: Conjunctivae are normal.  Head: Atraumatic. Cardiovascular: Normal rate, regular rhythm. Good peripheral circulation. Grossly normal heart sounds. Respiratory: Normal respiratory effort.  No retractions. Lungs CTAB. Gastrointestinal: Soft and nontender. No distention.  Musculoskeletal: Reproducible chest wall tenderness in the middle of his chest just to the right of his sternum.  No lumps, bumps, fluctuance, induration, etc. are palpable.  The tenderness is relatively mild. Neurologic:  Normal speech and language. No gross focal neurologic deficits are appreciated.  Skin:  Skin is warm, dry and intact. No rash noted. Psychiatric: Mood and affect are normal. Speech and behavior are normal.  ____________________________________________   LABS (all labs ordered are listed, but only abnormal results are displayed)  Labs Reviewed - No data to display ____________________________________________  EKG  ED ECG REPORT I, Loleta Rose, the attending physician, personally viewed and interpreted this ECG.  Date: 09/20/2018 EKG Time: 3:14 AM Rate: 76 Rhythm: normal sinus rhythm QRS Axis: normal Intervals: normal ST/T Wave abnormalities: normal Narrative Interpretation: no evidence of acute ischemia  ____________________________________________  RADIOLOGY   ED MD interpretation: No indication for imaging  Official radiology report(s): No results found.  ____________________________________________   PROCEDURES  Critical Care performed: No   Procedure(s) performed:   Procedures   ____________________________________________   INITIAL IMPRESSION / ASSESSMENT AND PLAN / ED COURSE  As part of my medical decision making, I reviewed the following data within the electronic MEDICAL RECORD NUMBER Nursing notes reviewed and incorporated, EKG interpreted ,  Old chart reviewed, Notes from prior ED visits and [place] Controlled Substance Database    The patient has reproducible chest wall tenderness similar to the tenderness he has had in the past.  He has frequent emergency department visits for shortness of breath, chest pain, anxiety, and a variety of other complaints, but he is cheerful and does not appear anxious at this time.  He has had normal imaging and lab work as well as EKGs multiple times in the past including a recent visit that was about 10 days ago.  No indication of pericarditis on EKG.  I reiterated the instructions he was given about 6 months ago regarding chest wall pain/costochondritis and encouraged him to take ibuprofen 3 times a day with meals.  I gave my usual and customary return precautions.  Low risk chest pain, no indication for lab work.     ____________________________________________  FINAL CLINICAL IMPRESSION(S) / ED DIAGNOSES  Final diagnoses:  Chest wall pain  MEDICATIONS GIVEN DURING THIS VISIT:  Medications - No data to display   ED Discharge Orders    None       Note:  This document was prepared using Dragon voice recognition software and may include unintentional dictation errors.    Loleta Rose, MD 09/20/18 [phone]

## 2018-09-20 NOTE — ED Triage Notes (Addendum)
Pt in with chest wall pain. States area is tender to touch and hurts when he moves.

## 2018-09-20 NOTE — Discharge Instructions (Signed)

## 2019-01-27 ENCOUNTER — Encounter: Payer: Self-pay | Admitting: Physician Assistant

## 2019-01-27 ENCOUNTER — Other Ambulatory Visit: Payer: Self-pay

## 2019-01-27 ENCOUNTER — Emergency Department
Admission: EM | Admit: 2019-01-27 | Discharge: 2019-01-27 | Disposition: A | Discharge status: Home / Self Care | Payer: Self-pay | Attending: Emergency Medicine | Admitting: Emergency Medicine

## 2019-01-27 DIAGNOSIS — Z79899 Other long term (current) drug therapy: Secondary | ICD-10-CM | POA: Insufficient documentation

## 2019-01-27 DIAGNOSIS — J4521 Mild intermittent asthma with (acute) exacerbation: Secondary | ICD-10-CM | POA: Insufficient documentation

## 2019-01-27 DIAGNOSIS — F17210 Nicotine dependence, cigarettes, uncomplicated: Secondary | ICD-10-CM | POA: Insufficient documentation

## 2019-01-27 DIAGNOSIS — J45909 Unspecified asthma, uncomplicated: Secondary | ICD-10-CM | POA: Insufficient documentation

## 2019-01-27 MED ORDER — PREDNISONE 10 MG (21) PO TBPK: ORAL_TABLET | ORAL | 0 refills | Status: DC

## 2019-01-27 NOTE — ED Triage Notes (Signed)
Patient reports feeling short of breath and has history of asthma.  Patient ambulatory with no difficulty or distress noted.  Patient is speaking in complete sentences without difficulty, laughing and joking with friend.

## 2019-01-27 NOTE — ED Notes (Signed)
Patient observed walking back into lobby from outside.  No acute distress noted.

## 2019-01-27 NOTE — ED Notes (Addendum)
Pt's breathing is regular and unlabored. Pt ambulatory to Flex with no distress noted. Pt has hx of asthma. Pt denies N/V/D, fever and chills.

## 2019-01-27 NOTE — ED Notes (Signed)
Pt verbalized understanding of discharge instructions. NAD at this time. 

## 2019-01-27 NOTE — ED Provider Notes (Signed)
[facility] Emergency Department Provider Note  ____________________________________________   First MD Initiated Contact with Patient 01/27/19 [phone]     (approximate)  I have reviewed the triage vital signs and the nursing notes.   HISTORY  Chief Complaint Shortness of Breath    HPI Douglas Singh is a 38 y.o. male complains of some shortness of breath.  Patient states it does feel better once he uses his inhaler.  He denies any fever, cough, congestion, chest pain, abdominal pain, or any exposure to a COVID patient.    Past Medical History:  Diagnosis Date  . Anxiety   . Asthma   . Panic attacks   . Reflux     Patient Active Problem List   Diagnosis Date Noted  . Panic attack 12/03/2015  . Anxiety 06/16/2014  . Acid reflux 06/16/2014    Past Surgical History:  Procedure Laterality Date  . URETHRA SURGERY  2000?   stricture?    Prior to Admission medications   Medication Sig Start Date End Date Taking? Authorizing Provider  acetic acid-hydrocortisone (VOSOL-HC) OTIC solution Place 3 drops into the left ear 2 (two) times daily. 08/07/18   Enid DerryWagner, Ashley, PA-C  albuterol (PROVENTIL HFA;VENTOLIN HFA) 108 (90 Base) MCG/ACT inhaler Inhale 2 puffs into the lungs every 6 (six) hours as needed for wheezing or shortness of breath. 05/31/18   Minna AntisPaduchowski, Kevin, MD  clonazePAM (KLONOPIN) 1 MG tablet Take 1 tablet (1 mg total) by mouth as needed for up to 5 days for anxiety. 03/07/18 03/12/18  Enid DerryWagner, Ashley, PA-C  gabapentin (NEURONTIN) 100 MG capsule Take 1 capsule (100 mg total) by mouth 3 (three) times daily. 04/05/18 04/05/19  Loleta RoseForbach, Cory, MD  predniSONE (STERAPRED UNI-PAK 21 TAB) 10 MG (21) TBPK tablet Take 6 pills on day one then decrease by 1 pill each day 01/27/19   Faythe GheeFisher, Yashua Bracco W, PA-C    Allergies Codeine  Family History  Problem Relation Age of Onset  . Bladder Cancer Neg Hx   . Kidney cancer Neg Hx   . Prostate cancer Neg Hx     Social  History Social History   Tobacco Use  . Smoking status: Current Every Day Smoker    Packs/day: 0.50    Types: Cigarettes  . Smokeless tobacco: Never Used  Substance Use Topics  . Alcohol use: Yes    Comment: socially on weekends  . Drug use: No    Review of Systems  Constitutional: No fever/chills Eyes: No visual changes. ENT: No sore throat. Respiratory: Denies cough positive asthma exasperation Genitourinary: Negative for dysuria. Musculoskeletal: Negative for back pain. Skin: Negative for rash.    ____________________________________________   PHYSICAL EXAM:  VITAL SIGNS: ED Triage Vitals  Enc Vitals Group     BP 01/27/19 0522 (!) 162/97     Pulse Rate 01/27/19 0522 90     Resp 01/27/19 0522 18     Temp 01/27/19 0522 98.2 F (36.8 C)     Temp Source 01/27/19 0522 Oral     SpO2 01/27/19 0522 97 %     Weight 01/27/19 0517 220 lb (99.8 kg)     Height 01/27/19 0517 5\' 8"  (1.727 m)     Head Circumference --      Peak Flow --      Pain Score 01/27/19 0517 0     Pain Loc --      Pain Edu? --      Excl. in GC? --  Constitutional: Alert and oriented. Well appearing and in no acute distress. Eyes: Conjunctivae are normal.  Head: Atraumatic. Nose: No congestion/rhinnorhea. Mouth/Throat: Mucous membranes are moist.   Neck:  supple no lymphadenopathy noted Cardiovascular: Normal rate, regular rhythm. Heart sounds are normal Respiratory: Normal respiratory effort.  No retractions, lungs c t a  GU: deferred Musculoskeletal: FROM all extremities, warm and well perfused Neurologic:  Normal speech and language.  Skin:  Skin is warm, dry and intact. No rash noted. Psychiatric: Mood and affect are normal. Speech and behavior are normal.  ____________________________________________   LABS (all labs ordered are listed, but only abnormal results are displayed)  Labs Reviewed - No data to display ____________________________________________    ____________________________________________  RADIOLOGY    ____________________________________________   PROCEDURES  Procedure(s) performed: No  Procedures    ____________________________________________   INITIAL IMPRESSION / ASSESSMENT AND PLAN / ED COURSE  Pertinent labs & imaging results that were available during my care of the patient were reviewed by me and considered in my medical decision making (see chart for details).   Patient is 38 year old male who is well-known to this ED who presents complaining of some shortness of breath.  He states it is relieved with his inhaler.  Physical exam patient appears very well.  Is resting lying down speaking in full sentences.  Lungs are clear to all station.  Explained to the patient that this is his asthma.  He has no symptoms of COVID.  He is discharged in stable condition with a prescription for Sterapred.  He is to continue using his inhaler.  We discussed asthma in the risk of being in the heat.  He states he understands.     Douglas Singh was evaluated in Emergency Department on 01/27/2019 for the symptoms described in the history of present illness. He was evaluated in the context of the global COVID-19 pandemic, which necessitated consideration that the patient might be at risk for infection with the SARS-CoV-2 virus that causes COVID-19. Institutional protocols and algorithms that pertain to the evaluation of patients at risk for COVID-19 are in a state of rapid change based on information released by regulatory bodies including the CDC and federal and state organizations. These policies and algorithms were followed during the patient's care in the ED.   As part of my medical decision making, I reviewed the following data within the electronic MEDICAL RECORD NUMBER Nursing notes reviewed and incorporated, Old chart reviewed, Notes from prior ED visits and [place] Controlled Substance Database   ____________________________________________   FINAL CLINICAL IMPRESSION(S) / ED DIAGNOSES  Final diagnoses:  Mild intermittent asthma with exacerbation      NEW MEDICATIONS STARTED DURING THIS VISIT:  New Prescriptions   PREDNISONE (STERAPRED UNI-PAK 21 TAB) 10 MG (21) TBPK TABLET    Take 6 pills on day one then decrease by 1 pill each day     Note:  This document was prepared using Dragon voice recognition software and may include unintentional dictation errors.    Faythe Ghee, PA-C 01/27/19 2637    Arnaldo Natal, MD 01/27/19 [phone]

## 2019-01-27 NOTE — ED Notes (Signed)
Pt observed by this RN outside standing, speaking on the phone, speaking in complete sentences, no distress noted.

## 2019-01-27 NOTE — Discharge Instructions (Signed)
Follow-up with your regular doctor or the acute care if not better in 3 days.  Return to the emergency department worsening.  Signs and symptoms of coronavirus or fever, chills, body aches, cough and congestion, and sometimes abdominal pain.  This symptoms you are having are typical of your asthma exasperation.

## 2019-03-13 ENCOUNTER — Ambulatory Visit: Payer: Self-pay

## 2019-05-23 ENCOUNTER — Encounter: Payer: Self-pay | Admitting: *Deleted

## 2019-05-23 ENCOUNTER — Emergency Department: Payer: Self-pay

## 2019-05-23 ENCOUNTER — Other Ambulatory Visit: Payer: Self-pay

## 2019-05-23 ENCOUNTER — Emergency Department
Admission: EM | Admit: 2019-05-23 | Discharge: 2019-05-23 | Disposition: A | Discharge status: Home / Self Care | Payer: Self-pay | Attending: Student | Admitting: Student

## 2019-05-23 DIAGNOSIS — M545 Low back pain, unspecified: Secondary | ICD-10-CM

## 2019-05-23 DIAGNOSIS — Z79899 Other long term (current) drug therapy: Secondary | ICD-10-CM | POA: Insufficient documentation

## 2019-05-23 DIAGNOSIS — J45909 Unspecified asthma, uncomplicated: Secondary | ICD-10-CM | POA: Insufficient documentation

## 2019-05-23 DIAGNOSIS — F17210 Nicotine dependence, cigarettes, uncomplicated: Secondary | ICD-10-CM | POA: Insufficient documentation

## 2019-05-23 LAB — URINALYSIS, COMPLETE (UACMP) WITH MICROSCOPIC
Bacteria, UA: NONE SEEN
Bilirubin Urine: NEGATIVE
Glucose, UA: NEGATIVE mg/dL
Hgb urine dipstick: NEGATIVE
Ketones, ur: NEGATIVE mg/dL
Leukocytes,Ua: NEGATIVE
Nitrite: NEGATIVE
Protein, ur: NEGATIVE mg/dL
Specific Gravity, Urine: 1.021 (ref 1.005–1.030)
pH: 5 (ref 5.0–8.0)

## 2019-05-23 MED ORDER — CYCLOBENZAPRINE HCL 5 MG PO TABS: ORAL_TABLET | ORAL | 0 refills | Status: DC

## 2019-05-23 MED ORDER — LIDOCAINE 5 % EX PTCH: 1.0000 | MEDICATED_PATCH | CUTANEOUS | 0 refills | Status: DC

## 2019-05-23 MED ORDER — KETOROLAC TROMETHAMINE 30 MG/ML IJ SOLN
30.0000 mg | Freq: Once | INTRAMUSCULAR | Status: AC
  Administered 2019-05-23: 30 mg via INTRAMUSCULAR
  Filled 2019-05-23: qty 1

## 2019-05-23 MED ORDER — IBUPROFEN 600 MG PO TABS: 600.0000 mg | ORAL_TABLET | Freq: Four times a day (QID) | ORAL | 0 refills | Status: DC | PRN

## 2019-05-23 NOTE — ED Provider Notes (Signed)
[facility] Emergency Department Provider Note  ____________________________________________  Time seen: Approximately 5:22 PM  I have reviewed the triage vital signs and the nursing notes.   HISTORY  Chief Complaint Back Pain    HPI Douglas Singh is a 38 y.o. male that presents emergency department for evaluation of low back discomfort for several weeks.  Pain does not radiate.  Patient states that back feels sore, like he has pulled a muscle.  Pain just has not seemed to improve.  He has been taking Motrin, with relief at the time but pain returns.  No urinary symptoms.  No bowel or bladder dysfunction or saddle anesthesias.  No fever, nausea, vomiting, abdominal pain.   Past Medical History:  Diagnosis Date  . Anxiety   . Asthma   . Panic attacks   . Reflux     Patient Active Problem List   Diagnosis Date Noted  . Panic attack 12/03/2015  . Anxiety 06/16/2014  . Acid reflux 06/16/2014    Past Surgical History:  Procedure Laterality Date  . URETHRA SURGERY  2000?   stricture?    Prior to Admission medications   Medication Sig Start Date End Date Taking? Authorizing Provider  acetic acid-hydrocortisone (VOSOL-HC) OTIC solution Place 3 drops into the left ear 2 (two) times daily. 08/07/18   Enid Derry, PA-C  albuterol (PROVENTIL HFA;VENTOLIN HFA) 108 (90 Base) MCG/ACT inhaler Inhale 2 puffs into the lungs every 6 (six) hours as needed for wheezing or shortness of breath. 05/31/18   Minna Antis, MD  clonazePAM (KLONOPIN) 1 MG tablet Take 1 tablet (1 mg total) by mouth as needed for up to 5 days for anxiety. 03/07/18 03/12/18  Enid Derry, PA-C  cyclobenzaprine (FLEXERIL) 5 MG tablet Take 1-2 tablets 3 times daily as needed 05/23/19   Enid Derry, PA-C  gabapentin (NEURONTIN) 100 MG capsule Take 1 capsule (100 mg total) by mouth 3 (three) times daily. 04/05/18 04/05/19  Loleta Rose, MD  ibuprofen (ADVIL) 600 MG tablet Take 1 tablet (600 mg  total) by mouth every 6 (six) hours as needed. 05/23/19   Enid Derry, PA-C  lidocaine (LIDODERM) 5 % Place 1 patch onto the skin daily. Remove & Discard patch within 12 hours or as directed by MD 05/23/19   Enid Derry, PA-C  predniSONE (STERAPRED UNI-PAK 21 TAB) 10 MG (21) TBPK tablet Take 6 pills on day one then decrease by 1 pill each day 01/27/19   Faythe Ghee, PA-C    Allergies Codeine  Family History  Problem Relation Age of Onset  . Bladder Cancer Neg Hx   . Kidney cancer Neg Hx   . Prostate cancer Neg Hx     Social History Social History   Tobacco Use  . Smoking status: Current Every Day Smoker    Packs/day: 0.50    Types: Cigarettes  . Smokeless tobacco: Never Used  Substance Use Topics  . Alcohol use: Yes    Comment: socially on weekends  . Drug use: No     Review of Systems  Constitutional: No fever/chills Cardiovascular: No chest pain. Respiratory: No SOB. Gastrointestinal: No abdominal pain.  No nausea, no vomiting.  Genitourinary: Negative for dysuria. Musculoskeletal: Positive for back pain. Skin: Negative for rash, abrasions, lacerations, ecchymosis. Neurological: Negative for numbness or tingling   ____________________________________________   PHYSICAL EXAM:  VITAL SIGNS: ED Triage Vitals [05/23/19 1618]  Enc Vitals Group     BP 132/90     Pulse Rate 74  Resp 20     Temp 99.1 F (37.3 C)     Temp Source Oral     SpO2 99 %     Weight      Height      Head Circumference      Peak Flow      Pain Score 7     Pain Loc      Pain Edu?      Excl. in [place]?      Constitutional: Alert and oriented. Well appearing and in no acute distress. Eyes: Conjunctivae are normal. PERRL. EOMI. Head: Atraumatic. ENT:      Ears:      Nose: No congestion/rhinnorhea.      Mouth/Throat: Mucous membranes are moist.  Neck: No stridor. Cardiovascular: Normal rate, regular rhythm.  Good peripheral circulation. Respiratory: Normal respiratory  effort without tachypnea or retractions. Lungs CTAB. Good air entry to the bases with no decreased or absent breath sounds.  Gastrointestinal: Bowel sounds 4 quadrants. Soft and nontender to palpation. No guarding or rigidity. No palpable masses. No distention. No CVA tenderness. Musculoskeletal: Full range of motion to all extremities. No gross deformities appreciated.  Mild diffuse tenderness to palpation to lumbar spine and lumbar paraspinal muscles.  Strength equal in lower extremities bilaterally.  Normal gait. Neurologic:  Normal speech and language. No gross focal neurologic deficits are appreciated.  Skin:  Skin is warm, dry and intact. No rash noted. Psychiatric: Mood and affect are normal. Speech and behavior are normal. Patient exhibits appropriate insight and judgement.   ____________________________________________   LABS (all labs ordered are listed, but only abnormal results are displayed)  Labs Reviewed  URINALYSIS, COMPLETE (UACMP) WITH MICROSCOPIC - Abnormal; Notable for the following components:      Result Value   Color, Urine YELLOW (*)    APPearance CLEAR (*)    All other components within normal limits   ____________________________________________  EKG   ____________________________________________  RADIOLOGY Robinette Haines, personally viewed and evaluated these images (plain radiographs) as part of my medical decision making, as well as reviewing the written report by the radiologist.  Dg Lumbar Spine 2-3 Views  Result Date: 05/23/2019 CLINICAL DATA:  Pt reports increasing back pain x 3 weeks. Pt says pain is worse when standing and with twisting movements. Denies any injury or hx of back pain EXAM: LUMBAR SPINE - 2-3 VIEW COMPARISON:  None. FINDINGS: There is no evidence of lumbar spine fracture. Alignment is normal. Intervertebral disc spaces are maintained. IMPRESSION: Negative. Electronically Signed   By: Lajean Manes M.D.   On: 05/23/2019 17:36     ____________________________________________    PROCEDURES  Procedure(s) performed:    Procedures    Medications  ketorolac (TORADOL) 30 MG/ML injection 30 mg (has no administration in time range)     ____________________________________________   INITIAL IMPRESSION / ASSESSMENT AND PLAN / ED COURSE  Pertinent labs & imaging results that were available during my care of the patient were reviewed by me and considered in my medical decision making (see chart for details).  Review of the [place] CSRS was performed in accordance of the [place] prior to dispensing any controlled drugs.     Patient presented emergency department for evaluation of low back pain.  Vital signs and exam are reassuring.  Exam is overall unremarkable.  Patient is walking and moving without any obvious distress or difficulty.  No indication of infection on urinalysis.  X-ray negative for acute bony abnormalities.  IM Toradol  was given for pain.  Patient will be discharged home with prescriptions for Flexeril, Motrin, Lidoderm. Patient is to follow up with Ortho as directed. Patient is given ED precautions to return to the ED for any worsening or new symptoms.  Douglas Singh was evaluated in Emergency Department on 05/23/2019 for the symptoms described in the history of present illness. He was evaluated in the context of the global COVID-19 pandemic, which necessitated consideration that the patient might be at risk for infection with the SARS-CoV-2 virus that causes COVID-19. Institutional protocols and algorithms that pertain to the evaluation of patients at risk for COVID-19 are in a state of rapid change based on information released by regulatory bodies including the CDC and federal and state organizations. These policies and algorithms were followed during the patient's care in the ED.   ____________________________________________  FINAL CLINICAL IMPRESSION(S) / ED DIAGNOSES  Final diagnoses:  Acute  bilateral low back pain without sciatica      NEW MEDICATIONS STARTED DURING THIS VISIT:  ED Discharge Orders         Ordered    cyclobenzaprine (FLEXERIL) 5 MG tablet     05/23/19 1809    ibuprofen (ADVIL) 600 MG tablet  Every 6 hours PRN     05/23/19 1809    lidocaine (LIDODERM) 5 %  Every 24 hours     05/23/19 1810              This chart was dictated using voice recognition software/Dragon. Despite best efforts to proofread, errors can occur which can change the meaning. Any change was purely unintentional.    Enid DerryWagner, Alem Fahl, PA-C 05/23/19 1831    Miguel AschoffMonks, Sarah L., MD 05/24/19 [phone]

## 2019-05-23 NOTE — ED Triage Notes (Signed)
Pt has low back pain for 2 weeks.  No known injury.  Denies urinary sx.   Taking otc meds without relief.  Pt alert.

## 2019-06-19 ENCOUNTER — Emergency Department
Admission: EM | Admit: 2019-06-19 | Discharge: 2019-06-19 | Disposition: A | Discharge status: Home / Self Care | Payer: Self-pay | Attending: Emergency Medicine | Admitting: Emergency Medicine

## 2019-06-19 ENCOUNTER — Emergency Department: Payer: Self-pay

## 2019-06-19 ENCOUNTER — Other Ambulatory Visit: Payer: Self-pay

## 2019-06-19 ENCOUNTER — Encounter: Payer: Self-pay | Admitting: Emergency Medicine

## 2019-06-19 DIAGNOSIS — Z79899 Other long term (current) drug therapy: Secondary | ICD-10-CM | POA: Insufficient documentation

## 2019-06-19 DIAGNOSIS — F17210 Nicotine dependence, cigarettes, uncomplicated: Secondary | ICD-10-CM | POA: Insufficient documentation

## 2019-06-19 DIAGNOSIS — M5441 Lumbago with sciatica, right side: Secondary | ICD-10-CM | POA: Insufficient documentation

## 2019-06-19 DIAGNOSIS — J45909 Unspecified asthma, uncomplicated: Secondary | ICD-10-CM | POA: Insufficient documentation

## 2019-06-19 DIAGNOSIS — M25551 Pain in right hip: Secondary | ICD-10-CM | POA: Insufficient documentation

## 2019-06-19 MED ORDER — PREDNISONE 10 MG PO TABS: ORAL_TABLET | ORAL | 0 refills | Status: DC

## 2019-06-19 NOTE — Discharge Instructions (Addendum)
Follow-up with your primary care provider and make an appoint with Dr. Marry Guan who is the orthopedist on call if any continued problems.  Begin taking prednisone that was sent to your pharmacy.  You will taper down by 1 pill each day for the next 6 days.  You may use ice or heat to your back and hip as needed for discomfort.

## 2019-06-19 NOTE — ED Provider Notes (Signed)
[facility] Emergency Department Provider Note  ____________________________________________   First MD Initiated Contact with Patient 06/19/19 0915     (approximate)  I have reviewed the triage vital signs and the nursing notes.   HISTORY  Chief Complaint Back Pain  HPI Douglas Singh is a 38 y.o. male presents to the ED with complaint of low back pain with radiation into his right hip and upper thigh area.  Patient denies any recent injury.  He was seen on 05/24/2023 back pain.  He states that he took the medication that was prescribed for him at that time with relief of his symptoms until it returned several days ago.  He denies any saddle anesthesias, incontinence of bowel or bladder.  Patient continues to ambulate without any assistance.  He denies any new injuries to his back.  He reports that he has not followed up with an orthopedist as he was instructed for his back.  This is pain as a 10/10.     Past Medical History:  Diagnosis Date  . Anxiety   . Asthma   . Panic attacks   . Reflux     Patient Active Problem List   Diagnosis Date Noted  . Panic attack 12/03/2015  . Anxiety 06/16/2014  . Acid reflux 06/16/2014    Past Surgical History:  Procedure Laterality Date  . URETHRA SURGERY  2000?   stricture?    Prior to Admission medications   Medication Sig Start Date End Date Taking? Authorizing Provider  acetic acid-hydrocortisone (VOSOL-HC) OTIC solution Place 3 drops into the left ear 2 (two) times daily. 08/07/18   Enid Derry, PA-C  albuterol (PROVENTIL HFA;VENTOLIN HFA) 108 (90 Base) MCG/ACT inhaler Inhale 2 puffs into the lungs every 6 (six) hours as needed for wheezing or shortness of breath. 05/31/18   Minna Antis, MD  clonazePAM (KLONOPIN) 1 MG tablet Take 1 tablet (1 mg total) by mouth as needed for up to 5 days for anxiety. 03/07/18 03/12/18  Enid Derry, PA-C  gabapentin (NEURONTIN) 100 MG capsule Take 1 capsule (100 mg  total) by mouth 3 (three) times daily. 04/05/18 04/05/19  Loleta Rose, MD  lidocaine (LIDODERM) 5 % Place 1 patch onto the skin daily. Remove & Discard patch within 12 hours or as directed by MD 05/23/19   Enid Derry, PA-C  predniSONE (DELTASONE) 10 MG tablet Take 6 tablets  today, on day 2 take 5 tablets, day 3 take 4 tablets, day 4 take 3 tablets, day 5 take  2 tablets and 1 tablet the last day 06/19/19   Tommi Rumps, PA-C    Allergies Codeine  Family History  Problem Relation Age of Onset  . Bladder Cancer Neg Hx   . Kidney cancer Neg Hx   . Prostate cancer Neg Hx     Social History Social History   Tobacco Use  . Smoking status: Current Every Day Smoker    Packs/day: 0.50    Types: Cigarettes  . Smokeless tobacco: Never Used  Substance Use Topics  . Alcohol use: Yes    Comment: socially on weekends  . Drug use: No    Review of Systems Constitutional: No fever/chills Cardiovascular: Denies chest pain. Respiratory: Denies shortness of breath. Gastrointestinal: No abdominal pain.  No nausea, no vomiting.  No diarrhea.  Genitourinary: Negative for dysuria.  Negative for hematuria. Musculoskeletal: Positive for low back pain with right leg radiculopathy. Skin: Negative for rash. Neurological: Negative for headaches, focal weakness or numbness. ___________________________________________  PHYSICAL EXAM:  VITAL SIGNS: ED Triage Vitals [06/19/19 0904]  Enc Vitals Group     BP      Pulse      Resp      Temp      Temp src      SpO2      Weight 220 lb (99.8 kg)     Height 5\' 8"  (1.727 m)     Head Circumference      Peak Flow      Pain Score 10     Pain Loc      Pain Edu?      Excl. in GC?    Constitutional: Alert and oriented. Well appearing and in no acute distress. Eyes: Conjunctivae are normal.  Head: Atraumatic. Neck: No stridor.   Cardiovascular: Normal rate, regular rhythm. Grossly normal heart sounds.  Good peripheral circulation. Respiratory:  Normal respiratory effort.  No retractions. Lungs CTAB. Musculoskeletal: Examination of the back there is no gross deformity noted and no point tenderness on palpation of the thoracic or lumbar spine.  There is however moderate tenderness on palpation of the right SI joint area.  Range of motion is without restriction and no muscle spasms were seen.  Patient was seen ambulatory without any assistance.  Good muscle strength bilaterally. Neurologic:  Normal speech and language. No gross focal neurologic deficits are appreciated.  Reflexes 2+ bilaterally.  No gait instability. Skin:  Skin is warm, dry and intact. No rash noted. Psychiatric: Mood and affect are normal. Speech and behavior are normal.  ____________________________________________   LABS (all labs ordered are listed, but only abnormal results are displayed)  Labs Reviewed - No data to display  RADIOLOGY  Official radiology report(s): Dg Hip Unilat W Or Wo Pelvis 2-3 Views Right  Result Date: 06/19/2019 CLINICAL DATA:  Right hip and low back pain for 3-4 days EXAM: DG HIP (WITH OR WITHOUT PELVIS) 2-3V RIGHT COMPARISON:  None. FINDINGS: There is no evidence of hip fracture or dislocation. There is osseous prominence of the superior femoral head neck junction bilaterally as can be seen with femoroacetabular impingement. There is no evidence of arthropathy or other focal bone abnormality. IMPRESSION: No acute osseous injury of the right hip. Electronically Signed   By: Elige KoHetal  Patel   On: 06/19/2019 10:33    ____________________________________________   PROCEDURES  Procedure(s) performed (including Critical Care):  Procedures   ____________________________________________   INITIAL IMPRESSION / ASSESSMENT AND PLAN / ED COURSE  As part of my medical decision making, I reviewed the following data within the electronic MEDICAL RECORD NUMBER Notes from prior ED visits and [place] Controlled Substance Database  38 year old male  presents to the ED with continued back pain now with radiation into his right hip and upper thigh.  Patient was seen on 05/24/2019 at which time x-rays were taken of his lumbar spine and reported as negative.  Patient denies any new injury.  On exam there is moderate tenderness on palpation of the right SI joint area.  Because patient insisted that it was more in his right hip and x-ray was done and reported as negative.  Patient is continue to use his Lidoderm patch as prescribed previously, begin taking prednisone taper starting with 60 mg and use ice to the area as needed.  His again encouraged to follow-up with orthopedics about his back pain.  Patient was also told that he could take Tylenol with his medications.  ____________________________________________   FINAL CLINICAL IMPRESSION(S) / ED DIAGNOSES  Final diagnoses:  Acute right-sided low back pain with right-sided sciatica     ED Discharge Orders         Ordered    predniSONE (DELTASONE) 10 MG tablet     06/19/19 1110           Note:  This document was prepared using Dragon voice recognition software and may include unintentional dictation errors.    Johnn Hai, PA-C 06/19/19 1155    Carrie Mew, MD 06/19/19 1610

## 2019-06-19 NOTE — ED Triage Notes (Signed)
Pt c/o right hip and lower back pain for the past 3-4 days, states he was seen here for the same in the past month.

## 2019-07-23 ENCOUNTER — Other Ambulatory Visit: Payer: Self-pay

## 2019-07-23 DIAGNOSIS — Z20822 Contact with and (suspected) exposure to covid-19: Secondary | ICD-10-CM

## 2019-07-25 LAB — NOVEL CORONAVIRUS, NAA: SARS-CoV-2, NAA: NOT DETECTED

## 2019-08-08 ENCOUNTER — Emergency Department
Admission: EM | Admit: 2019-08-08 | Discharge: 2019-08-08 | Disposition: A | Discharge status: Home / Self Care | Payer: Self-pay | Attending: Emergency Medicine | Admitting: Emergency Medicine

## 2019-08-08 ENCOUNTER — Other Ambulatory Visit: Payer: Self-pay | Admitting: Emergency Medicine

## 2019-08-08 ENCOUNTER — Other Ambulatory Visit: Payer: Self-pay

## 2019-08-08 DIAGNOSIS — K219 Gastro-esophageal reflux disease without esophagitis: Secondary | ICD-10-CM | POA: Insufficient documentation

## 2019-08-08 DIAGNOSIS — Z79899 Other long term (current) drug therapy: Secondary | ICD-10-CM | POA: Insufficient documentation

## 2019-08-08 DIAGNOSIS — J45909 Unspecified asthma, uncomplicated: Secondary | ICD-10-CM | POA: Insufficient documentation

## 2019-08-08 DIAGNOSIS — F17210 Nicotine dependence, cigarettes, uncomplicated: Secondary | ICD-10-CM | POA: Insufficient documentation

## 2019-08-08 MED ORDER — ACETAMINOPHEN 500 MG PO TABS
1000.0000 mg | ORAL_TABLET | Freq: Once | ORAL | Status: AC
  Administered 2019-08-08: 1000 mg via ORAL
  Filled 2019-08-08: qty 2

## 2019-08-08 MED ORDER — SUCRALFATE 1 G PO TABS: 1.0000 g | ORAL_TABLET | Freq: Three times a day (TID) | ORAL | 0 refills | Status: AC

## 2019-08-08 MED ORDER — PANTOPRAZOLE SODIUM 20 MG PO TBEC: 20.0000 mg | DELAYED_RELEASE_TABLET | Freq: Every day | ORAL | 0 refills | Status: DC

## 2019-08-08 MED ORDER — ALUM & MAG HYDROXIDE-SIMETH 200-200-20 MG/5ML PO SUSP
30.0000 mL | Freq: Once | ORAL | Status: AC
  Administered 2019-08-08: 05:00:00 30 mL via ORAL
  Filled 2019-08-08: qty 30

## 2019-08-08 MED ORDER — PANTOPRAZOLE SODIUM 40 MG PO TBEC
40.0000 mg | DELAYED_RELEASE_TABLET | Freq: Once | ORAL | Status: AC
  Administered 2019-08-08: 40 mg via ORAL
  Filled 2019-08-08: qty 1

## 2019-08-08 NOTE — ED Triage Notes (Signed)
See paper chart for computer downtime 

## 2019-08-08 NOTE — ED Notes (Signed)
Pt had troponin, BMP, CBC, and lipase sent during downtime

## 2019-08-08 NOTE — Discharge Instructions (Signed)
Is most likely secondary to acid reflux.  Your labs are reassuring.  Take the Protonix to help decrease acid in the Carafate help on your stomach.  Follow-up with your primary care doctor as needed.  Return to the ER for any other concerns.

## 2019-08-08 NOTE — ED Provider Notes (Signed)
[facility] Emergency Department Provider Note  ____________________________________________   First MD Initiated Contact with Patient 08/08/19 [phone]     (approximate)  I have reviewed the triage vital signs and the nursing notes.   HISTORY  Chief Complaint Acid reflux  HPI Douglas Singh is a 38 y.o. male with anxiety, panic attacks, reflux who comes in with concerns for reflux.  Patient states for the past few days has had worsening acid reflux.  He used to take ranitidine but that after it was discontinued.  Did not take some over-the-counter histamine blocker it sounds like from [employer] that has not been helping.  He also took some Tums as not been helping.  Today the pain woke him up from sleep while he was lying flat so he decided to come into the ER.  The pain was moderate, burning sensation in his throat down his esophagus, constant, worse with laying flat, worse after eating, worse at nighttime.  He has had this previously.  He denies any chest pain or shortness of breath.          Past Medical History:  Diagnosis Date  . Anxiety   . Asthma   . Panic attacks   . Reflux     Patient Active Problem List   Diagnosis Date Noted  . Panic attack 12/03/2015  . Anxiety 06/16/2014  . Acid reflux 06/16/2014    Past Surgical History:  Procedure Laterality Date  . URETHRA SURGERY  2000?   stricture?    Prior to Admission medications   Medication Sig Start Date End Date Taking? Authorizing Provider  acetic acid-hydrocortisone (VOSOL-HC) OTIC solution Place 3 drops into the left ear 2 (two) times daily. 08/07/18   Enid Derry, PA-C  albuterol (PROVENTIL HFA;VENTOLIN HFA) 108 (90 Base) MCG/ACT inhaler Inhale 2 puffs into the lungs every 6 (six) hours as needed for wheezing or shortness of breath. 05/31/18   Minna Antis, MD  clonazePAM (KLONOPIN) 1 MG tablet Take 1 tablet (1 mg total) by mouth as needed for up to 5 days for anxiety. 03/07/18  03/12/18  Enid Derry, PA-C  gabapentin (NEURONTIN) 100 MG capsule Take 1 capsule (100 mg total) by mouth 3 (three) times daily. 04/05/18 04/05/19  Loleta Rose, MD  lidocaine (LIDODERM) 5 % Place 1 patch onto the skin daily. Remove & Discard patch within 12 hours or as directed by MD 05/23/19   Enid Derry, PA-C  predniSONE (DELTASONE) 10 MG tablet Take 6 tablets  today, on day 2 take 5 tablets, day 3 take 4 tablets, day 4 take 3 tablets, day 5 take  2 tablets and 1 tablet the last day 06/19/19   Tommi Rumps, PA-C    Allergies Codeine  Family History  Problem Relation Age of Onset  . Bladder Cancer Neg Hx   . Kidney cancer Neg Hx   . Prostate cancer Neg Hx     Social History Social History   Tobacco Use  . Smoking status: Current Every Day Smoker    Packs/day: 0.50    Types: Cigarettes  . Smokeless tobacco: Never Used  Substance Use Topics  . Alcohol use: Yes    Comment: socially on weekends  . Drug use: No      Review of Systems Constitutional: No fever/chills Eyes: No visual changes. ENT: No sore throat. Cardiovascular: Denies chest pain.  Burning sensation Respiratory: Denies shortness of breath. Gastrointestinal: No abdominal pain.  No nausea, no vomiting.  No diarrhea.  No  constipation. Genitourinary: Negative for dysuria. Musculoskeletal: Negative for back pain. Skin: Negative for rash. Neurological: Negative for headaches, focal weakness or numbness. All other ROS negative ____________________________________________   PHYSICAL EXAM:  VITAL SIGNS: Blood pressure 135/84, pulse 83, SpO2 98 %.  Constitutional: Alert and oriented. Well appearing and in no acute distress. Eyes: Conjunctivae are normal. EOMI. Head: Atraumatic. Nose: No congestion/rhinnorhea. Mouth/Throat: Mucous membranes are moist.   Neck: No stridor. Trachea Midline. FROM Cardiovascular: Normal rate, regular rhythm. Grossly normal heart sounds.  Good peripheral circulation.  Respiratory: Normal respiratory effort.  No retractions. Lungs CTAB. Gastrointestinal: Soft and nontender. No distention. No abdominal bruits.  Musculoskeletal: No lower extremity tenderness nor edema.  No joint effusions. Neurologic:  Normal speech and language. No gross focal neurologic deficits are appreciated.  Skin:  Skin is warm, dry and intact. No rash noted. Psychiatric: Mood and affect are normal. Speech and behavior are normal. GU: Deferred   ____________________________________________   LABS (all labs ordered are listed, but only abnormal results are displayed)  Labs Reviewed - No data to display ____________________________________________   ED ECG REPORT I, Vanessa Hodge, the attending physician, personally viewed and interpreted this ECG.  EKG is normal sinus rate of 93, no ST elevation, no T wave inversions, normal intervals ____________________________________________  INITIAL IMPRESSION / ASSESSMENT AND PLAN / ED COURSE  Douglas Singh was evaluated in Emergency Department on 08/08/2019 for the symptoms described in the history of present illness. He was evaluated in the context of the global COVID-19 pandemic, which necessitated consideration that the patient might be at risk for infection with the SARS-CoV-2 virus that causes COVID-19. Institutional protocols and algorithms that pertain to the evaluation of patients at risk for COVID-19 are in a state of rapid change based on information released by regulatory bodies including the CDC and federal and state organizations. These policies and algorithms were followed during the patient's care in the ED.    Patient is a very well-appearing 38 year old who presents with burning sensation in his throat down his esophagus.  Will get labs evaluate for cholecystitis pancreatitis, ACS, electrolyte abnormalities.  However suspect this is most likely secondary to acid reflux.  Will give GI cocktail and PPI.  Due to downtime labs  appeared to be on the fax.  His CMP was normal except for total protein of 8.4.  White count was normal.  Lipase is normal.  Troponin was 3.  Patient symptoms resolved after GI cocktail.  We will send patient home on a PPI and Carafate.  Patient to follow-up with PCP as needed.  I discussed the provisional nature of ED diagnosis, the treatment so far, the ongoing plan of care, follow up appointments and return precautions with the patient and any family or support people present. They expressed understanding and agreed with the plan, discharged home.   ____________________________________________   FINAL CLINICAL IMPRESSION(S) / ED DIAGNOSES   Final diagnoses:  Gastroesophageal reflux disease, unspecified whether esophagitis present      MEDICATIONS GIVEN DURING THIS VISIT:  Medications  alum & mag hydroxide-simeth (MAALOX/MYLANTA) 200-200-20 MG/5ML suspension 30 mL (30 mLs Oral Given 08/08/19 0517)  acetaminophen (TYLENOL) tablet 1,000 mg (1,000 mg Oral Given 08/08/19 0517)  pantoprazole (PROTONIX) EC tablet 40 mg (40 mg Oral Given 08/08/19 0517)     ED Discharge Orders         Ordered    pantoprazole (PROTONIX) 20 MG tablet  Daily     08/08/19 0533    sucralfate (CARAFATE)  1 g tablet  3 times daily with meals & bedtime     08/08/19 0533           Note:  This document was prepared using Dragon voice recognition software and may include unintentional dictation errors.   Concha SeFunke, Panzy Bubeck E, MD 08/08/19 [phone]

## 2019-08-13 ENCOUNTER — Other Ambulatory Visit: Payer: Self-pay

## 2019-08-13 DIAGNOSIS — R6883 Chills (without fever): Secondary | ICD-10-CM | POA: Insufficient documentation

## 2019-08-13 DIAGNOSIS — Z79899 Other long term (current) drug therapy: Secondary | ICD-10-CM | POA: Insufficient documentation

## 2019-08-13 DIAGNOSIS — F17210 Nicotine dependence, cigarettes, uncomplicated: Secondary | ICD-10-CM | POA: Insufficient documentation

## 2019-08-13 DIAGNOSIS — R0602 Shortness of breath: Secondary | ICD-10-CM | POA: Insufficient documentation

## 2019-08-13 DIAGNOSIS — R197 Diarrhea, unspecified: Secondary | ICD-10-CM | POA: Insufficient documentation

## 2019-08-13 DIAGNOSIS — R109 Unspecified abdominal pain: Secondary | ICD-10-CM | POA: Insufficient documentation

## 2019-08-13 DIAGNOSIS — R05 Cough: Secondary | ICD-10-CM | POA: Insufficient documentation

## 2019-08-13 DIAGNOSIS — Z20828 Contact with and (suspected) exposure to other viral communicable diseases: Secondary | ICD-10-CM | POA: Insufficient documentation

## 2019-08-13 DIAGNOSIS — J45909 Unspecified asthma, uncomplicated: Secondary | ICD-10-CM | POA: Insufficient documentation

## 2019-08-13 MED ORDER — SODIUM CHLORIDE 0.9% FLUSH: 3.0000 mL | Freq: Once | INTRAVENOUS | Status: DC

## 2019-08-13 NOTE — ED Notes (Signed)
Pt states he has been exposed to covid and has abd pain, and SOB.

## 2019-08-13 NOTE — ED Triage Notes (Signed)
Pt ambulatory to triage.  Pt has abd pain and diarrhea.  Pt also has sob with a cough.  cig smoker.  covid exposure recently.  Pt alert.

## 2019-08-14 ENCOUNTER — Emergency Department: Payer: Self-pay

## 2019-08-14 ENCOUNTER — Emergency Department
Admission: EM | Admit: 2019-08-14 | Discharge: 2019-08-14 | Disposition: A | Discharge status: Home / Self Care | Payer: Self-pay | Attending: Emergency Medicine | Admitting: Emergency Medicine

## 2019-08-14 ENCOUNTER — Encounter: Payer: Self-pay | Admitting: *Deleted

## 2019-08-14 ENCOUNTER — Other Ambulatory Visit: Payer: Self-pay

## 2019-08-14 DIAGNOSIS — Z20822 Contact with and (suspected) exposure to covid-19: Secondary | ICD-10-CM

## 2019-08-14 DIAGNOSIS — R197 Diarrhea, unspecified: Secondary | ICD-10-CM

## 2019-08-14 LAB — URINALYSIS, COMPLETE (UACMP) WITH MICROSCOPIC
Bacteria, UA: NONE SEEN
Bilirubin Urine: NEGATIVE
Glucose, UA: NEGATIVE mg/dL
Hgb urine dipstick: NEGATIVE
Ketones, ur: NEGATIVE mg/dL
Leukocytes,Ua: NEGATIVE
Nitrite: NEGATIVE
Protein, ur: NEGATIVE mg/dL
Specific Gravity, Urine: 1.017 (ref 1.005–1.030)
pH: 7 (ref 5.0–8.0)

## 2019-08-14 LAB — COMPREHENSIVE METABOLIC PANEL
ALT: 38 U/L (ref 0–44)
AST: 25 U/L (ref 15–41)
Albumin: 4.1 g/dL (ref 3.5–5.0)
Alkaline Phosphatase: 54 U/L (ref 38–126)
Anion gap: 10 (ref 5–15)
BUN: 10 mg/dL (ref 6–20)
CO2: 27 mmol/L (ref 22–32)
Calcium: 9.2 mg/dL (ref 8.9–10.3)
Chloride: 101 mmol/L (ref 98–111)
Creatinine, Ser: 0.92 mg/dL (ref 0.61–1.24)
GFR calc Af Amer: >60 mL/min (ref >60)
GFR calc non Af Amer: >60 mL/min (ref >60)
Glucose, Bld: 106 mg/dL — ABNORMAL HIGH (ref 70–99)
Potassium: 3.6 mmol/L (ref 3.5–5.1)
Sodium: 138 mmol/L (ref 135–145)
Total Bilirubin: 0.5 mg/dL (ref 0.3–1.2)
Total Protein: 7.7 g/dL (ref 6.5–8.1)

## 2019-08-14 LAB — CBC
HCT: 42 % (ref 39.0–52.0)
Hemoglobin: 14.2 g/dL (ref 13.0–17.0)
MCH: 29.3 pg (ref 26.0–34.0)
MCHC: 33.8 g/dL (ref 30.0–36.0)
MCV: 86.8 fL (ref 80.0–100.0)
Platelets: 234 10*3/uL (ref 150–400)
RBC: 4.84 MIL/uL (ref 4.22–5.81)
RDW: 13.5 % (ref 11.5–15.5)
WBC: 7.3 10*3/uL (ref 4.0–10.5)
nRBC: 0 % (ref 0.0–0.2)

## 2019-08-14 LAB — TROPONIN I (HIGH SENSITIVITY): Troponin I (High Sensitivity): 3 ng/L (ref <18)

## 2019-08-14 LAB — SARS CORONAVIRUS 2 (TAT 6-24 HRS): SARS Coronavirus 2: NEGATIVE

## 2019-08-14 LAB — LIPASE, BLOOD: Lipase: 42 U/L (ref 11–51)

## 2019-08-14 MED ORDER — ONDANSETRON 4 MG PO TBDP: 4.0000 mg | ORAL_TABLET | Freq: Three times a day (TID) | ORAL | 0 refills | Status: DC | PRN

## 2019-08-14 NOTE — ED Provider Notes (Signed)
[facility] Emergency Department Provider Note  ____________________________________________  Time seen: Approximately 2:19 AM  I have reviewed the triage vital signs and the nursing notes.   HISTORY  Chief Complaint Abdominal Pain   HPI Douglas Singh is a 38 y.o. male the history of asthma, smoking, reflux who presents for evaluation of abdominal pain and diarrhea.  Patient reports 24 hours of several daily episodes of watery diarrhea and diffuse crampy abdominal pain.  Recent exposure to a Covid positive person.  Has had a very mild productive cough but unable to bring up sputum.  Earlier today had some wheezing however that resolved with his albuterol inhaler.  No current chest pain or shortness of breath.  He has had chills but no fever, no nausea, no vomiting, no sore throat, no loss of taste or smell.   No history of C. difficile or recent antibiotic use.  Past Medical History:  Diagnosis Date  . Anxiety   . Asthma   . Panic attacks   . Reflux     Patient Active Problem List   Diagnosis Date Noted  . Panic attack 12/03/2015  . Anxiety 06/16/2014  . Acid reflux 06/16/2014    Past Surgical History:  Procedure Laterality Date  . URETHRA SURGERY  2000?   stricture?    Prior to Admission medications   Medication Sig Start Date End Date Taking? Authorizing Provider  acetic acid-hydrocortisone (VOSOL-HC) OTIC solution Place 3 drops into the left ear 2 (two) times daily. 08/07/18   Enid Derry, PA-C  albuterol (PROVENTIL HFA;VENTOLIN HFA) 108 (90 Base) MCG/ACT inhaler Inhale 2 puffs into the lungs every 6 (six) hours as needed for wheezing or shortness of breath. 05/31/18   Minna Antis, MD  clonazePAM (KLONOPIN) 1 MG tablet Take 1 tablet (1 mg total) by mouth as needed for up to 5 days for anxiety. 03/07/18 03/12/18  Enid Derry, PA-C  gabapentin (NEURONTIN) 100 MG capsule Take 1 capsule (100 mg total) by mouth 3 (three) times daily. 04/05/18  04/05/19  Loleta Rose, MD  lidocaine (LIDODERM) 5 % Place 1 patch onto the skin daily. Remove & Discard patch within 12 hours or as directed by MD 05/23/19   Enid Derry, PA-C  ondansetron (ZOFRAN ODT) 4 MG disintegrating tablet Take 1 tablet (4 mg total) by mouth every 8 (eight) hours as needed. 08/14/19   Nita Sickle, MD  pantoprazole (PROTONIX) 20 MG tablet Take 1 tablet (20 mg total) by mouth daily. 08/08/19 09/07/19  Concha Se, MD  predniSONE (DELTASONE) 10 MG tablet Take 6 tablets  today, on day 2 take 5 tablets, day 3 take 4 tablets, day 4 take 3 tablets, day 5 take  2 tablets and 1 tablet the last day 06/19/19   Tommi Rumps, PA-C  sucralfate (CARAFATE) 1 g tablet Take 1 tablet (1 g total) by mouth 4 (four) times daily -  with meals and at bedtime. 08/08/19 09/07/19  Concha Se, MD    Allergies Codeine  Family History  Problem Relation Age of Onset  . Bladder Cancer Neg Hx   . Kidney cancer Neg Hx   . Prostate cancer Neg Hx     Social History Social History   Tobacco Use  . Smoking status: Current Every Day Smoker    Packs/day: 0.50    Types: Cigarettes  . Smokeless tobacco: Never Used  Substance Use Topics  . Alcohol use: Yes    Comment: socially on weekends  . Drug  use: No    Review of Systems  Constitutional: Negative for fever. Eyes: Negative for visual changes. ENT: Negative for sore throat. Neck: No neck pain  Cardiovascular: Negative for chest pain. Respiratory: + shortness of breath, wheezing, cough Gastrointestinal: + abdominal pain and diarrhea. No vomiting  Genitourinary: Negative for dysuria. Musculoskeletal: Negative for back pain. Skin: Negative for rash. Neurological: Negative for headaches, weakness or numbness. Psych: No SI or HI  ____________________________________________   PHYSICAL EXAM:  VITAL SIGNS: ED Triage Vitals  Enc Vitals Group     BP 08/13/19 2357 (!) 159/101     Pulse Rate 08/13/19 2357 66     Resp  08/13/19 2357 20     Temp 08/13/19 2357 99.1 F (37.3 C)     Temp Source 08/13/19 2357 Oral     SpO2 08/13/19 2357 99 %     Weight 08/13/19 2358 230 lb (104.3 kg)     Height 08/13/19 2358 5\' 8"  (1.727 m)     Head Circumference --      Peak Flow --      Pain Score 08/13/19 2358 7     Pain Loc --      Pain Edu? --      Excl. in [place]? --     Constitutional: Alert and oriented. Well appearing and in no apparent distress. HEENT:      Head: Normocephalic and atraumatic.         Eyes: Conjunctivae are normal. Sclera is non-icteric.       Mouth/Throat: Mucous membranes are moist.       Neck: Supple with no signs of meningismus. Cardiovascular: Regular rate and rhythm. No murmurs, gallops, or rubs. 2+ symmetrical distal pulses are present in all extremities. No JVD. Respiratory: Normal respiratory effort. Lungs are clear to auscultation bilaterally. No wheezes, crackles, or rhonchi.  Gastrointestinal: Soft, non tender, and non distended with positive bowel sounds. No rebound or guarding. Genitourinary: No CVA tenderness. Musculoskeletal: Nontender with normal range of motion in all extremities. No edema, cyanosis, or erythema of extremities. Neurologic: Normal speech and language. Face is symmetric. Moving all extremities. No gross focal neurologic deficits are appreciated. Skin: Skin is warm, dry and intact. No rash noted. Psychiatric: Mood and affect are normal. Speech and behavior are normal.  ____________________________________________   LABS (all labs ordered are listed, but only abnormal results are displayed)  Labs Reviewed  COMPREHENSIVE METABOLIC PANEL - Abnormal; Notable for the following components:      Result Value   Glucose, Bld 106 (*)    All other components within normal limits  URINALYSIS, COMPLETE (UACMP) WITH MICROSCOPIC - Abnormal; Notable for the following components:   Color, Urine YELLOW (*)    APPearance CLEAR (*)    All other components within normal limits    SARS CORONAVIRUS 2 (TAT 6-24 HRS)  LIPASE, BLOOD  CBC  TROPONIN I (HIGH SENSITIVITY)   ____________________________________________  EKG  ED ECG REPORT I, Nita Sicklearolina Eriberto Felch, the attending physician, personally viewed and interpreted this ECG.  Normal sinus rhythm, rate of 76, normal intervals, normal axis, no ST elevations or depressions.  Normal EKG. ____________________________________________  RADIOLOGY  I have personally reviewed the images performed during this visit and I agree with the Radiologist's read.   Interpretation by Radiologist:  DG Chest Portable 1 View  Result Date: 08/14/2019 CLINICAL DATA:  Initial evaluation for acute cough, shortness of breath. History of smoking. EXAM: PORTABLE CHEST 1 VIEW COMPARISON:  Prior radiograph from 09/10/2018. FINDINGS:  Cardiac and mediastinal silhouettes are stable in size and contour, and remain within normal limits. Lungs normally inflated. Mild scattered peribronchial thickening. No consolidative opacity. No edema or effusion. No pneumothorax. No acute osseous finding. IMPRESSION: Mild scattered peribronchial thickening, non-specific, and could reflect sequela of reactive airways disease or atypical/viral pneumonitis. Changes related to smoking may also be contributory. No consolidative opacity to suggest pneumonia. Electronically Signed   By: Jeannine Boga M.D.   On: 08/14/2019 02:40      ____________________________________________   PROCEDURES  Procedure(s) performed: None Procedures Critical Care performed:  None ____________________________________________   INITIAL IMPRESSION / ASSESSMENT AND PLAN / ED COURSE  38 y.o. male the history of asthma, smoking, reflux who presents for evaluation of 1 day of cramping diffuse abdominal pain, diarrhea, chills, cough, wheezing, and mild SOB. Recent exposure to COVID.  Patient is extremely well-appearing in no distress, low-grade temp of 99.26F otherwise normal vitals,  normal work of breathing, normal sats at rest and with ambulation, no wheezing and good air movement, no crackles.  Abdomen is soft with mild diffuse tenderness, no rebound or guarding.  Chest x-ray possible viral infection.  EKG and troponin with no signs of myocarditis.  Labs with no significant dehydration or electrolyte derangements.  No leukocytosis or signs of sepsis.  Differential diagnosis viral illness versus gastroenteritis versus Covid.  Patient will be swabbed for Covid.  Discussed quarantine until results are back and quarantine if the results are positive.  Discussed home monitoring of patient's saturations if Covid is positive.  Discussed my standard return precautions.       As part of my medical decision making, I reviewed the following data within the [place] notes reviewed and incorporated, Labs reviewed , EKG interpreted , Old EKG reviewed, Old chart reviewed, Radiograph reviewed , Notes from prior ED visits and  Controlled Substance Database   Please note:  Patient was evaluated in Emergency Department today for the symptoms described in the history of present illness. Patient was evaluated in the context of the global COVID-19 pandemic, which necessitated consideration that the patient might be at risk for infection with the SARS-CoV-2 virus that causes COVID-19. Institutional protocols and algorithms that pertain to the evaluation of patients at risk for COVID-19 are in a state of rapid change based on information released by regulatory bodies including the CDC and federal and state organizations. These policies and algorithms were followed during the patient's care in the ED.  Some ED evaluations and interventions may be delayed as a result of limited staffing during the pandemic.   ____________________________________________   FINAL CLINICAL IMPRESSION(S) / ED DIAGNOSES   Final diagnoses:  Suspected COVID-19 virus infection  Diarrhea of  presumed infectious origin      NEW MEDICATIONS STARTED DURING THIS VISIT:  ED Discharge Orders         Ordered    ondansetron (ZOFRAN ODT) 4 MG disintegrating tablet  Every 8 hours PRN     08/14/19 0252           Note:  This document was prepared using Dragon voice recognition software and may include unintentional dictation errors.    Rudene Re, MD 08/14/19 [phone]

## 2019-08-14 NOTE — Discharge Instructions (Signed)

## 2019-10-07 IMAGING — CR DG CHEST 2V
1 series · 2 of 2 positions shown · non-contrast
Comparison: 04/17/2017

CLINICAL DATA: Asthma.  Shortness of breath.

EXAM:
CHEST  2 VIEW

[Series 1: dg chest 2 view · 0.14mm/px · 2 of 2 slices shown]
[im 1/2]
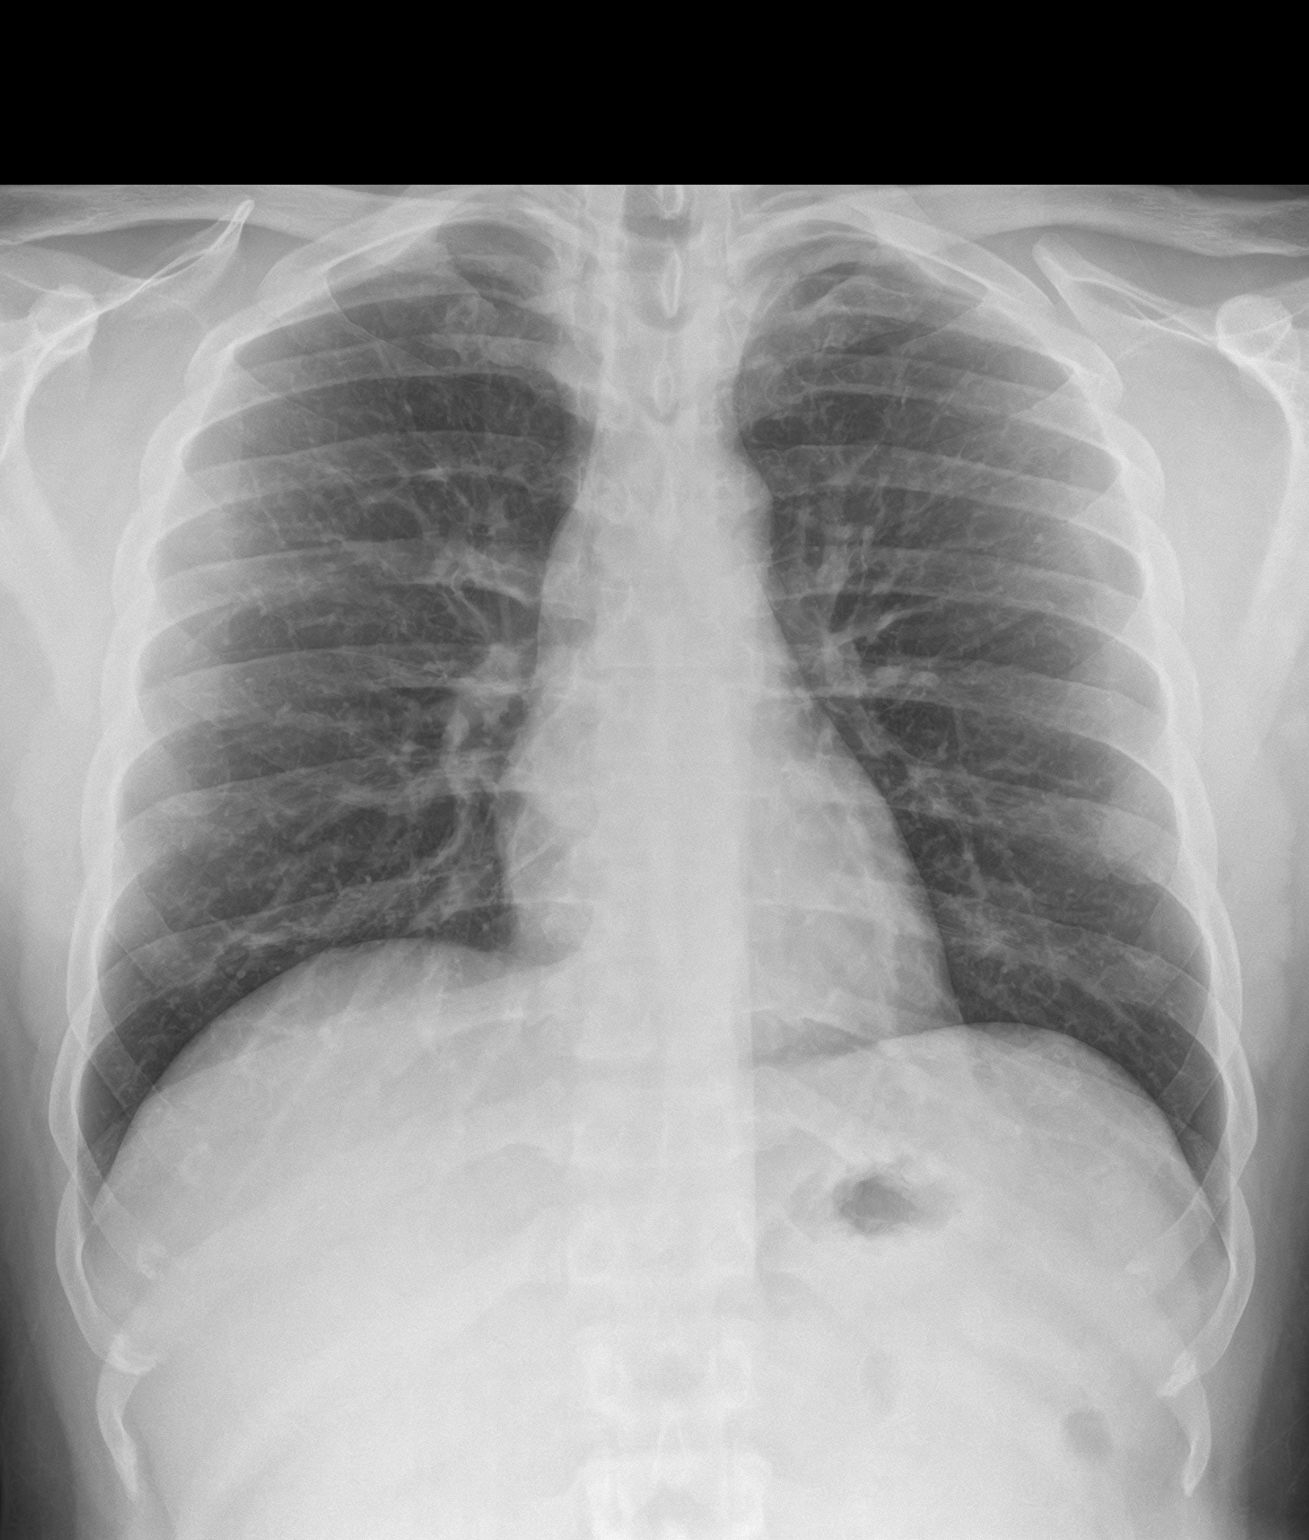
[im 2/2]
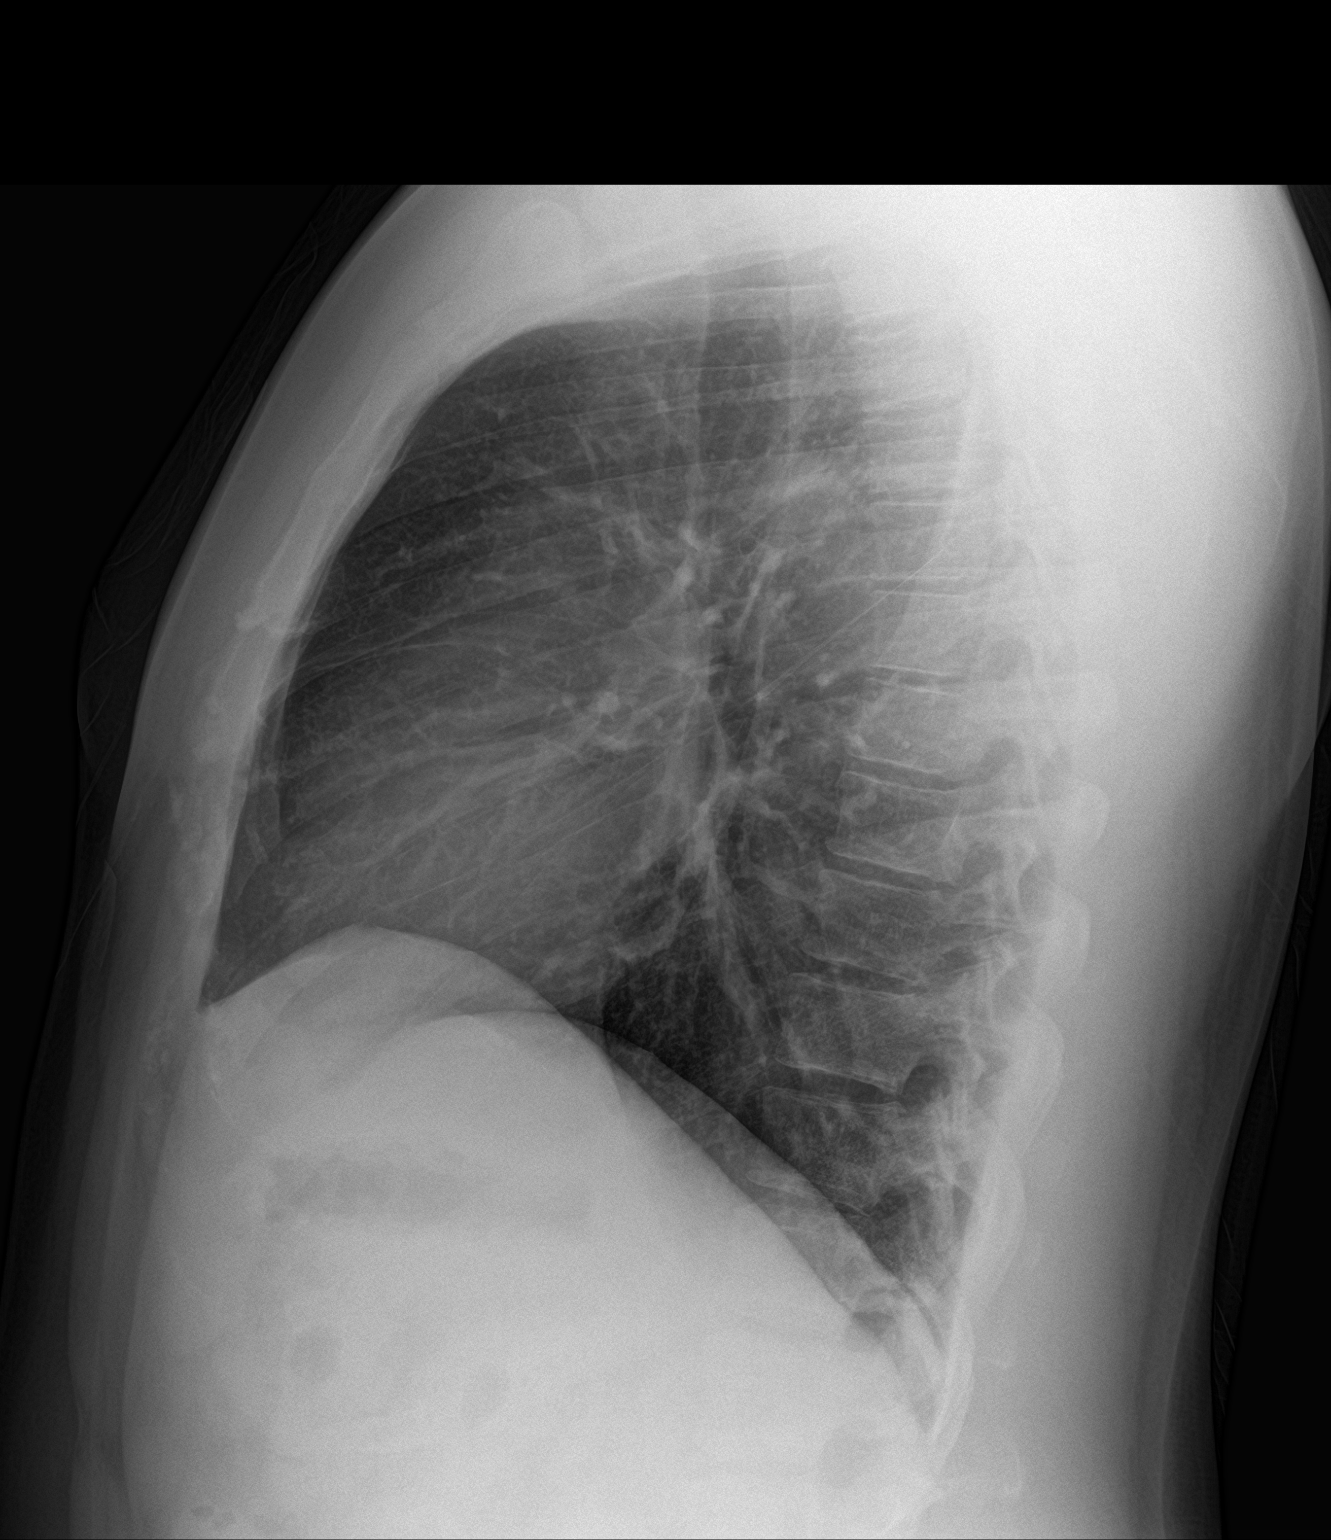

[2 of 2 positions shown; findings below may reference images not displayed]

FINDINGS: Cardiomediastinal silhouette is normal. Mediastinal contours appear
intact.

There is no evidence of focal airspace consolidation, pleural
effusion or pneumothorax.

Osseous structures are without acute abnormality. Soft tissues are
grossly normal.
IMPRESSION: No active cardiopulmonary disease.

## 2019-10-15 ENCOUNTER — Other Ambulatory Visit: Payer: Self-pay

## 2019-12-20 ENCOUNTER — Encounter: Payer: Self-pay | Admitting: Emergency Medicine

## 2019-12-20 ENCOUNTER — Emergency Department: Payer: Self-pay

## 2019-12-20 ENCOUNTER — Other Ambulatory Visit: Payer: Self-pay

## 2019-12-20 DIAGNOSIS — R0789 Other chest pain: Secondary | ICD-10-CM | POA: Insufficient documentation

## 2019-12-20 DIAGNOSIS — F17210 Nicotine dependence, cigarettes, uncomplicated: Secondary | ICD-10-CM | POA: Insufficient documentation

## 2019-12-20 DIAGNOSIS — R0600 Dyspnea, unspecified: Secondary | ICD-10-CM | POA: Insufficient documentation

## 2019-12-20 DIAGNOSIS — K219 Gastro-esophageal reflux disease without esophagitis: Secondary | ICD-10-CM | POA: Insufficient documentation

## 2019-12-20 DIAGNOSIS — J45909 Unspecified asthma, uncomplicated: Secondary | ICD-10-CM | POA: Insufficient documentation

## 2019-12-20 LAB — CBC
HCT: 40.2 % (ref 39.0–52.0)
Hemoglobin: 13.5 g/dL (ref 13.0–17.0)
MCH: 29.4 pg (ref 26.0–34.0)
MCHC: 33.6 g/dL (ref 30.0–36.0)
MCV: 87.6 fL (ref 80.0–100.0)
Platelets: 226 10*3/uL (ref 150–400)
RBC: 4.59 MIL/uL (ref 4.22–5.81)
RDW: 13.7 % (ref 11.5–15.5)
WBC: 6.6 10*3/uL (ref 4.0–10.5)
nRBC: 0 % (ref 0.0–0.2)

## 2019-12-20 NOTE — ED Triage Notes (Signed)
Patient with complaint of shortness of breath and burning in his chest throughout the day. Patient states that he has taken an antacid and his inhaler with no improvement.

## 2019-12-21 ENCOUNTER — Emergency Department
Admission: EM | Admit: 2019-12-21 | Discharge: 2019-12-21 | Disposition: A | Discharge status: Home / Self Care | Payer: Self-pay | Attending: Emergency Medicine | Admitting: Emergency Medicine

## 2019-12-21 ENCOUNTER — Emergency Department: Payer: Self-pay

## 2019-12-21 DIAGNOSIS — R0602 Shortness of breath: Secondary | ICD-10-CM

## 2019-12-21 DIAGNOSIS — K219 Gastro-esophageal reflux disease without esophagitis: Secondary | ICD-10-CM

## 2019-12-21 LAB — BASIC METABOLIC PANEL
Anion gap: 8 (ref 5–15)
BUN: 9 mg/dL (ref 6–20)
CO2: 24 mmol/L (ref 22–32)
Calcium: 9.1 mg/dL (ref 8.9–10.3)
Chloride: 105 mmol/L (ref 98–111)
Creatinine, Ser: 0.85 mg/dL (ref 0.61–1.24)
GFR calc Af Amer: >60 mL/min (ref >60)
GFR calc non Af Amer: >60 mL/min (ref >60)
Glucose, Bld: 102 mg/dL — ABNORMAL HIGH (ref 70–99)
Potassium: 3.8 mmol/L (ref 3.5–5.1)
Sodium: 137 mmol/L (ref 135–145)

## 2019-12-21 LAB — TROPONIN I (HIGH SENSITIVITY): Troponin I (High Sensitivity): <2 ng/L (ref <18)

## 2019-12-21 MED ORDER — PANTOPRAZOLE SODIUM 40 MG PO TBEC: 40.0000 mg | DELAYED_RELEASE_TABLET | Freq: Every day | ORAL | 1 refills | Status: AC

## 2019-12-21 MED ORDER — PANTOPRAZOLE SODIUM 40 MG PO TBEC
40.0000 mg | DELAYED_RELEASE_TABLET | Freq: Once | ORAL | Status: AC
  Administered 2019-12-21: 40 mg via ORAL
  Filled 2019-12-21: qty 1

## 2019-12-21 NOTE — ED Provider Notes (Signed)
[facility] Emergency Department Provider Note  Time seen: 2:22 AM  I have reviewed the triage vital signs and the nursing notes.   HISTORY  Chief Complaint Shortness of Breath    HPI Douglas Singh is a 39 y.o. male with a past medical history of anxiety, asthma, presents to the emergency department for multiple complaints.  According to the patient for the past 2 to 3 days he has been experiencing intermittent episodes of diarrhea, feels like he is having heartburn type symptoms as well with reflux and occasional nasty taste in his mouth.  Also states today he has been feeling short of breath, uses albuterol inhaler but still felt short of breath so he came to the emergency department for evaluation.  Denies any fever.  Denies any shortness of breath at this time.   Past Medical History:  Diagnosis Date  . Anxiety   . Asthma   . Panic attacks   . Reflux     Patient Active Problem List   Diagnosis Date Noted  . Panic attack 12/03/2015  . Anxiety 06/16/2014  . Acid reflux 06/16/2014    Past Surgical History:  Procedure Laterality Date  . URETHRA SURGERY  2000?   stricture?    Prior to Admission medications   Medication Sig Start Date End Date Taking? Authorizing Provider  acetic acid-hydrocortisone (VOSOL-HC) OTIC solution Place 3 drops into the left ear 2 (two) times daily. 08/07/18   Enid Derry, PA-C  albuterol (PROVENTIL HFA;VENTOLIN HFA) 108 (90 Base) MCG/ACT inhaler Inhale 2 puffs into the lungs every 6 (six) hours as needed for wheezing or shortness of breath. 05/31/18   Minna Antis, MD  clonazePAM (KLONOPIN) 1 MG tablet Take 1 tablet (1 mg total) by mouth as needed for up to 5 days for anxiety. 03/07/18 03/12/18  Enid Derry, PA-C  gabapentin (NEURONTIN) 100 MG capsule Take 1 capsule (100 mg total) by mouth 3 (three) times daily. 04/05/18 04/05/19  Loleta Rose, MD  lidocaine (LIDODERM) 5 % Place 1 patch onto the skin daily. Remove &  Discard patch within 12 hours or as directed by MD 05/23/19   Enid Derry, PA-C  ondansetron (ZOFRAN ODT) 4 MG disintegrating tablet Take 1 tablet (4 mg total) by mouth every 8 (eight) hours as needed. 08/14/19   Nita Sickle, MD  pantoprazole (PROTONIX) 20 MG tablet Take 1 tablet (20 mg total) by mouth daily. 08/08/19 09/07/19  Concha Se, MD  predniSONE (DELTASONE) 10 MG tablet Take 6 tablets  today, on day 2 take 5 tablets, day 3 take 4 tablets, day 4 take 3 tablets, day 5 take  2 tablets and 1 tablet the last day 06/19/19   Tommi Rumps, PA-C  sucralfate (CARAFATE) 1 g tablet Take 1 tablet (1 g total) by mouth 4 (four) times daily -  with meals and at bedtime. 08/08/19 09/07/19  Concha Se, MD    Allergies  Allergen Reactions  . Codeine Itching    Family History  Problem Relation Age of Onset  . Bladder Cancer Neg Hx   . Kidney cancer Neg Hx   . Prostate cancer Neg Hx     Social History Social History   Tobacco Use  . Smoking status: Current Every Day Smoker    Packs/day: 0.50    Types: Cigarettes  . Smokeless tobacco: Never Used  Substance Use Topics  . Alcohol use: Yes  . Drug use: No    Review of Systems Constitutional: Negative for  fever. Cardiovascular: Mild burning in his chest which she believes is due to reflux. Respiratory: Mild shortness of breath today.  Negative for cough. Gastrointestinal: Negative for abdominal pain, vomiting  Musculoskeletal: Negative for musculoskeletal complaints Neurological: Negative for headache All other ROS negative  ____________________________________________   PHYSICAL EXAM:  VITAL SIGNS: ED Triage Vitals  Enc Vitals Group     BP 12/20/19 2336 140/80     Pulse Rate 12/20/19 2336 67     Resp 12/20/19 2336 18     Temp 12/20/19 2338 98.5 F (36.9 C)     Temp Source 12/20/19 2338 Oral     SpO2 12/20/19 2336 98 %     Weight 12/20/19 2337 220 lb (99.8 kg)     Height 12/20/19 2337 5\' 9"  (1.753 m)     Head  Circumference --      Peak Flow --      Pain Score 12/20/19 2336 8     Pain Loc --      Pain Edu? --      Excl. in [place]? --    Constitutional: Alert and oriented. Well appearing and in no distress. Eyes: Normal exam ENT      Head: Normocephalic and atraumatic.      Mouth/Throat: Mucous membranes are moist. Cardiovascular: Normal rate, regular rhythm. Respiratory: Normal respiratory effort without tachypnea nor retractions. Breath sounds are clear Gastrointestinal: Soft and nontender. No distention.   Musculoskeletal: Nontender with normal range of motion in all extremities.  Neurologic:  Normal speech and language. No gross focal neurologic deficits  Skin:  Skin is warm, dry and intact.  Psychiatric: Mood and affect are normal.   ____________________________________________    EKG  EKG viewed and interpreted by myself shows a normal sinus rhythm at 62 bpm with a narrow QRS, normal axis, normal intervals, no concerning ST changes.  ____________________________________________    RADIOLOGY  Chest x-ray is negative  ____________________________________________   INITIAL IMPRESSION / ASSESSMENT AND PLAN / ED COURSE  Pertinent labs & imaging results that were available during my care of the patient were reviewed by me and considered in my medical decision making (see chart for details).   Patient presents emergency department for shortness of breath intermittent diarrhea and heartburn per patient.  Overall the patient appears well, reassuring physical exam, clear lung sounds.  Patient's work-up is reassuring as well including a normal-appearing EKG, normal x-ray, normal lab work including a negative troponin.  Discussed results with the patient he is very reassured.  I also discussed placing the patient on Protonix and using over-the-counter liquid Maalox as needed.  Patient agreeable plan of care.  Provided my normal chest pain return precautions.  Douglas Singh was evaluated in  Emergency Department on 12/21/2019 for the symptoms described in the history of present illness. He was evaluated in the context of the global COVID-19 pandemic, which necessitated consideration that the patient might be at risk for infection with the SARS-CoV-2 virus that causes COVID-19. Institutional protocols and algorithms that pertain to the evaluation of patients at risk for COVID-19 are in a state of rapid change based on information released by regulatory bodies including the CDC and federal and state organizations. These policies and algorithms were followed during the patient's care in the ED.  ____________________________________________   FINAL CLINICAL IMPRESSION(S) / ED DIAGNOSES  Chest pain Dyspnea   Harvest Dark, MD 12/21/19 [phone]

## 2019-12-21 NOTE — ED Notes (Signed)
PT reports taking Maylox, peptobismal, famotidine with no relief from chest/throat burning. Also took a prescribed xanax to ease nerves but felt no relief with SOB.

## 2019-12-21 NOTE — ED Notes (Signed)
X-ray tech to ED lobby to get patient but unable to locate.

## 2019-12-22 ENCOUNTER — Encounter: Payer: Self-pay | Admitting: Emergency Medicine

## 2019-12-22 ENCOUNTER — Other Ambulatory Visit: Payer: Self-pay

## 2019-12-22 ENCOUNTER — Emergency Department: Payer: HRSA Program

## 2019-12-22 DIAGNOSIS — Z79899 Other long term (current) drug therapy: Secondary | ICD-10-CM | POA: Insufficient documentation

## 2019-12-22 DIAGNOSIS — U071 COVID-19: Secondary | ICD-10-CM | POA: Insufficient documentation

## 2019-12-22 DIAGNOSIS — R0602 Shortness of breath: Secondary | ICD-10-CM | POA: Diagnosis present

## 2019-12-22 DIAGNOSIS — F17210 Nicotine dependence, cigarettes, uncomplicated: Secondary | ICD-10-CM | POA: Diagnosis not present

## 2019-12-22 LAB — CBC
HCT: 40.7 % (ref 39.0–52.0)
Hemoglobin: 13.7 g/dL (ref 13.0–17.0)
MCH: 29.7 pg (ref 26.0–34.0)
MCHC: 33.7 g/dL (ref 30.0–36.0)
MCV: 88.1 fL (ref 80.0–100.0)
Platelets: 204 10*3/uL (ref 150–400)
RBC: 4.62 MIL/uL (ref 4.22–5.81)
RDW: 13.6 % (ref 11.5–15.5)
WBC: 5.1 10*3/uL (ref 4.0–10.5)
nRBC: 0 % (ref 0.0–0.2)

## 2019-12-22 NOTE — ED Triage Notes (Signed)
Patient with complaint of chest pain, shortness of breath and cough that started this morning.

## 2019-12-23 ENCOUNTER — Emergency Department
Admission: EM | Admit: 2019-12-23 | Discharge: 2019-12-23 | Disposition: A | Discharge status: Home / Self Care | Payer: HRSA Program | Attending: Emergency Medicine | Admitting: Emergency Medicine

## 2019-12-23 DIAGNOSIS — R05 Cough: Secondary | ICD-10-CM

## 2019-12-23 DIAGNOSIS — R059 Cough, unspecified: Secondary | ICD-10-CM

## 2019-12-23 DIAGNOSIS — J029 Acute pharyngitis, unspecified: Secondary | ICD-10-CM

## 2019-12-23 LAB — TROPONIN I (HIGH SENSITIVITY): Troponin I (High Sensitivity): <2 ng/L (ref <18)

## 2019-12-23 LAB — BASIC METABOLIC PANEL
Anion gap: 8 (ref 5–15)
BUN: 13 mg/dL (ref 6–20)
CO2: 26 mmol/L (ref 22–32)
Calcium: 9 mg/dL (ref 8.9–10.3)
Chloride: 103 mmol/L (ref 98–111)
Creatinine, Ser: 1 mg/dL (ref 0.61–1.24)
GFR calc Af Amer: >60 mL/min (ref >60)
GFR calc non Af Amer: >60 mL/min (ref >60)
Glucose, Bld: 82 mg/dL (ref 70–99)
Potassium: 3.3 mmol/L — ABNORMAL LOW (ref 3.5–5.1)
Sodium: 137 mmol/L (ref 135–145)

## 2019-12-23 LAB — SARS CORONAVIRUS 2 (TAT 6-24 HRS): SARS Coronavirus 2: POSITIVE — AB

## 2019-12-23 MED ORDER — BENZONATATE 100 MG PO CAPS
100.0000 mg | ORAL_CAPSULE | Freq: Three times a day (TID) | ORAL | 0 refills | Status: DC | PRN
Start: 2019-12-23 — End: 2021-04-29

## 2019-12-23 NOTE — ED Provider Notes (Signed)
[facility] Emergency Department Provider Note  ____________________________________________   First MD Initiated Contact with Patient 12/23/19 0354     (approximate)  I have reviewed the triage vital signs and the nursing notes.   HISTORY  Chief Complaint Chest Pain and Shortness of Breath    HPI Douglas Singh is a 39 y.o. male well-known to the emergency department for frequent visits for evaluation of shortness of breath, chest pain, and combinations of the two.  He presents tonight for evaluation of cough and mild sore throat. He was last seen 2 to 3 days ago in the ED for approximately the same symptoms but at the time he was also reporting some chest pain and epigastric burning. He was diagnosed with acid reflux and has been taking his medicine. He says that that symptoms better but the sore throat and the mild cough persists. Nothing particular makes it better or worse. He was tested as an outpatient for COVID-19 earlier in the day and he says it was negative. He denies fever, difficulty swallowing, difficulty speaking, abdominal pain, and dysuria. No difficulty with ambulation. He says he just does not understand why he continues to have a cough and a mild sore throat if nothing is wrong.        Past Medical History:  Diagnosis Date  . Anxiety   . Asthma   . Panic attacks   . Reflux     Patient Active Problem List   Diagnosis Date Noted  . Panic attack 12/03/2015  . Anxiety 06/16/2014  . Acid reflux 06/16/2014    Past Surgical History:  Procedure Laterality Date  . URETHRA SURGERY  2000?   stricture?    Prior to Admission medications   Medication Sig Start Date End Date Taking? Authorizing Provider  acetic acid-hydrocortisone (VOSOL-HC) OTIC solution Place 3 drops into the left ear 2 (two) times daily. 08/07/18   Laban Emperor, PA-C  albuterol (PROVENTIL HFA;VENTOLIN HFA) 108 (90 Base) MCG/ACT inhaler Inhale 2 puffs into the lungs  every 6 (six) hours as needed for wheezing or shortness of breath. 05/31/18   Harvest Dark, MD  benzonatate (TESSALON PERLES) 100 MG capsule Take 1 capsule (100 mg total) by mouth 3 (three) times daily as needed for cough. 12/23/19   Hinda Kehr, MD  clonazePAM (KLONOPIN) 1 MG tablet Take 1 tablet (1 mg total) by mouth as needed for up to 5 days for anxiety. 03/07/18 03/12/18  Laban Emperor, PA-C  gabapentin (NEURONTIN) 100 MG capsule Take 1 capsule (100 mg total) by mouth 3 (three) times daily. 04/05/18 04/05/19  Hinda Kehr, MD  lidocaine (LIDODERM) 5 % Place 1 patch onto the skin daily. Remove & Discard patch within 12 hours or as directed by MD 05/23/19   Laban Emperor, PA-C  ondansetron (ZOFRAN ODT) 4 MG disintegrating tablet Take 1 tablet (4 mg total) by mouth every 8 (eight) hours as needed. 08/14/19   Rudene Re, MD  pantoprazole (PROTONIX) 40 MG tablet Take 1 tablet (40 mg total) by mouth daily. 12/21/19 12/20/20  Harvest Dark, MD  predniSONE (DELTASONE) 10 MG tablet Take 6 tablets  today, on day 2 take 5 tablets, day 3 take 4 tablets, day 4 take 3 tablets, day 5 take  2 tablets and 1 tablet the last day 06/19/19   Johnn Hai, PA-C  sucralfate (CARAFATE) 1 g tablet Take 1 tablet (1 g total) by mouth 4 (four) times daily -  with meals and at bedtime. 08/08/19 09/07/19  Concha Se, MD    Allergies Codeine  Family History  Problem Relation Age of Onset  . Bladder Cancer Neg Hx   . Kidney cancer Neg Hx   . Prostate cancer Neg Hx     Social History Social History   Tobacco Use  . Smoking status: Current Every Day Smoker    Packs/day: 0.50    Types: Cigarettes  . Smokeless tobacco: Never Used  Substance Use Topics  . Alcohol use: Yes  . Drug use: No    Review of Systems Constitutional: No fever/chills Eyes: No visual changes. ENT: Mild sore throat. Cardiovascular: Denies chest pain. Respiratory: Cough. Denies shortness of breath. Gastrointestinal: No  abdominal pain.  No nausea, no vomiting.  No diarrhea.  No constipation. Genitourinary: Negative for dysuria. Musculoskeletal: Negative for neck pain.  Negative for back pain. Integumentary: Negative for rash. Neurological: Negative for headaches, focal weakness or numbness.   ____________________________________________   PHYSICAL EXAM:  VITAL SIGNS: ED Triage Vitals  Enc Vitals Group     BP 12/22/19 2319 (!) 149/98     Pulse Rate 12/22/19 2319 85     Resp 12/22/19 2319 18     Temp 12/22/19 2319 98.7 F (37.1 C)     Temp Source 12/22/19 2319 Oral     SpO2 12/22/19 2319 97 %     Weight 12/22/19 2320 99.8 kg (220 lb)     Height 12/22/19 2320 1.753 m (5\' 9" )     Head Circumference --      Peak Flow --      Pain Score 12/22/19 2320 8     Pain Loc --      Pain Edu? --      Excl. in GC? --     Constitutional: Alert and oriented. Well-appearing and in no distress. Eyes: Conjunctivae are normal.  Head: Atraumatic. Nose: No congestion/rhinnorhea. Mouth/Throat: Patient is wearing a mask. Oropharyngeal exam is normal with no erythema, no peritonsillar fullness, no exudate. No evidence of Ludwig's angina. Neck: No stridor.  No meningeal signs. No submandibular swelling or tenderness, no lymphadenopathy. Cardiovascular: Normal rate, regular rhythm. Good peripheral circulation. Grossly normal heart sounds. Respiratory: Normal respiratory effort.  No retractions. Gastrointestinal: Soft and nontender. No distention.  Musculoskeletal: No lower extremity tenderness nor edema. No gross deformities of extremities. Neurologic:  Normal speech and language. No gross focal neurologic deficits are appreciated.  Skin:  Skin is warm, dry and intact. Psychiatric: Mood and affect are anxious but otherwise normal. Speech and behavior are normal.  ____________________________________________   LABS (all labs ordered are listed, but only abnormal results are displayed)  Labs Reviewed  BASIC  METABOLIC PANEL - Abnormal; Notable for the following components:      Result Value   Potassium 3.3 (*)    All other components within normal limits  SARS CORONAVIRUS 2 (TAT 6-24 HRS)  CBC  TROPONIN I (HIGH SENSITIVITY)   ____________________________________________  EKG  ED ECG REPORT I, 12/24/19, the attending physician, personally viewed and interpreted this ECG.  Date: 12/22/2019 EKG Time: 23:23 Rate: 79 Rhythm: normal sinus rhythm QRS Axis: normal Intervals: normal ST/T Wave abnormalities: normal Narrative Interpretation: no evidence of acute ischemia  ____________________________________________  RADIOLOGY I, 12/24/2019, personally viewed and evaluated these images (plain radiographs) as part of my medical decision making, as well as reviewing the written report by the radiologist.  ED MD interpretation:  No acute abnormalities  Official radiology report(s): DG Chest 2 View  Result Date:  12/22/2019 CLINICAL DATA:  Chest pain, short of breath, cough EXAM: CHEST - 2 VIEW COMPARISON:  12/21/2019 FINDINGS: The heart size and mediastinal contours are within normal limits. Both lungs are clear. The visualized skeletal structures are unremarkable. IMPRESSION: No active cardiopulmonary disease. Electronically Signed   By: Sharlet Salina M.D.   On: 12/22/2019 23:36    ____________________________________________   PROCEDURES   Procedure(s) performed (including Critical Care):  Procedures   ____________________________________________   INITIAL IMPRESSION / MDM / ASSESSMENT AND PLAN / ED COURSE  As part of my medical decision making, I reviewed the following data within the electronic MEDICAL RECORD NUMBER Nursing notes reviewed and incorporated, Labs reviewed , EKG interpreted , Old EKG reviewed, Old chart reviewed, Radiograph reviewed , Notes from prior ED visits and [place] Controlled Substance Database   Differential diagnosis includes, but is not limited to,  anxiety and concern over possible medical conditions, nonspecific viral illness, COVID-19, pneumonia.  The patient has been evaluated recently for similar symptoms and diagnosed with acid reflux.  I explained to the patient that acid reflux can cause a persistent cough and sore throat.  His oropharynx is well-appearing and there is no indication for additional testing.  EKG is reassuring with no signs of ischemia, vital signs are stable within normal limits including afebrile, and lab work is generally reassuring as well including a high-sensitivity troponin which is within normal limits and unchanged from his last evaluation several days ago.  He was tested for COVID-19 earlier today but when his nurse mention to him that sometimes the rapid test can have false negatives, he demanded that he received one of the other test.  I think it is reasonable to order it although I have a very low suspicion for COVID-19 in this patient.  He knows that he needs to check my chart within 24 hours or so for the result but there is no indication for hospitalization at this time.  He understands and agrees with the plan.           ____________________________________________  FINAL CLINICAL IMPRESSION(S) / ED DIAGNOSES  Final diagnoses:  Cough  Sore throat     MEDICATIONS GIVEN DURING THIS VISIT:  Medications - No data to display   ED Discharge Orders         Ordered    benzonatate (TESSALON PERLES) 100 MG capsule  3 times daily PRN     12/23/19 0406          *Please note:  Douglas Singh was evaluated in Emergency Department on 12/23/2019 for the symptoms described in the history of present illness. He was evaluated in the context of the global COVID-19 pandemic, which necessitated consideration that the patient might be at risk for infection with the SARS-CoV-2 virus that causes COVID-19. Institutional protocols and algorithms that pertain to the evaluation of patients at risk for COVID-19 are in a  state of rapid change based on information released by regulatory bodies including the CDC and federal and state organizations. These policies and algorithms were followed during the patient's care in the ED.  Some ED evaluations and interventions may be delayed as a result of limited staffing during the pandemic.*  Note:  This document was prepared using Dragon voice recognition software and may include unintentional dictation errors.   Loleta Rose, MD 12/23/19 [phone]

## 2019-12-23 NOTE — Discharge Instructions (Signed)
Your workup in the Emergency Department today was reassuring.  We did not find any specific abnormalities.  We recommend you drink plenty of fluids, take your regular medications and/or any new ones prescribed today, and follow up with the doctor(s) listed in these documents as recommended.  Return to the Emergency Department if you develop new or worsening symptoms that concern you.  

## 2019-12-23 NOTE — ED Notes (Signed)
Ed provider at bedside

## 2019-12-24 ENCOUNTER — Telehealth: Payer: Self-pay | Admitting: Emergency Medicine

## 2019-12-24 ENCOUNTER — Other Ambulatory Visit: Payer: Self-pay | Admitting: Physician Assistant

## 2019-12-24 DIAGNOSIS — J4520 Mild intermittent asthma, uncomplicated: Secondary | ICD-10-CM

## 2019-12-24 DIAGNOSIS — U071 COVID-19: Secondary | ICD-10-CM

## 2019-12-24 MED ORDER — SODIUM CHLORIDE 0.9 % IV SOLN
Freq: Once | INTRAVENOUS | Status: AC
  Filled 2019-12-24: qty 20

## 2019-12-24 NOTE — Telephone Encounter (Signed)
Called patient to assure he is aware of covid positive result.  Left message.

## 2019-12-24 NOTE — Progress Notes (Signed)
  I connected by phone with Unk Pinto on 12/24/2019 at 1:44 PM to discuss the potential use of an new treatment for mild to moderate COVID-19 viral infection in non-hospitalized patients.  This patient is a 39 y.o. male that meets the FDA criteria for Emergency Use Authorization of bamlanivimab/etesevimab or casirivimab/imdevimab.  Has a (+) direct SARS-CoV-2 viral test result  Has mild or moderate COVID-19   Is ? 37 years of age and weighs ? 40 kg  Is NOT hospitalized due to COVID-19  Is NOT requiring oxygen therapy or requiring an increase in baseline oxygen flow rate due to COVID-19  Is within 10 days of symptom onset  Has at least one of the high risk factor(s) for progression to severe COVID-19 and/or hospitalization as defined in EUA.  Specific high risk criteria : BMI >/= 35   Patient reported Height of 5 feet 8 inches and weight of 235lb   I have spoken and communicated the following to the patient or parent/caregiver:  1. FDA has authorized the emergency use of bamlanivimab/etesevimab and casirivimab\imdevimab for the treatment of mild to moderate COVID-19 in adults and pediatric patients with positive results of direct SARS-CoV-2 viral testing who are 82 years of age and older weighing at least 40 kg, and who are at high risk for progressing to severe COVID-19 and/or hospitalization.  2. The significant known and potential risks and benefits of bamlanivimab/etesevimab and casirivimab\imdevimab, and the extent to which such potential risks and benefits are unknown.  3. Information on available alternative treatments and the risks and benefits of those alternatives, including clinical trials.  4. Patients treated with bamlanivimab/etesevimab and casirivimab\imdevimab should continue to self-isolate and use infection control measures (e.g., wear mask, isolate, social distance, avoid sharing personal items, clean and disinfect "high touch" surfaces, and frequent handwashing)  according to CDC guidelines.   5. The patient or parent/caregiver has the option to accept or refuse bamlanivimab/etesevimab or casirivimab\imdevimab .  After reviewing this information with the patient, The patient agreed to proceed with receiving the bamlanimivab infusion and will be provided a copy of the Fact sheet prior to receiving the infusion.Sharrell Ku Adianna Darwin 12/24/2019 1:44 PM

## 2019-12-25 ENCOUNTER — Ambulatory Visit (HOSPITAL_COMMUNITY)
Admission: RE | Admit: 2019-12-25 | Discharge: 2019-12-25 | Disposition: A | Discharge status: Home / Self Care | Payer: HRSA Program | Source: Ambulatory Visit | Attending: Pulmonary Disease | Admitting: Pulmonary Disease

## 2019-12-25 DIAGNOSIS — U071 COVID-19: Secondary | ICD-10-CM | POA: Insufficient documentation

## 2019-12-25 MED ORDER — FAMOTIDINE IN NACL 20-0.9 MG/50ML-% IV SOLN: 20.0000 mg | Freq: Once | INTRAVENOUS | Status: DC | PRN

## 2019-12-25 MED ORDER — EPINEPHRINE 0.3 MG/0.3ML IJ SOAJ: 0.3000 mg | Freq: Once | INTRAMUSCULAR | Status: DC | PRN

## 2019-12-25 MED ORDER — SODIUM CHLORIDE 0.9 % IV SOLN: INTRAVENOUS | Status: DC | PRN

## 2019-12-25 MED ORDER — METHYLPREDNISOLONE SODIUM SUCC 125 MG IJ SOLR: 125.0000 mg | Freq: Once | INTRAMUSCULAR | Status: DC | PRN

## 2019-12-25 MED ORDER — DIPHENHYDRAMINE HCL 50 MG/ML IJ SOLN: 50.0000 mg | Freq: Once | INTRAMUSCULAR | Status: DC | PRN

## 2019-12-25 MED ORDER — ALBUTEROL SULFATE HFA 108 (90 BASE) MCG/ACT IN AERS: 2.0000 | INHALATION_SPRAY | Freq: Once | RESPIRATORY_TRACT | Status: DC | PRN

## 2019-12-25 NOTE — Discharge Instructions (Signed)

## 2019-12-25 NOTE — Progress Notes (Signed)
  Diagnosis: COVID-19  Physician: DR. Delford Field  Procedure: [facility] Med: bamlanivimab\etesevimab infusion - Provided patient with bamlanimivab\etesevimab fact sheet for patients, parents and caregivers prior to infusion.  Complications: No immediate complications noted.  Discharge: Discharged home   Essie Hart 12/25/2019

## 2020-01-16 IMAGING — CR DG CHEST 2V
1 series · 2 of 2 positions shown · non-contrast
Comparison: Chest radiograph August 02, 2017

CLINICAL DATA: Intermittent chest pain and shortness of breath.
Took Klonopin today.

EXAM:
CHEST  2 VIEW

[Series 1: dg chest 2 view · 0.14mm/px · 2 of 2 slices shown]
[im 1/2]
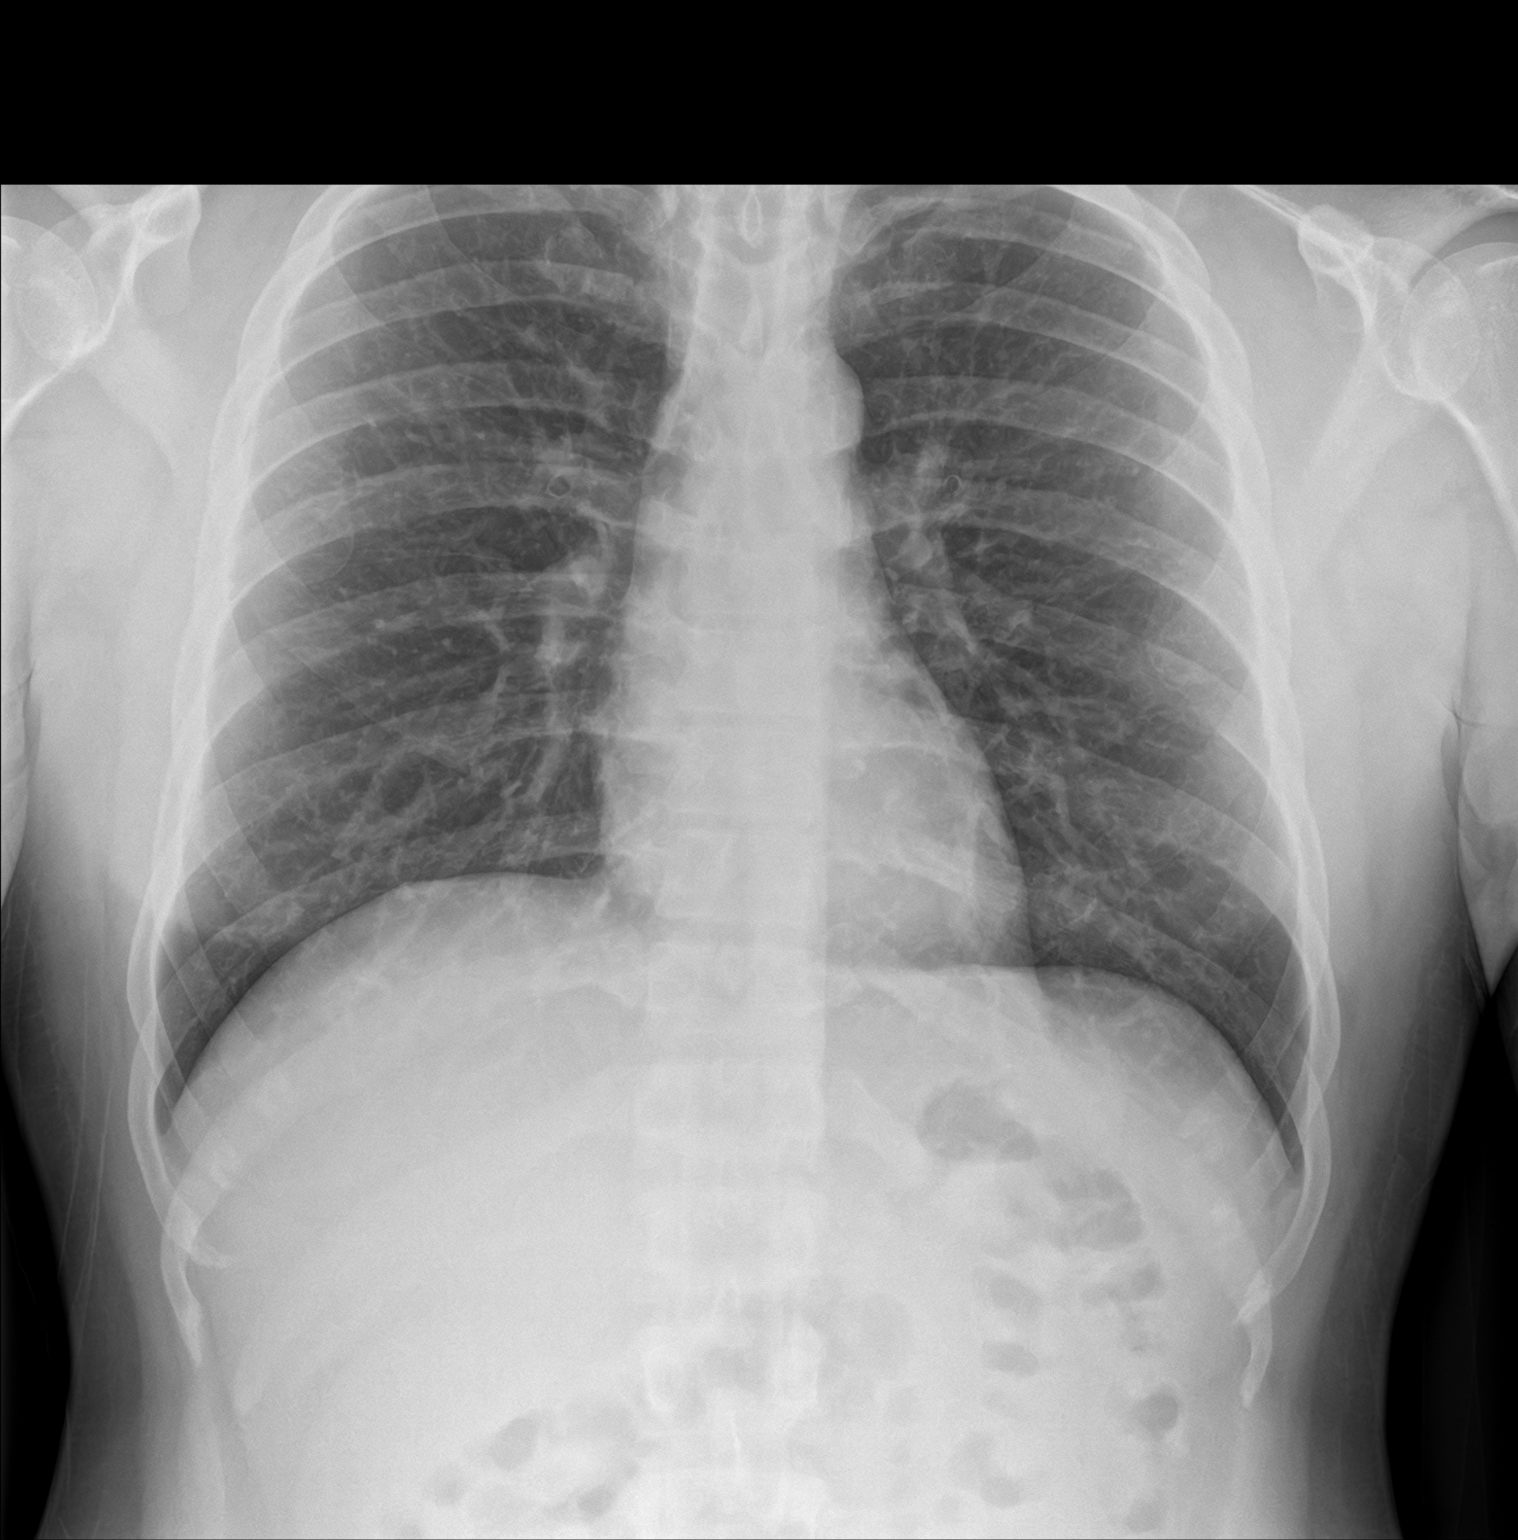
[im 2/2]
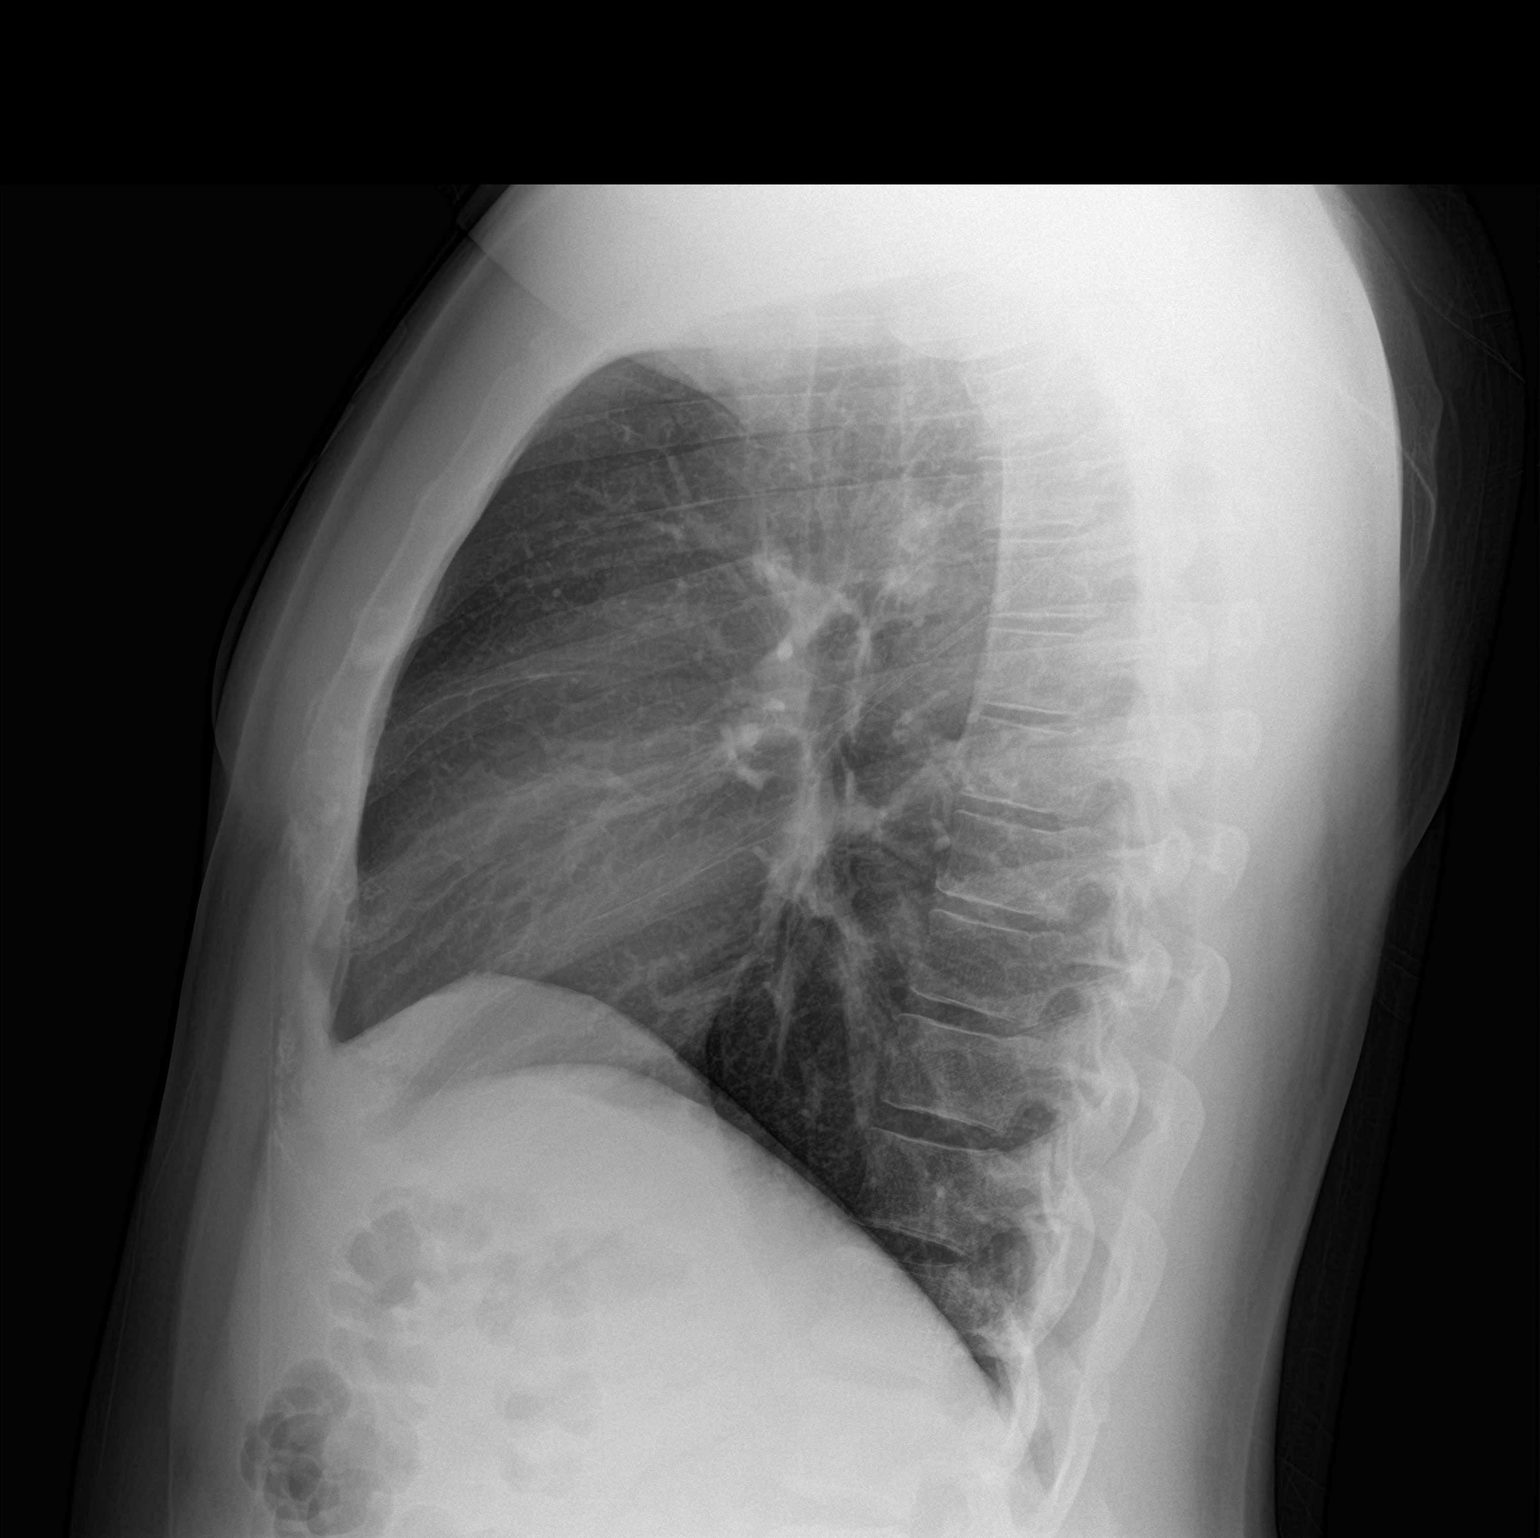

[2 of 2 positions shown; findings below may reference images not displayed]

FINDINGS: Cardiomediastinal silhouette is normal. No pleural effusions or
focal consolidations. Trachea projects midline and there is no
pneumothorax. Soft tissue planes and included osseous structures are
non-suspicious.
IMPRESSION: Negative.

## 2020-02-03 ENCOUNTER — Encounter: Payer: Self-pay | Admitting: Emergency Medicine

## 2020-02-03 ENCOUNTER — Emergency Department: Payer: Self-pay

## 2020-02-03 ENCOUNTER — Other Ambulatory Visit: Payer: Self-pay

## 2020-02-03 DIAGNOSIS — F419 Anxiety disorder, unspecified: Secondary | ICD-10-CM | POA: Insufficient documentation

## 2020-02-03 DIAGNOSIS — Z87891 Personal history of nicotine dependence: Secondary | ICD-10-CM | POA: Insufficient documentation

## 2020-02-03 LAB — CBC WITH DIFFERENTIAL/PLATELET
Abs Immature Granulocytes: 0.04 10*3/uL (ref 0.00–0.07)
Basophils Absolute: 0 10*3/uL (ref 0.0–0.1)
Basophils Relative: 0 %
Eosinophils Absolute: 0.1 10*3/uL (ref 0.0–0.5)
Eosinophils Relative: 2 %
HCT: 43.5 % (ref 39.0–52.0)
Hemoglobin: 14.6 g/dL (ref 13.0–17.0)
Immature Granulocytes: 1 %
Lymphocytes Relative: 27 %
Lymphs Abs: 2.2 10*3/uL (ref 0.7–4.0)
MCH: 29.4 pg (ref 26.0–34.0)
MCHC: 33.6 g/dL (ref 30.0–36.0)
MCV: 87.5 fL (ref 80.0–100.0)
Monocytes Absolute: 0.6 10*3/uL (ref 0.1–1.0)
Monocytes Relative: 7 %
Neutro Abs: 5.3 10*3/uL (ref 1.7–7.7)
Neutrophils Relative %: 63 %
Platelets: 241 10*3/uL (ref 150–400)
RBC: 4.97 MIL/uL (ref 4.22–5.81)
RDW: 14.4 % (ref 11.5–15.5)
WBC: 8.2 10*3/uL (ref 4.0–10.5)
nRBC: 0 % (ref 0.0–0.2)

## 2020-02-03 NOTE — ED Triage Notes (Signed)
Patient ambulatory to triage with steady gait, without difficulty or distress noted, mask in place; pt reports [facility] with chest tightness tonight; st has been off his xanax and took 2 of his friend's tonight at 1015pm without relief; st unsure of it is anxiety

## 2020-02-04 ENCOUNTER — Emergency Department
Admission: EM | Admit: 2020-02-04 | Discharge: 2020-02-04 | Disposition: A | Discharge status: Home / Self Care | Payer: Self-pay | Attending: Emergency Medicine | Admitting: Emergency Medicine

## 2020-02-04 DIAGNOSIS — F419 Anxiety disorder, unspecified: Secondary | ICD-10-CM

## 2020-02-04 DIAGNOSIS — R0602 Shortness of breath: Secondary | ICD-10-CM

## 2020-02-04 LAB — COMPREHENSIVE METABOLIC PANEL
ALT: 38 U/L (ref 0–44)
AST: 22 U/L (ref 15–41)
Albumin: 4 g/dL (ref 3.5–5.0)
Alkaline Phosphatase: 50 U/L (ref 38–126)
Anion gap: 9 (ref 5–15)
BUN: 12 mg/dL (ref 6–20)
CO2: 26 mmol/L (ref 22–32)
Calcium: 9.1 mg/dL (ref 8.9–10.3)
Chloride: 101 mmol/L (ref 98–111)
Creatinine, Ser: 0.82 mg/dL (ref 0.61–1.24)
GFR calc Af Amer: >60 mL/min (ref >60)
GFR calc non Af Amer: >60 mL/min (ref >60)
Glucose, Bld: 111 mg/dL — ABNORMAL HIGH (ref 70–99)
Potassium: 3.7 mmol/L (ref 3.5–5.1)
Sodium: 136 mmol/L (ref 135–145)
Total Bilirubin: 0.6 mg/dL (ref 0.3–1.2)
Total Protein: 7.5 g/dL (ref 6.5–8.1)

## 2020-02-04 LAB — TROPONIN I (HIGH SENSITIVITY)
Troponin I (High Sensitivity): 3 ng/L (ref <18)
Troponin I (High Sensitivity): 3 ng/L (ref <18)

## 2020-02-04 NOTE — Discharge Instructions (Addendum)
Return to the ER for new, worsening, or persistent severe shortness of breath, chest pain, weakness or lightheadedness, worsening anxiety, or any other new or worsening symptoms that concern you.  Follow-up at [facility] as scheduled.

## 2020-02-04 NOTE — ED Provider Notes (Signed)
[name] ____________________________________________   First MD Initiated Contact with Patient 02/04/20 [phone]     (approximate)  I have reviewed the triage vital signs and the nursing notes.   HISTORY  Chief Complaint Shortness of Breath    HPI Douglas Singh is a 39 y.o. male with PMH as noted below including history of asthma and anxiety who presents with shortness of breath over the last few days, intermittent course, associated with palpitations and with a sensation of tightness in the chest.  He denies any significant pain.  He does report feeling anxious.  He states that he used to take Xanax regularly, but has not taken it in the last few days.  Recently he was obtaining it from a friend.  He states that he has chronic problems with anxiety and feels that the shortness of breath is related.  The patient states he has an appointment at [facility] in July to establish care with a new psychiatrist, since it is no longer convenient for him to go to his current psychiatry clinic due to distance.  Past Medical History:  Diagnosis Date  . Anxiety   . Asthma   . Panic attacks   . Reflux     Patient Active Problem List   Diagnosis Date Noted  . Panic attack 12/03/2015  . Anxiety 06/16/2014  . Acid reflux 06/16/2014    Past Surgical History:  Procedure Laterality Date  . URETHRA SURGERY  2000?   stricture?    Prior to Admission medications   Medication Sig Start Date End Date Taking? Authorizing Provider  acetic acid-hydrocortisone (VOSOL-HC) OTIC solution Place 3 drops into the left ear 2 (two) times daily. 08/07/18   Enid Derry, PA-C  albuterol (PROVENTIL HFA;VENTOLIN HFA) 108 (90 Base) MCG/ACT inhaler Inhale 2 puffs into the lungs every 6 (six) hours as needed for wheezing or shortness of breath. 05/31/18   Minna Antis, MD  benzonatate (TESSALON PERLES) 100 MG capsule Take 1 capsule (100 mg total) by mouth 3  (three) times daily as needed for cough. 12/23/19   Loleta Rose, MD  clonazePAM (KLONOPIN) 1 MG tablet Take 1 tablet (1 mg total) by mouth as needed for up to 5 days for anxiety. 03/07/18 03/12/18  Enid Derry, PA-C  gabapentin (NEURONTIN) 100 MG capsule Take 1 capsule (100 mg total) by mouth 3 (three) times daily. 04/05/18 04/05/19  Loleta Rose, MD  lidocaine (LIDODERM) 5 % Place 1 patch onto the skin daily. Remove & Discard patch within 12 hours or as directed by MD 05/23/19   Enid Derry, PA-C  ondansetron (ZOFRAN ODT) 4 MG disintegrating tablet Take 1 tablet (4 mg total) by mouth every 8 (eight) hours as needed. 08/14/19   Nita Sickle, MD  pantoprazole (PROTONIX) 40 MG tablet Take 1 tablet (40 mg total) by mouth daily. 12/21/19 12/20/20  Minna Antis, MD  predniSONE (DELTASONE) 10 MG tablet Take 6 tablets  today, on day 2 take 5 tablets, day 3 take 4 tablets, day 4 take 3 tablets, day 5 take  2 tablets and 1 tablet the last day 06/19/19   Tommi Rumps, PA-C  sucralfate (CARAFATE) 1 g tablet Take 1 tablet (1 g total) by mouth 4 (four) times daily -  with meals and at bedtime. 08/08/19 09/07/19  Concha Se, MD    Allergies Codeine  Family History  Problem Relation Age of Onset  . Bladder Cancer Neg Hx   . Kidney cancer Neg  Hx   . Prostate cancer Neg Hx     Social History Social History   Tobacco Use  . Smoking status: Former Smoker    Packs/day: 0.50    Types: Cigarettes  . Smokeless tobacco: Never Used  Substance Use Topics  . Alcohol use: Not Currently  . Drug use: No    Review of Systems  Constitutional: No fever/chills. Eyes: No visual changes. ENT: No sore throat. Cardiovascular: Denies chest pain. Respiratory: Positive for shortness of breath. Gastrointestinal: No vomiting or diarrhea.  Genitourinary: Negative for flank pain.  Musculoskeletal: Negative for back pain. Skin: Negative for rash. Neurological: Negative for  headaches.   ____________________________________________   PHYSICAL EXAM:  VITAL SIGNS: ED Triage Vitals  Enc Vitals Group     BP 02/03/20 2341 (!) 144/92     Pulse Rate 02/03/20 2341 (!) 59     Resp 02/03/20 2341 18     Temp 02/03/20 2341 98.9 F (37.2 C)     Temp Source 02/03/20 2341 Oral     SpO2 02/03/20 2341 100 %     Weight 02/03/20 2341 225 lb (102.1 kg)     Height 02/03/20 2341 5\' 8"  (1.727 m)     Head Circumference --      Peak Flow --      Pain Score 02/03/20 2347 7     Pain Loc --      Pain Edu? --      Excl. in GC? --     Constitutional: Alert and oriented. Well appearing and in no acute distress. Eyes: Conjunctivae are normal.  Head: Atraumatic. Nose: No congestion/rhinnorhea. Mouth/Throat: Mucous membranes are moist.   Neck: Normal range of motion.  Cardiovascular: Normal rate, regular rhythm. Grossly normal heart sounds.  Good peripheral circulation. Respiratory: Normal respiratory effort.  No retractions. Lungs CTAB. Gastrointestinal: No distention.  Musculoskeletal: Extremities warm and well perfused.  Neurologic:  Normal speech and language. No gross focal neurologic deficits are appreciated.  Skin:  Skin is warm and dry. No rash noted. Psychiatric: Mood and affect are normal. Speech and behavior are normal.  ____________________________________________   LABS (all labs ordered are listed, but only abnormal results are displayed)  Labs Reviewed  COMPREHENSIVE METABOLIC PANEL - Abnormal; Notable for the following components:      Result Value   Glucose, Bld 111 (*)    All other components within normal limits  CBC WITH DIFFERENTIAL/PLATELET  TROPONIN I (HIGH SENSITIVITY)  TROPONIN I (HIGH SENSITIVITY)   ____________________________________________  EKG  ED ECG REPORT I, 04/04/20, the attending physician, personally viewed and interpreted this ECG.  Date: 02/04/2020 EKG Time: 2343 Rate: 59 Rhythm: normal sinus rhythm QRS  Axis: normal Intervals: normal ST/T Wave abnormalities: normal Narrative Interpretation: no evidence of acute ischemia  ____________________________________________  RADIOLOGY  CXR: No focal infiltrate or other acute abnormality  ____________________________________________   PROCEDURES  Procedure(s) performed: No  Procedures  Critical Care performed: No ____________________________________________   INITIAL IMPRESSION / ASSESSMENT AND PLAN / ED COURSE  Pertinent labs & imaging results that were available during my care of the patient were reviewed by me and considered in my medical decision making (see chart for details).  39 year old male with PMH as noted above presents with shortness of breath and anxiety over the last couple of days.  The patient states that he previously was prescribed Xanax, and more recently had to take some that was given to him by a friend.  He has not had any  in the last couple of days and states his anxiety is worse.  I reviewed the past medical records in [place].  The patient has multiple prior ED visits for similar symptoms, most recently in April.  He has no significant cardiac history.  On exam he is overall well-appearing.  His vital signs are normal.  The physical exam is unremarkable.  EKG shows no acute abnormalities.  The patient waited for about 4 hours prior to MD evaluation, and during this time chest x-ray and lab work-up including troponins x2 was obtained, and is all within normal limits.  Presentation is consistent with symptoms related to anxiety.  There is no evidence of acute withdrawal from the alprazolam.  I explained to the patient that we typically do not prescribe benzodiazepines for anxiety from the emergency department especially if this is chronic.  He has an appointment at [facility] in a few weeks already arranged.  He agrees with this plan.  He is stable for discharge at this time.  Return precautions given, and he expresses  understanding.  ____________________________________________   FINAL CLINICAL IMPRESSION(S) / ED DIAGNOSES  Final diagnoses:  Anxiety  Shortness of breath      NEW MEDICATIONS STARTED DURING THIS VISIT:  New Prescriptions   No medications on file     Note:  This document was prepared using Dragon voice recognition software and may include unintentional dictation errors.    Arta Silence, MD 02/04/20 [phone]

## 2020-04-29 ENCOUNTER — Emergency Department
Admission: EM | Admit: 2020-04-29 | Discharge: 2020-04-29 | Disposition: A | Discharge status: Home / Self Care | Payer: Self-pay | Attending: Emergency Medicine | Admitting: Emergency Medicine

## 2020-04-29 ENCOUNTER — Other Ambulatory Visit: Payer: Self-pay

## 2020-04-29 ENCOUNTER — Emergency Department: Payer: Self-pay

## 2020-04-29 ENCOUNTER — Encounter: Payer: Self-pay | Admitting: Emergency Medicine

## 2020-04-29 DIAGNOSIS — J45909 Unspecified asthma, uncomplicated: Secondary | ICD-10-CM | POA: Insufficient documentation

## 2020-04-29 DIAGNOSIS — Z87891 Personal history of nicotine dependence: Secondary | ICD-10-CM | POA: Insufficient documentation

## 2020-04-29 DIAGNOSIS — Z20822 Contact with and (suspected) exposure to covid-19: Secondary | ICD-10-CM | POA: Insufficient documentation

## 2020-04-29 DIAGNOSIS — Z79899 Other long term (current) drug therapy: Secondary | ICD-10-CM | POA: Insufficient documentation

## 2020-04-29 DIAGNOSIS — J069 Acute upper respiratory infection, unspecified: Secondary | ICD-10-CM | POA: Insufficient documentation

## 2020-04-29 LAB — CBC
HCT: 44.1 % (ref 39.0–52.0)
Hemoglobin: 15.2 g/dL (ref 13.0–17.0)
MCH: 29.5 pg (ref 26.0–34.0)
MCHC: 34.5 g/dL (ref 30.0–36.0)
MCV: 85.5 fL (ref 80.0–100.0)
Platelets: 250 10*3/uL (ref 150–400)
RBC: 5.16 MIL/uL (ref 4.22–5.81)
RDW: 13.8 % (ref 11.5–15.5)
WBC: 5.8 10*3/uL (ref 4.0–10.5)
nRBC: 0 % (ref 0.0–0.2)

## 2020-04-29 LAB — TROPONIN I (HIGH SENSITIVITY): Troponin I (High Sensitivity): 3 ng/L (ref <18)

## 2020-04-29 LAB — BASIC METABOLIC PANEL
Anion gap: 12 (ref 5–15)
BUN: 11 mg/dL (ref 6–20)
CO2: 26 mmol/L (ref 22–32)
Calcium: 9.3 mg/dL (ref 8.9–10.3)
Chloride: 99 mmol/L (ref 98–111)
Creatinine, Ser: 0.89 mg/dL (ref 0.61–1.24)
GFR calc Af Amer: >60 mL/min (ref >60)
GFR calc non Af Amer: >60 mL/min (ref >60)
Glucose, Bld: 107 mg/dL — ABNORMAL HIGH (ref 70–99)
Potassium: 3.6 mmol/L (ref 3.5–5.1)
Sodium: 137 mmol/L (ref 135–145)

## 2020-04-29 LAB — SARS CORONAVIRUS 2 BY RT PCR (HOSPITAL ORDER, PERFORMED IN ~~LOC~~ HOSPITAL LAB): SARS Coronavirus 2: NEGATIVE

## 2020-04-29 NOTE — ED Provider Notes (Signed)
[facility] Emergency Department Provider Note   ____________________________________________    I have reviewed the triage vital signs and the nursing notes.   HISTORY  Chief Complaint Chest Pain and Cough     HPI Douglas Singh is a 39 y.o. male with history of anxiety, asthma, panic attacks presents with complaints of chest discomfort and mild cough.  He reports this started yesterday, he reports he is actually feeling somewhat better today but he is concerned because son was recently diagnosed with COVID-19.  Denies fatigue, fevers chills, loss of taste or smell.  He has not been vaccinated.  Past Medical History:  Diagnosis Date  . Anxiety   . Asthma   . Panic attacks   . Reflux     Patient Active Problem List   Diagnosis Date Noted  . Panic attack 12/03/2015  . Anxiety 06/16/2014  . Acid reflux 06/16/2014    Past Surgical History:  Procedure Laterality Date  . URETHRA SURGERY  2000?   stricture?    Prior to Admission medications   Medication Sig Start Date End Date Taking? Authorizing Provider  acetic acid-hydrocortisone (VOSOL-HC) OTIC solution Place 3 drops into the left ear 2 (two) times daily. 08/07/18   Enid Derry, PA-C  albuterol (PROVENTIL HFA;VENTOLIN HFA) 108 (90 Base) MCG/ACT inhaler Inhale 2 puffs into the lungs every 6 (six) hours as needed for wheezing or shortness of breath. 05/31/18   Minna Antis, MD  benzonatate (TESSALON PERLES) 100 MG capsule Take 1 capsule (100 mg total) by mouth 3 (three) times daily as needed for cough. 12/23/19   Loleta Rose, MD  clonazePAM (KLONOPIN) 1 MG tablet Take 1 tablet (1 mg total) by mouth as needed for up to 5 days for anxiety. 03/07/18 03/12/18  Enid Derry, PA-C  gabapentin (NEURONTIN) 100 MG capsule Take 1 capsule (100 mg total) by mouth 3 (three) times daily. 04/05/18 04/05/19  Loleta Rose, MD  lidocaine (LIDODERM) 5 % Place 1 patch onto the skin daily. Remove & Discard  patch within 12 hours or as directed by MD 05/23/19   Enid Derry, PA-C  ondansetron (ZOFRAN ODT) 4 MG disintegrating tablet Take 1 tablet (4 mg total) by mouth every 8 (eight) hours as needed. 08/14/19   Nita Sickle, MD  pantoprazole (PROTONIX) 40 MG tablet Take 1 tablet (40 mg total) by mouth daily. 12/21/19 12/20/20  Minna Antis, MD  predniSONE (DELTASONE) 10 MG tablet Take 6 tablets  today, on day 2 take 5 tablets, day 3 take 4 tablets, day 4 take 3 tablets, day 5 take  2 tablets and 1 tablet the last day 06/19/19   Tommi Rumps, PA-C  sucralfate (CARAFATE) 1 g tablet Take 1 tablet (1 g total) by mouth 4 (four) times daily -  with meals and at bedtime. 08/08/19 09/07/19  Concha Se, MD     Allergies Codeine  Family History  Problem Relation Age of Onset  . Bladder Cancer Neg Hx   . Kidney cancer Neg Hx   . Prostate cancer Neg Hx     Social History Social History   Tobacco Use  . Smoking status: Former Smoker    Packs/day: 0.50    Types: Cigarettes  . Smokeless tobacco: Never Used  Vaping Use  . Vaping Use: Never used  Substance Use Topics  . Alcohol use: Not Currently  . Drug use: No    Review of Systems  Constitutional: No fever/chills Eyes: No visual changes.  ENT:  No sore throat. Cardiovascular: As above Respiratory: Denies shortness of breath.  Cough as above, nonproductive Gastrointestinal: No abdominal pain.   Genitourinary: Negative for dysuria. Musculoskeletal: Negative for back pain. Skin: Negative for rash. Neurological: Negative for headaches    ____________________________________________   PHYSICAL EXAM:  VITAL SIGNS: ED Triage Vitals  Enc Vitals Group     BP 04/29/20 0838 140/79     Pulse Rate 04/29/20 0838 67     Resp 04/29/20 0838 18     Temp 04/29/20 0838 99.1 F (37.3 C)     Temp Source 04/29/20 0838 Oral     SpO2 04/29/20 0838 98 %     Weight 04/29/20 0838 98.9 kg (218 lb)     Height 04/29/20 0838 1.727 m (5\' 8" )      Head Circumference --      Peak Flow --      Pain Score 04/29/20 0852 5     Pain Loc --      Pain Edu? --      Excl. in GC? --     Constitutional: Alert and oriented.   Nose: No congestion/rhinnorhea.  Cardiovascular: Normal rate, regular rhythm. Grossly normal heart sounds.  Good peripheral circulation. Respiratory: Normal respiratory effort.  No retractions. Lungs CTAB.  Reassuring exam Gastrointestinal: Soft and nontender. No distention.  Musculoskeletal: No lower extremity tenderness nor edema.  Warm and well perfused Neurologic:  Normal speech and language. No gross focal neurologic deficits are appreciated.  Skin:  Skin is warm, dry and intact. No rash noted. Psychiatric: Mood and affect are normal. Speech and behavior are normal.  ____________________________________________   LABS (all labs ordered are listed, but only abnormal results are displayed)  Labs Reviewed  BASIC METABOLIC PANEL - Abnormal; Notable for the following components:      Result Value   Glucose, Bld 107 (*)    All other components within normal limits  SARS CORONAVIRUS 2 BY RT PCR (HOSPITAL ORDER, PERFORMED IN [facility])  CBC  TROPONIN I (HIGH SENSITIVITY)   ____________________________________________  EKG  ED ECG REPORT I, 06/29/20, the attending physician, personally viewed and interpreted this ECG.  Date: 04/29/2020  Rhythm: normal sinus rhythm QRS Axis: normal Intervals: normal ST/T Wave abnormalities: normal Narrative Interpretation: no evidence of acute ischemia  ____________________________________________  RADIOLOGY  Chest x-ray viewed by me, no infiltrate effusion or pneumothorax ____________________________________________   PROCEDURES  Procedure(s) performed: No  Procedures   Critical Care performed: No ____________________________________________   INITIAL IMPRESSION / ASSESSMENT AND PLAN / ED COURSE  Pertinent labs & imaging  results that were available during my care of the patient were reviewed by me and considered in my medical decision making (see chart for details).  Patient presents with mild burning sensation in the chest, now resolved, dry cough which seems improved today.  Close exposure to COVID-19 patient.  He has had Covid in the past.  However likely alpha variant.  He actually does have plans to get vaccinated ASAP.  His exam is quite reassuring, no wheezing.  Vitals are reassuring.  Chest x-ray is unremarkable, lab work demonstrates normal cardiac enzymes, normal CBC and BMP  We will send Covid swab, appropriate for discharge at this time with outpatient follow-up      ____________________________________________   FINAL CLINICAL IMPRESSION(S) / ED DIAGNOSES  Final diagnoses:  Upper respiratory tract infection, unspecified type  Contact with and (suspected) exposure to covid-19        Note:  This document was prepared using Dragon voice recognition software and may include unintentional dictation errors.   Jene Every, MD 04/29/20 1534

## 2020-04-29 NOTE — ED Triage Notes (Signed)
Presents with some tightness in chest and cough  Describes pain as"burning" in chest

## 2020-04-29 NOTE — ED Notes (Signed)
Called patient for repeat troponin.  No answer at this time.

## 2020-08-04 ENCOUNTER — Emergency Department
Admission: EM | Admit: 2020-08-04 | Discharge: 2020-08-04 | Disposition: A | Discharge status: Home / Self Care | Payer: Self-pay | Attending: Emergency Medicine | Admitting: Emergency Medicine

## 2020-08-04 ENCOUNTER — Other Ambulatory Visit: Payer: Self-pay

## 2020-08-04 ENCOUNTER — Emergency Department: Payer: Self-pay

## 2020-08-04 ENCOUNTER — Encounter: Payer: Self-pay | Admitting: Emergency Medicine

## 2020-08-04 DIAGNOSIS — J45909 Unspecified asthma, uncomplicated: Secondary | ICD-10-CM | POA: Insufficient documentation

## 2020-08-04 DIAGNOSIS — W500XXA Accidental hit or strike by another person, initial encounter: Secondary | ICD-10-CM | POA: Insufficient documentation

## 2020-08-04 DIAGNOSIS — S0500XA Injury of conjunctiva and corneal abrasion without foreign body, unspecified eye, initial encounter: Secondary | ICD-10-CM | POA: Insufficient documentation

## 2020-08-04 DIAGNOSIS — Z87891 Personal history of nicotine dependence: Secondary | ICD-10-CM | POA: Insufficient documentation

## 2020-08-04 DIAGNOSIS — Z20822 Contact with and (suspected) exposure to covid-19: Secondary | ICD-10-CM | POA: Insufficient documentation

## 2020-08-04 DIAGNOSIS — S0501XA Injury of conjunctiva and corneal abrasion without foreign body, right eye, initial encounter: Secondary | ICD-10-CM

## 2020-08-04 LAB — RESP PANEL BY RT-PCR (FLU A&B, COVID) ARPGX2
Influenza A by PCR: NEGATIVE
Influenza B by PCR: NEGATIVE
SARS Coronavirus 2 by RT PCR: NEGATIVE

## 2020-08-04 MED ORDER — POLYMYXIN B-TRIMETHOPRIM 10000-0.1 UNIT/ML-% OP SOLN: 1.0000 [drp] | OPHTHALMIC | 0 refills | Status: DC

## 2020-08-04 MED ORDER — FLUORESCEIN SODIUM 1 MG OP STRP
1.0000 | ORAL_STRIP | Freq: Once | OPHTHALMIC | Status: AC
  Administered 2020-08-04: 1 via OPHTHALMIC
  Filled 2020-08-04: qty 1

## 2020-08-04 NOTE — ED Notes (Signed)
Pt left before signing.  

## 2020-08-04 NOTE — ED Notes (Signed)
Visual acuity preformed. Findings are as follows. L eye 20/25, R eye 20/30 and bilateral 20/25.
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
## 2020-11-27 ENCOUNTER — Emergency Department
Admission: EM | Admit: 2020-11-27 | Discharge: 2020-11-27 | Disposition: A | Discharge status: Home / Self Care | Payer: Self-pay | Attending: Emergency Medicine | Admitting: Emergency Medicine

## 2020-11-27 ENCOUNTER — Other Ambulatory Visit: Payer: Self-pay

## 2020-11-27 DIAGNOSIS — Z20822 Contact with and (suspected) exposure to covid-19: Secondary | ICD-10-CM | POA: Insufficient documentation

## 2020-11-27 DIAGNOSIS — J302 Other seasonal allergic rhinitis: Secondary | ICD-10-CM

## 2020-11-27 DIAGNOSIS — R0981 Nasal congestion: Secondary | ICD-10-CM | POA: Insufficient documentation

## 2020-11-27 DIAGNOSIS — Z79899 Other long term (current) drug therapy: Secondary | ICD-10-CM | POA: Insufficient documentation

## 2020-11-27 DIAGNOSIS — J45909 Unspecified asthma, uncomplicated: Secondary | ICD-10-CM | POA: Insufficient documentation

## 2020-11-27 DIAGNOSIS — Z87891 Personal history of nicotine dependence: Secondary | ICD-10-CM | POA: Insufficient documentation

## 2020-11-27 DIAGNOSIS — R040 Epistaxis: Secondary | ICD-10-CM | POA: Insufficient documentation

## 2020-11-27 MED ORDER — OXYMETAZOLINE HCL 0.05 % NA SOLN
1.0000 | Freq: Once | NASAL | Status: AC
  Administered 2020-11-27: 1 via NASAL
  Filled 2020-11-27: qty 30

## 2020-11-27 NOTE — ED Provider Notes (Signed)
[facility] Emergency Department Provider Note  ____________________________________________   Event Date/Time   First MD Initiated Contact with Patient 11/27/20 2006     (approximate)  I have reviewed the triage vital signs and the nursing notes.   HISTORY  Chief Complaint Epistaxis  HPI Douglas Singh is a 40 y.o. male who presents to the emergency department for evaluation of right-sided epistaxis that has occurred at various points throughout today.  He states that he has had recent congestion consistent with his usual allergies and has been frequently blowing his nose and that he had a nosebleed that started this morning.  It stopped pretty easily, and then has recurred on 2 separate occasions throughout today.  Nosebleed was always right-sided, never left.  Patient denies any history of similar.  States he does not have any known high blood pressure, is not on any blood thinners.  He denies any headache, fever, chills.  He is requesting Covid testing.       Past Medical History:  Diagnosis Date  . Anxiety   . Asthma   . Panic attacks   . Reflux     Patient Active Problem List   Diagnosis Date Noted  . Panic attack 12/03/2015  . Anxiety 06/16/2014  . Acid reflux 06/16/2014    Past Surgical History:  Procedure Laterality Date  . URETHRA SURGERY  2000?   stricture?    Prior to Admission medications   Medication Sig Start Date End Date Taking? Authorizing Provider  acetic acid-hydrocortisone (VOSOL-HC) OTIC solution Place 3 drops into the left ear 2 (two) times daily. 08/07/18   Enid Derry, PA-C  albuterol (PROVENTIL HFA;VENTOLIN HFA) 108 (90 Base) MCG/ACT inhaler Inhale 2 puffs into the lungs every 6 (six) hours as needed for wheezing or shortness of breath. 05/31/18   Minna Antis, MD  benzonatate (TESSALON PERLES) 100 MG capsule Take 1 capsule (100 mg total) by mouth 3 (three) times daily as needed for cough. 12/23/19   Loleta Rose,  MD  clonazePAM (KLONOPIN) 1 MG tablet Take 1 tablet (1 mg total) by mouth as needed for up to 5 days for anxiety. 03/07/18 03/12/18  Enid Derry, PA-C  gabapentin (NEURONTIN) 100 MG capsule Take 1 capsule (100 mg total) by mouth 3 (three) times daily. 04/05/18 04/05/19  Loleta Rose, MD  lidocaine (LIDODERM) 5 % Place 1 patch onto the skin daily. Remove & Discard patch within 12 hours or as directed by MD 05/23/19   Enid Derry, PA-C  ondansetron (ZOFRAN ODT) 4 MG disintegrating tablet Take 1 tablet (4 mg total) by mouth every 8 (eight) hours as needed. 08/14/19   Nita Sickle, MD  pantoprazole (PROTONIX) 40 MG tablet Take 1 tablet (40 mg total) by mouth daily. 12/21/19 12/20/20  Minna Antis, MD  predniSONE (DELTASONE) 10 MG tablet Take 6 tablets  today, on day 2 take 5 tablets, day 3 take 4 tablets, day 4 take 3 tablets, day 5 take  2 tablets and 1 tablet the last day 06/19/19   Tommi Rumps, PA-C  sucralfate (CARAFATE) 1 g tablet Take 1 tablet (1 g total) by mouth 4 (four) times daily -  with meals and at bedtime. 08/08/19 09/07/19  Concha Se, MD  trimethoprim-polymyxin b (POLYTRIM) ophthalmic solution Place 1 drop into the right eye every 4 (four) hours. 08/04/20   Enid Derry, PA-C    Allergies Codeine  Family History  Problem Relation Age of Onset  . Bladder Cancer Neg Hx   .  Kidney cancer Neg Hx   . Prostate cancer Neg Hx     Social History Social History   Tobacco Use  . Smoking status: Former Smoker    Packs/day: 0.00    Types: Cigarettes  . Smokeless tobacco: Never Used  . Tobacco comment: 2-3 cigarettes/day   Vaping Use  . Vaping Use: Never used  Substance Use Topics  . Alcohol use: Not Currently  . Drug use: No    Review of Systems Constitutional: No fever/chills Eyes: No visual changes. ENT: + Nosebleed, no sore throat. Cardiovascular: Denies chest pain. Respiratory: Denies shortness of breath. Gastrointestinal: No abdominal pain.  No nausea,  no vomiting.  No diarrhea.  No constipation. Genitourinary: Negative for dysuria. Musculoskeletal: Negative for back pain. Skin: Negative for rash. Neurological: Negative for headaches, focal weakness or numbness.  ____________________________________________   PHYSICAL EXAM:  VITAL SIGNS: ED Triage Vitals  Enc Vitals Group     BP 11/27/20 1958 (!) 140/92     Pulse Rate 11/27/20 1958 95     Resp 11/27/20 1958 17     Temp 11/27/20 1958 98.6 F (37 C)     Temp Source 11/27/20 1958 Oral     SpO2 11/27/20 1958 98 %     Weight 11/27/20 1959 220 lb (99.8 kg)     Height 11/27/20 1959 5\' 8"  (1.727 m)     Head Circumference --      Peak Flow --      Pain Score 11/27/20 1958 0     Pain Loc --      Pain Edu? --      Excl. in GC? --    Constitutional: Alert and oriented. Well appearing and in no acute distress. Eyes: Conjunctivae are normal. PERRL. EOMI. Head: Atraumatic. Nose: Turbinates appear normal with no appreciated bogginess.  No polyp, scab or other abnormality identified.  No congestion/rhinnorhea. Mouth/Throat: Mucous membranes are moist.  Oropharynx non-erythematous. Neck: No stridor.   Cardiovascular: Normal rate, regular rhythm. Grossly normal heart sounds.  Good peripheral circulation. Respiratory: Normal respiratory effort.  No retractions. Lungs CTAB. Neurologic:  Normal speech and language. No gross focal neurologic deficits are appreciated. No gait instability. Skin:  Skin is warm, dry and intact. No rash noted. Psychiatric: Mood and affect are normal. Speech and behavior are normal.  ____________________________________________   INITIAL IMPRESSION / ASSESSMENT AND PLAN / ED COURSE  As part of my medical decision making, I reviewed the following data within the electronic MEDICAL RECORD NUMBER Nursing notes reviewed and incorporated and Notes from prior ED visits        Patient is a 40 year old male who presents to the emergency department for evaluation of  right-sided nasal epistaxis that is occurred a few times throughout today, however he reports being able to easily stop the bleeding each time.  See HPI for further details.  In triage, the patient is a very mildly hypertensive at 140/92 but otherwise has normal vital signs.  On physical exam, there is no significant bogginess to the turbinates, and no scab or area of prior bleeding was able to be identified.  Discussed proper epistaxis management with the patient.  Given that he has no active bleeding at this time, there is no indication for nasal packing or other.  I did provide the patient with a treatment of Afrin and allowed him to take the bottle home should this recur.  Discussed not overusing Afrin for risk of causing rebound rhinorrhea.  Patient is also requesting  a Covid test and this was provided to him, however is only going to be completed in the left nare given the patient's complaint from the right.  Patient is stable at this time for outpatient follow-up, and he is amenable with this plan.      ____________________________________________   FINAL CLINICAL IMPRESSION(S) / ED DIAGNOSES  Final diagnoses:  Bleeding nose  Seasonal allergies     ED Discharge Orders    None      *Please note:  Douglas Singh was evaluated in Emergency Department on 11/27/2020 for the symptoms described in the history of present illness. He was evaluated in the context of the global COVID-19 pandemic, which necessitated consideration that the patient might be at risk for infection with the SARS-CoV-2 virus that causes COVID-19. Institutional protocols and algorithms that pertain to the evaluation of patients at risk for COVID-19 are in a state of rapid change based on information released by regulatory bodies including the CDC and federal and state organizations. These policies and algorithms were followed during the patient's care in the ED.  Some ED evaluations and interventions may be delayed as a result  of limited staffing during and the pandemic.*   Note:  This document was prepared using Dragon voice recognition software and may include unintentional dictation errors.   Lucy Chris, PA 11/27/20 2333    Concha Se, MD 11/28/20 [phone]

## 2020-11-27 NOTE — ED Triage Notes (Signed)
Pt presents to ER c/o several nosebleeds that have happened throughout the day.  Pt was at rest when these nosebleeds started.  Pt states his last nosebleed was right before he came in.  Pt does not have any bleeding currently, and denies blood thinners.

## 2020-11-27 NOTE — Discharge Instructions (Signed)
Use Afrin if nosebleed recurs.  Obtain your Covid results from your MyChart.  If any worsening occurs or you are unable to stop the nosebleed after 30 minutes, please return to the emergency department.

## 2020-11-28 LAB — SARS CORONAVIRUS 2 (TAT 6-24 HRS): SARS Coronavirus 2: NEGATIVE

## 2020-12-25 ENCOUNTER — Other Ambulatory Visit: Payer: Self-pay

## 2020-12-25 ENCOUNTER — Ambulatory Visit: Payer: Self-pay | Admitting: Family Medicine

## 2020-12-25 ENCOUNTER — Encounter: Payer: Self-pay | Admitting: Family Medicine

## 2020-12-25 DIAGNOSIS — Z113 Encounter for screening for infections with a predominantly sexual mode of transmission: Secondary | ICD-10-CM

## 2020-12-25 LAB — GRAM STAIN

## 2020-12-25 NOTE — Progress Notes (Signed)
   [facility] Department STI clinic/screening visit  Subjective:  Douglas Singh is a 40 y.o. male being seen today for an STI screening visit. The patient reports they do not have symptoms.    Patient has the following medical conditions:   Patient Active Problem List   Diagnosis Date Noted  . Panic attack 12/03/2015  . Anxiety 06/16/2014  . Acid reflux 06/16/2014     Chief Complaint  Patient presents with  . SEXUALLY TRANSMITTED DISEASE    Screening    HPI  Patient reports here for screening, " I want to be on the safe side"    See flowsheet for further details and programmatic requirements.    The following portions of the patient's history were reviewed and updated as appropriate: allergies, current medications, past medical history, past social history, past surgical history and problem list.  Objective:  There were no vitals filed for this visit.  Physical Exam Constitutional:      Appearance: Normal appearance.  HENT:     Head: Normocephalic.     Mouth/Throat:     Mouth: Mucous membranes are moist.     Pharynx: Oropharynx is clear. No oropharyngeal exudate.  Genitourinary:    Comments: No lice, nits, or pest, no lesions or odor discharge.  Denies pain or tenderness with paplation of testicles.  No lesions, ulcers or masses present.    Musculoskeletal:     Cervical back: Normal range of motion.  Lymphadenopathy:     Cervical: No cervical adenopathy.  Skin:    General: Skin is warm and dry.     Findings: No bruising, erythema, lesion or rash.  Neurological:     Mental Status: He is alert.  Psychiatric:        Mood and Affect: Mood normal.        Behavior: Behavior normal.       Assessment and Plan:  Douglas Singh is a 40 y.o. male presenting to the [facility] Department for STI screening  1. Screening examination for venereal disease  Patient does not have STI symptoms Patient accepted all screenings including  Gram stain,  oral, urethral, rectal CT/GC and bloodwork for HIV/RPR.  Patient meets criteria for HepB screening? No. Ordered? No - does not meet criteria  Patient meets criteria for HepC screening? Yes. Ordered? Yes Recommended condom use with all sex Discussed importance of condom use for STI prevent  Gram stain negative - no treatment needed  Discussed time line for [facility] results and that patient will be called with positive results and encouraged patient to call if he had not heard in 2 weeks Recommended returning for continued or worsening symptoms.  - Gonococcus culture - Gram stain - HIV/HCV [facility] - Syphilis Serology, [facility] - Gonococcus culture    No follow-ups on file.  No future appointments.  Wendi Snipes, FNP

## 2020-12-29 LAB — GONOCOCCUS CULTURE

## 2021-01-04 LAB — HM HIV SCREENING LAB: HM HIV Screening: NEGATIVE

## 2021-01-04 LAB — HM HEPATITIS C SCREENING LAB: HM Hepatitis Screen: NEGATIVE

## 2021-04-29 ENCOUNTER — Emergency Department: Payer: Self-pay

## 2021-04-29 ENCOUNTER — Emergency Department
Admission: EM | Admit: 2021-04-29 | Discharge: 2021-04-29 | Disposition: A | Discharge status: Home / Self Care | Payer: Self-pay | Attending: Emergency Medicine | Admitting: Emergency Medicine

## 2021-04-29 ENCOUNTER — Other Ambulatory Visit: Payer: Self-pay

## 2021-04-29 DIAGNOSIS — R059 Cough, unspecified: Secondary | ICD-10-CM | POA: Insufficient documentation

## 2021-04-29 DIAGNOSIS — R072 Precordial pain: Secondary | ICD-10-CM | POA: Insufficient documentation

## 2021-04-29 DIAGNOSIS — Z87891 Personal history of nicotine dependence: Secondary | ICD-10-CM | POA: Insufficient documentation

## 2021-04-29 DIAGNOSIS — R0789 Other chest pain: Secondary | ICD-10-CM

## 2021-04-29 DIAGNOSIS — J45909 Unspecified asthma, uncomplicated: Secondary | ICD-10-CM | POA: Insufficient documentation

## 2021-04-29 NOTE — Discharge Instructions (Addendum)
Follow up with your regular doctor if not better in 3 to 4 days, return to the ER if worsening Use ibuprofen or tylenol for pain as needed Apply ice after lifting

## 2021-04-29 NOTE — ED Notes (Signed)
See triage note   Presents with soreness to mid chest  States area has been there for 2 months

## 2021-04-29 NOTE — ED Triage Notes (Signed)
Pt c/o a tender swollen area to his chest for the past several days

## 2021-04-29 NOTE — ED Provider Notes (Signed)
[facility] Emergency Department Provider Note  ____________________________________________   Event Date/Time   First MD Initiated Contact with Patient 04/29/21 1231     (approximate)  I have reviewed the triage vital signs and the nursing notes.   HISTORY  Chief Complaint Abscess    HPI Douglas Singh is a 40 y.o. male presents emergency department complaining of pain in the middle of his chest for several months.  Patient lifts heavy boxes at work and has been lifting weights.  States he is also had a cough for approximately 1 year.  No fever or chills.  No shortness of breath  Past Medical History:  Diagnosis Date   Anxiety    Asthma    Panic attacks    Reflux     Patient Active Problem List   Diagnosis Date Noted   Panic attack 12/03/2015   Anxiety 06/16/2014   Acid reflux 06/16/2014    Past Surgical History:  Procedure Laterality Date   URETHRA SURGERY  2000?   stricture?    Prior to Admission medications   Medication Sig Start Date End Date Taking? Authorizing Provider  acetic acid-hydrocortisone (VOSOL-HC) OTIC solution Place 3 drops into the left ear 2 (two) times daily. 08/07/18   Enid Derry, PA-C  albuterol (PROVENTIL HFA;VENTOLIN HFA) 108 (90 Base) MCG/ACT inhaler Inhale 2 puffs into the lungs every 6 (six) hours as needed for wheezing or shortness of breath. 05/31/18   Minna Antis, MD  clonazePAM (KLONOPIN) 1 MG tablet Take 1 tablet (1 mg total) by mouth as needed for up to 5 days for anxiety. 03/07/18 03/12/18  Enid Derry, PA-C  gabapentin (NEURONTIN) 100 MG capsule Take 1 capsule (100 mg total) by mouth 3 (three) times daily. 04/05/18 04/05/19  Loleta Rose, MD  pantoprazole (PROTONIX) 40 MG tablet Take 1 tablet (40 mg total) by mouth daily. 12/21/19 12/20/20  Minna Antis, MD  sucralfate (CARAFATE) 1 g tablet Take 1 tablet (1 g total) by mouth 4 (four) times daily -  with meals and at bedtime. 08/08/19 09/07/19   Concha Se, MD  trimethoprim-polymyxin b (POLYTRIM) ophthalmic solution Place 1 drop into the right eye every 4 (four) hours. 08/04/20   Enid Derry, PA-C    Allergies Codeine  Family History  Problem Relation Age of Onset   Bladder Cancer Neg Hx    Kidney cancer Neg Hx    Prostate cancer Neg Hx     Social History Social History   Tobacco Use   Smoking status: Former    Packs/day: 0.00    Types: Cigarettes    Quit date: 12/26/2019    Years since quitting: 1.3   Smokeless tobacco: Never   Tobacco comments:    2-3 cigarettes/day   Vaping Use   Vaping Use: Never used  Substance Use Topics   Alcohol use: Yes    Alcohol/week: 2.0 standard drinks    Types: 2 Cans of beer per week    Comment: weekends    Drug use: No    Review of Systems  Constitutional: No fever/chills Eyes: No visual changes. ENT: No sore throat. Respiratory: Positive cough Cardiovascular: Denies chest pain Gastrointestinal: Denies abdominal pain Genitourinary: Negative for dysuria. Musculoskeletal: Negative for back pain.  Positive for sternal pain Skin: Negative for rash. Psychiatric: no mood changes,     ____________________________________________   PHYSICAL EXAM:  VITAL SIGNS: ED Triage Vitals  Enc Vitals Group     BP 04/29/21 1219 129/89  Pulse Rate 04/29/21 1219 89     Resp 04/29/21 1219 16     Temp 04/29/21 1219 98.5 F (36.9 C)     Temp Source 04/29/21 1219 Oral     SpO2 04/29/21 1219 100 %     Weight 04/29/21 1233 220 lb 0.3 oz (99.8 kg)     Height 04/29/21 1233 5\' 8"  (1.727 m)     Head Circumference --      Peak Flow --      Pain Score --      Pain Loc --      Pain Edu? --      Excl. in GC? --     Constitutional: Alert and oriented. Well appearing and in no acute distress. Eyes: Conjunctivae are normal.  Head: Atraumatic. Nose: No congestion/rhinnorhea. Mouth/Throat: Mucous membranes are moist.   Neck:  supple no lymphadenopathy noted Cardiovascular:  Normal rate, regular rhythm. Heart sounds are normal Respiratory: Normal respiratory effort.  No retractions, lungs c t a  GU: deferred Musculoskeletal: FROM all extremities, warm and well perfused, sternum is tender to palpation, soft tissue swelling noted Neurologic:  Normal speech and language.  Skin:  Skin is warm, dry and intact. No rash noted. Psychiatric: Mood and affect are normal. Speech and behavior are normal.  ____________________________________________   LABS (all labs ordered are listed, but only abnormal results are displayed)  Labs Reviewed - No data to display ____________________________________________   ____________________________________________  RADIOLOGY  Chest x-ray, sternum x-ray  ____________________________________________   PROCEDURES  Procedure(s) performed: No  Procedures    ____________________________________________   INITIAL IMPRESSION / ASSESSMENT AND PLAN / ED COURSE  Pertinent labs & imaging results that were available during my care of the patient were reviewed by me and considered in my medical decision making (see chart for details).   Patient is a 40 year old male presents with sternal pain.  See HPI.  Physical exam shows patient appears stable.  X-ray of the chest reviewed by me confirmed by radiology to be negative for any acute abnormalities X-ray of the sternum reviewed by me confirmed by radiology to be negative for any acute abnormality  Did explain all findings to the patient.  Feel this is more of a muscle injury.  He is to apply ice after lifting.  Take Tylenol and ibuprofen.  Return emergency department worsening.  Is discharged stable condition.     Douglas Singh was evaluated in Emergency Department on 04/29/2021 for the symptoms described in the history of present illness. He was evaluated in the context of the global COVID-19 pandemic, which necessitated consideration that the patient might be at risk for  infection with the SARS-CoV-2 virus that causes COVID-19. Institutional protocols and algorithms that pertain to the evaluation of patients at risk for COVID-19 are in a state of rapid change based on information released by regulatory bodies including the CDC and federal and state organizations. These policies and algorithms were followed during the patient's care in the ED.    As part of my medical decision making, I reviewed the following data within the electronic MEDICAL RECORD NUMBER Nursing notes reviewed and incorporated, Old chart reviewed, Radiograph reviewed , Notes from prior ED visits, and [place] Controlled Substance Database  ____________________________________________   FINAL CLINICAL IMPRESSION(S) / ED DIAGNOSES  Final diagnoses:  Sternal pain      NEW MEDICATIONS STARTED DURING THIS VISIT:  New Prescriptions   No medications on file     Note:  This document was  prepared using Conservation officer, historic buildings and may include unintentional dictation errors.    Faythe Ghee, PA-C 04/29/21 1344    Minna Antis, MD 04/29/21 [phone]

## 2021-06-19 ENCOUNTER — Other Ambulatory Visit: Payer: Self-pay

## 2021-06-19 ENCOUNTER — Encounter: Payer: Self-pay | Admitting: Emergency Medicine

## 2021-06-19 ENCOUNTER — Emergency Department
Admission: EM | Admit: 2021-06-19 | Discharge: 2021-06-19 | Disposition: A | Discharge status: Home / Self Care | Payer: Self-pay | Attending: Emergency Medicine | Admitting: Emergency Medicine

## 2021-06-19 DIAGNOSIS — M5416 Radiculopathy, lumbar region: Secondary | ICD-10-CM | POA: Insufficient documentation

## 2021-06-19 DIAGNOSIS — Z87891 Personal history of nicotine dependence: Secondary | ICD-10-CM | POA: Insufficient documentation

## 2021-06-19 DIAGNOSIS — J45909 Unspecified asthma, uncomplicated: Secondary | ICD-10-CM | POA: Insufficient documentation

## 2021-06-19 MED ORDER — METHYLPREDNISOLONE 4 MG PO TBPK: ORAL_TABLET | ORAL | 0 refills | Status: DC

## 2021-06-19 MED ORDER — DEXAMETHASONE SODIUM PHOSPHATE 10 MG/ML IJ SOLN
10.0000 mg | Freq: Once | INTRAMUSCULAR | Status: AC
  Administered 2021-06-19: 10 mg via INTRAMUSCULAR
  Filled 2021-06-19: qty 1

## 2021-06-19 NOTE — ED Triage Notes (Signed)
Pt reports pain in his right hip, buttocks and leg that radiates down. Pt also reports had some swelling in his right foot great toe but it has went down. Denies injuries

## 2021-06-19 NOTE — ED Provider Notes (Signed)
[facility] Emergency Department Provider Note   ____________________________________________   Event Date/Time   First MD Initiated Contact with Patient 06/19/21 1108     (approximate)  I have reviewed the triage vital signs and the nursing notes.   HISTORY  Chief Complaint Hip Pain and Leg Pain    HPI Douglas Singh is a 40 y.o. male who presents for low back pain  LOCATION: Right lower lumbar paraspinal musculature DURATION: 1 week prior to arrival TIMING: Stable since onset SEVERITY: Severe QUALITY: Aching with radiculopathy CONTEXT: Patient states that 3 days after a prolonged car trip he began having right lower lumbar paraspinal muscular back pain with radiating pain around to the front of his thigh in the L2/L3 distribution MODIFYING FACTORS: Patient states that any walking on this right lower extremity worsens this pain and is partially relieved with rest, ibuprofen, and "a topical cream that I got from a friend" ASSOCIATED SYMPTOMS: Right lower extremity radiculopathy   Per medical record review, patient has history of anxiety, asthma, panic attacks, and GERD          Past Medical History:  Diagnosis Date   Anxiety    Asthma    Panic attacks    Reflux     Patient Active Problem List   Diagnosis Date Noted   Panic attack 12/03/2015   Anxiety 06/16/2014   Acid reflux 06/16/2014    Past Surgical History:  Procedure Laterality Date   URETHRA SURGERY  2000?   stricture?    Prior to Admission medications   Medication Sig Start Date End Date Taking? Authorizing Provider  methylPREDNISolone (MEDROL DOSEPAK) 4 MG TBPK tablet Follow instructions in package 06/19/21  Yes Jaykwon Morones, Clent Jacks, MD  acetic acid-hydrocortisone (VOSOL-HC) OTIC solution Place 3 drops into the left ear 2 (two) times daily. 08/07/18   Enid Derry, PA-C  albuterol (PROVENTIL HFA;VENTOLIN HFA) 108 (90 Base) MCG/ACT inhaler Inhale 2 puffs into the lungs every 6  (six) hours as needed for wheezing or shortness of breath. 05/31/18   Minna Antis, MD  clonazePAM (KLONOPIN) 1 MG tablet Take 1 tablet (1 mg total) by mouth as needed for up to 5 days for anxiety. 03/07/18 03/12/18  Enid Derry, PA-C  gabapentin (NEURONTIN) 100 MG capsule Take 1 capsule (100 mg total) by mouth 3 (three) times daily. 04/05/18 04/05/19  Loleta Rose, MD  pantoprazole (PROTONIX) 40 MG tablet Take 1 tablet (40 mg total) by mouth daily. 12/21/19 12/20/20  Minna Antis, MD  sucralfate (CARAFATE) 1 g tablet Take 1 tablet (1 g total) by mouth 4 (four) times daily -  with meals and at bedtime. 08/08/19 09/07/19  Concha Se, MD  trimethoprim-polymyxin b (POLYTRIM) ophthalmic solution Place 1 drop into the right eye every 4 (four) hours. 08/04/20   Enid Derry, PA-C    Allergies Codeine  Family History  Problem Relation Age of Onset   Bladder Cancer Neg Hx    Kidney cancer Neg Hx    Prostate cancer Neg Hx     Social History Social History   Tobacco Use   Smoking status: Former    Packs/day: 0.00    Types: Cigarettes    Quit date: 12/26/2019    Years since quitting: 1.4   Smokeless tobacco: Never   Tobacco comments:    2-3 cigarettes/day   Vaping Use   Vaping Use: Never used  Substance Use Topics   Alcohol use: Yes    Alcohol/week: 2.0 standard drinks  Types: 2 Cans of beer per week    Comment: weekends    Drug use: No    Review of Systems Constitutional: No fever/chills Eyes: No visual changes. ENT: No sore throat. Cardiovascular: Denies chest pain. Respiratory: Denies shortness of breath. Gastrointestinal: No abdominal pain.  No nausea, no vomiting.  No diarrhea. Genitourinary: Negative for dysuria. Musculoskeletal: Endorses acute right lower lumbar back pain Skin: Negative for rash. Neurological: Endorses right lower extremity paresthesias in the upper thigh.  Negative for headaches, weakness/numbness in any extremity Psychiatric: Negative for  suicidal ideation/homicidal ideation   ____________________________________________   PHYSICAL EXAM:  VITAL SIGNS: ED Triage Vitals  Enc Vitals Group     BP 06/19/21 1016 139/85     Pulse Rate 06/19/21 1016 88     Resp 06/19/21 1016 20     Temp 06/19/21 1016 98.6 F (37 C)     Temp Source 06/19/21 1016 Oral     SpO2 06/19/21 1016 100 %     Weight 06/19/21 1008 225 lb (102.1 kg)     Height 06/19/21 1008 5\' 8"  (1.727 m)     Head Circumference --      Peak Flow --      Pain Score 06/19/21 1008 10     Pain Loc --      Pain Edu? --      Excl. in GC? --    Constitutional: Alert and oriented. Well appearing and in no acute distress. Eyes: Conjunctivae are normal. PERRL. Head: Atraumatic. Nose: No congestion/rhinnorhea. Mouth/Throat: Mucous membranes are moist. Neck: No stridor Cardiovascular: Grossly normal heart sounds.  Good peripheral circulation. Respiratory: Normal respiratory effort.  No retractions. Gastrointestinal: Soft and nontender. No distention. Musculoskeletal: No obvious deformities.  Tenderness to palpation at the right anterior thigh, right buttocks, and right lower lumbar paraspinal musculature Neurologic: Positive straight leg test on the left causing pain in the right buttocks.  Normal speech and language. No gross focal neurologic deficits are appreciated. Skin:  Skin is warm and dry. No rash noted. Psychiatric: Mood and affect are normal. Speech and behavior are normal.  ____________________________________________   LABS (all labs ordered are listed, but only abnormal results are displayed)  Labs Reviewed - No data to display ____________________________________________ PROCEDURES  Procedure(s) performed (including Critical Care):  Procedures   ____________________________________________   INITIAL IMPRESSION / ASSESSMENT AND PLAN / ED COURSE  As part of my medical decision making, I reviewed the following data within the electronic medical  record, if available:  Nursing notes reviewed and incorporated, Labs reviewed, EKG interpreted, Old chart reviewed, Radiograph reviewed and Notes from prior ED visits reviewed and incorporated        Patient presents for low back pain. Given History and Exam the patient appears to be at low risk for Spinal Cord Compression Syndrome, Vertebral Malignancy/Mets, acute Spinal Fracture, Vertebral Osteomyelitis, Epidural Abscess, Infected or Obstructing Kidney Stone.  Their presentation appears most likely to be secondary to non-emergent musculoskeletal etiology vs non-emergent disc herniation.  ED Workup: Defer imaging and labwork for outpatient follow up at this time.  Disposition: Discharge. Strict return precautions discussed with patient with full understanding. Advised patient to follow up promptly with primary care provider      ____________________________________________   FINAL CLINICAL IMPRESSION(S) / ED DIAGNOSES  Final diagnoses:  Lumbar back pain with radiculopathy affecting right lower extremity     ED Discharge Orders          Ordered    methylPREDNISolone (MEDROL  DOSEPAK) 4 MG TBPK tablet        06/19/21 1132             Note:  This document was prepared using Dragon voice recognition software and may include unintentional dictation errors.    Merwyn Katos, MD 06/19/21 [id number]

## 2021-08-03 ENCOUNTER — Emergency Department
Admission: EM | Admit: 2021-08-03 | Discharge: 2021-08-03 | Disposition: A | Discharge status: Home / Self Care | Payer: Self-pay | Attending: Emergency Medicine | Admitting: Emergency Medicine

## 2021-08-03 ENCOUNTER — Other Ambulatory Visit: Payer: Self-pay

## 2021-08-03 DIAGNOSIS — J45909 Unspecified asthma, uncomplicated: Secondary | ICD-10-CM | POA: Insufficient documentation

## 2021-08-03 DIAGNOSIS — J111 Influenza due to unidentified influenza virus with other respiratory manifestations: Secondary | ICD-10-CM | POA: Insufficient documentation

## 2021-08-03 DIAGNOSIS — U071 COVID-19: Secondary | ICD-10-CM | POA: Insufficient documentation

## 2021-08-03 DIAGNOSIS — Z28310 Unvaccinated for covid-19: Secondary | ICD-10-CM | POA: Insufficient documentation

## 2021-08-03 DIAGNOSIS — Z87891 Personal history of nicotine dependence: Secondary | ICD-10-CM | POA: Insufficient documentation

## 2021-08-03 LAB — RESP PANEL BY RT-PCR (FLU A&B, COVID) ARPGX2
Influenza A by PCR: NEGATIVE
Influenza B by PCR: NEGATIVE
SARS Coronavirus 2 by RT PCR: POSITIVE — AB

## 2021-08-03 MED ORDER — NIRMATRELVIR/RITONAVIR (PAXLOVID)TABLET: 3.0000 | ORAL_TABLET | Freq: Two times a day (BID) | ORAL | 0 refills | Status: AC

## 2021-08-03 NOTE — ED Provider Notes (Signed)
[facility] Emergency Department Provider Note   ____________________________________________    I have reviewed the triage vital signs and the nursing notes.   HISTORY  Chief Complaint Cough     HPI Douglas Singh is a 40 y.o. male with a history of asthma, anxiety presents with complaints of mild cough, chills, congestion, sore throat, headache, body aches.  Patient reports symptoms started yesterday.  Reports he recently had the flu several weeks ago.  Has not completed vaccination series or boosters against COVID-19.  No shortness of breath  Past Medical History:  Diagnosis Date   Anxiety    Asthma    Panic attacks    Reflux     Patient Active Problem List   Diagnosis Date Noted   Panic attack 12/03/2015   Anxiety 06/16/2014   Acid reflux 06/16/2014    Past Surgical History:  Procedure Laterality Date   URETHRA SURGERY  2000?   stricture?    Prior to Admission medications   Medication Sig Start Date End Date Taking? Authorizing Provider  nirmatrelvir/ritonavir EUA (PAXLOVID) 20 x 150 MG & 10 x 100MG  TABS Take 3 tablets by mouth 2 (two) times daily for 5 days. Patient GFR is normal. Take nirmatrelvir (150 mg) two tablets twice daily for 5 days and ritonavir (100 mg) one tablet twice daily for 5 days. 08/03/21 08/08/21 Yes 14/11/22, MD  acetic acid-hydrocortisone (VOSOL-HC) OTIC solution Place 3 drops into the left ear 2 (two) times daily. 08/07/18   14/10/19, PA-C  albuterol (PROVENTIL HFA;VENTOLIN HFA) 108 (90 Base) MCG/ACT inhaler Inhale 2 puffs into the lungs every 6 (six) hours as needed for wheezing or shortness of breath. 05/31/18   07/31/18, MD  clonazePAM (KLONOPIN) 1 MG tablet Take 1 tablet (1 mg total) by mouth as needed for up to 5 days for anxiety. 03/07/18 03/12/18  03/14/18, PA-C  gabapentin (NEURONTIN) 100 MG capsule Take 1 capsule (100 mg total) by mouth 3 (three) times daily. 04/05/18 04/05/19  06/05/19, MD  methylPREDNISolone (MEDROL DOSEPAK) 4 MG TBPK tablet Follow instructions in package 06/19/21   06/21/21, MD  pantoprazole (PROTONIX) 40 MG tablet Take 1 tablet (40 mg total) by mouth daily. 12/21/19 12/20/20  12/22/20, MD  sucralfate (CARAFATE) 1 g tablet Take 1 tablet (1 g total) by mouth 4 (four) times daily -  with meals and at bedtime. 08/08/19 09/07/19  11/05/19, MD  trimethoprim-polymyxin b (POLYTRIM) ophthalmic solution Place 1 drop into the right eye every 4 (four) hours. 08/04/20   14/7/21, PA-C     Allergies Codeine  Family History  Problem Relation Age of Onset   Bladder Cancer Neg Hx    Kidney cancer Neg Hx    Prostate cancer Neg Hx     Social History Social History   Tobacco Use   Smoking status: Former    Packs/day: 0.00    Types: Cigarettes    Quit date: 12/26/2019    Years since quitting: 1.6   Smokeless tobacco: Never   Tobacco comments:    2-3 cigarettes/day   Vaping Use   Vaping Use: Never used  Substance Use Topics   Alcohol use: Yes    Alcohol/week: 2.0 standard drinks    Types: 2 Cans of beer per week    Comment: weekends    Drug use: No    Review of Systems  Constitutional: Positive chills  ENT: As above Respiratory: Cough, no shortness  of breath  Gastrointestinal: No abdominal pain.  No nausea, no vomiting.   Genitourinary: Negative for dysuria. Musculoskeletal: Myalgias Skin: Negative for rash. Neurological: Headaches    ____________________________________________   PHYSICAL EXAM:  VITAL SIGNS: ED Triage Vitals  Enc Vitals Group     BP 08/03/21 0758 (!) 145/95     Pulse Rate 08/03/21 0758 (!) 108     Resp 08/03/21 0758 19     Temp 08/03/21 0758 99.7 F (37.6 C)     Temp Source 08/03/21 0758 Oral     SpO2 08/03/21 0758 96 %     Weight --      Height --      Head Circumference --      Peak Flow --      Pain Score 08/03/21 0757 6     Pain Loc --      Pain Edu? --      Excl. in GC? --       Constitutional: Alert and oriented. No acute distress. Pleasant and interactive Eyes: Conjunctivae are normal.  Head: Atraumatic. Nose: No congestion/rhinnorhea. Mouth/Throat: Mucous membranes are moist.   Cardiovascular: Normal rate, regular rhythm.  Respiratory: Normal respiratory effort.  No retractions.  Clear to auscultation bilaterally, no wheezing, no rales Genitourinary: deferred Musculoskeletal: No lower extremity tenderness nor edema.   Neurologic:  Normal speech and language. No gross focal neurologic deficits are appreciated.   Skin:  Skin is warm, dry and intact. No rash noted.   ____________________________________________   LABS (all labs ordered are listed, but only abnormal results are displayed)  Labs Reviewed  RESP PANEL BY RT-PCR (FLU A&B, COVID) ARPGX2 - Abnormal; Notable for the following components:      Result Value   SARS Coronavirus 2 by RT PCR POSITIVE (*)    All other components within normal limits   ____________________________________________  EKG   ____________________________________________  RADIOLOGY   ____________________________________________   PROCEDURES  Procedure(s) performed: o  Procedures   Critical Care performed: No ____________________________________________   INITIAL IMPRESSION / ASSESSMENT AND PLAN / ED COURSE  Pertinent labs & imaging results that were available during my care of the patient were reviewed by me and considered in my medical decision making (see chart for details).   Patient well-appearing and in no acute distress, exam is quite reassuring, no wheezing or rails on exam.  HPI consistent with viral illness  PCR test returned positive for COVID given that he is not vaccinated we will treat with paxlovid   ____________________________________________   FINAL CLINICAL IMPRESSION(S) / ED DIAGNOSES  Final diagnoses:  Influenza-like illness  COVID-19      NEW MEDICATIONS STARTED  DURING THIS VISIT:  Discharge Medication List as of 08/03/2021  8:51 AM       Note:  This document was prepared using Dragon voice recognition software and may include unintentional dictation errors.    Jene Every, MD 08/03/21 [id number]

## 2021-08-03 NOTE — ED Triage Notes (Signed)
Pt comes with c/o cough sore throat and chills. Pt states recently got over flu few weeks ago. Pt states some relief with tylenol

## 2021-08-03 NOTE — ED Notes (Signed)
Patient discharged to home per MD order. Patient in stable condition, and deemed medically cleared by ED provider for discharge. Discharge instructions reviewed with patient/family using "Teach Back"; verbalized understanding of medication education and administration, and information about follow-up care. Denies further concerns. ° °

## 2021-08-03 NOTE — ED Notes (Signed)
Says wet cough and scratchy throat since yesterday. Says he has had a lingering cough since he had flu a couple weeks ago.  Also left shoulder has been hurting.

## 2021-08-12 ENCOUNTER — Emergency Department: Payer: Self-pay

## 2021-08-12 ENCOUNTER — Emergency Department
Admission: EM | Admit: 2021-08-12 | Discharge: 2021-08-12 | Disposition: A | Discharge status: Home / Self Care | Payer: Self-pay | Attending: Emergency Medicine | Admitting: Emergency Medicine

## 2021-08-12 ENCOUNTER — Encounter: Payer: Self-pay | Admitting: Emergency Medicine

## 2021-08-12 DIAGNOSIS — J40 Bronchitis, not specified as acute or chronic: Secondary | ICD-10-CM

## 2021-08-12 DIAGNOSIS — Z8616 Personal history of COVID-19: Secondary | ICD-10-CM | POA: Insufficient documentation

## 2021-08-12 DIAGNOSIS — Z87891 Personal history of nicotine dependence: Secondary | ICD-10-CM | POA: Insufficient documentation

## 2021-08-12 DIAGNOSIS — J45909 Unspecified asthma, uncomplicated: Secondary | ICD-10-CM | POA: Insufficient documentation

## 2021-08-12 MED ORDER — BENZONATATE 100 MG PO CAPS: 100.0000 mg | ORAL_CAPSULE | Freq: Three times a day (TID) | ORAL | 0 refills | Status: DC | PRN

## 2021-08-12 MED ORDER — AZITHROMYCIN 250 MG PO TABS: ORAL_TABLET | ORAL | 0 refills | Status: DC

## 2021-08-12 MED ORDER — ALBUTEROL SULFATE HFA 108 (90 BASE) MCG/ACT IN AERS: 2.0000 | INHALATION_SPRAY | RESPIRATORY_TRACT | 0 refills | Status: AC | PRN

## 2021-08-12 MED ORDER — PREDNISONE 50 MG PO TABS: 50.0000 mg | ORAL_TABLET | Freq: Every day | ORAL | 0 refills | Status: DC

## 2021-08-12 MED ORDER — PREDNISONE 20 MG PO TABS
60.0000 mg | ORAL_TABLET | Freq: Once | ORAL | Status: AC
  Administered 2021-08-12: 60 mg via ORAL
  Filled 2021-08-12: qty 3

## 2021-08-12 MED ORDER — AZITHROMYCIN 500 MG PO TABS
500.0000 mg | ORAL_TABLET | Freq: Once | ORAL | Status: AC
  Administered 2021-08-12: 500 mg via ORAL
  Filled 2021-08-12: qty 1

## 2021-08-12 NOTE — ED Triage Notes (Signed)
Pt tested + for covid 12/6, states he has mostly gotten over all symptoms but cough. States he is coughing up green/brown mucous, and Mucinex is not helping.

## 2021-08-12 NOTE — ED Provider Notes (Signed)
[facility] Emergency Department Provider Note  ____________________________________________  Time seen: Approximately 10:07 PM  I have reviewed the triage vital signs and the nursing notes.   HISTORY  Chief Complaint Cough and Nasal Congestion    HPI Douglas Singh is a 40 y.o. male who presents to the emergency department complaining of cough.  Patient had flu a month ago, COVID 10 days ago, seemed to improve and then worsened in regards to his cough.  All the other symptoms have resolved.  He is coughing up thick mucus.  No frank difficulty breathing.  No fevers or chills.  No other symptoms at this time.       Past Medical History:  Diagnosis Date   Anxiety    Asthma    Panic attacks    Reflux     Patient Active Problem List   Diagnosis Date Noted   Panic attack 12/03/2015   Anxiety 06/16/2014   Acid reflux 06/16/2014    Past Surgical History:  Procedure Laterality Date   URETHRA SURGERY  2000?   stricture?    Prior to Admission medications   Medication Sig Start Date End Date Taking? Authorizing Provider  albuterol (VENTOLIN HFA) 108 (90 Base) MCG/ACT inhaler Inhale 2 puffs into the lungs every 4 (four) hours as needed for wheezing or shortness of breath. 08/12/21  Yes Cyris Maalouf, Delorise Royals, PA-C  azithromycin (ZITHROMAX Z-PAK) 250 MG tablet Take 2 tablets (500 mg) on  Day 1,  followed by 1 tablet (250 mg) once daily on Days 2 through 5. 08/12/21  Yes Johnnathan Hagemeister, Delorise Royals, PA-C  benzonatate (TESSALON PERLES) 100 MG capsule Take 1 capsule (100 mg total) by mouth 3 (three) times daily as needed for cough. 08/12/21 08/12/22 Yes Shahzad Thomann, Delorise Royals, PA-C  predniSONE (DELTASONE) 50 MG tablet Take 1 tablet (50 mg total) by mouth daily with breakfast. 08/12/21  Yes Keiran Gaffey, Delorise Royals, PA-C  acetic acid-hydrocortisone (VOSOL-HC) OTIC solution Place 3 drops into the left ear 2 (two) times daily. 08/07/18   Enid Derry, PA-C  clonazePAM  (KLONOPIN) 1 MG tablet Take 1 tablet (1 mg total) by mouth as needed for up to 5 days for anxiety. 03/07/18 03/12/18  Enid Derry, PA-C  gabapentin (NEURONTIN) 100 MG capsule Take 1 capsule (100 mg total) by mouth 3 (three) times daily. 04/05/18 04/05/19  Loleta Rose, MD  methylPREDNISolone (MEDROL DOSEPAK) 4 MG TBPK tablet Follow instructions in package 06/19/21   Merwyn Katos, MD  pantoprazole (PROTONIX) 40 MG tablet Take 1 tablet (40 mg total) by mouth daily. 12/21/19 12/20/20  Minna Antis, MD  sucralfate (CARAFATE) 1 g tablet Take 1 tablet (1 g total) by mouth 4 (four) times daily -  with meals and at bedtime. 08/08/19 09/07/19  Concha Se, MD  trimethoprim-polymyxin b (POLYTRIM) ophthalmic solution Place 1 drop into the right eye every 4 (four) hours. 08/04/20   Enid Derry, PA-C    Allergies Codeine  Family History  Problem Relation Age of Onset   Bladder Cancer Neg Hx    Kidney cancer Neg Hx    Prostate cancer Neg Hx     Social History Social History   Tobacco Use   Smoking status: Former    Packs/day: 0.00    Types: Cigarettes    Quit date: 12/26/2019    Years since quitting: 1.6   Smokeless tobacco: Never   Tobacco comments:    2-3 cigarettes/day   Vaping Use   Vaping Use: Never used  Substance  Use Topics   Alcohol use: Yes    Alcohol/week: 2.0 standard drinks    Types: 2 Cans of beer per week    Comment: weekends    Drug use: No     Review of Systems  Constitutional: No fever/chills Eyes: No visual changes. No discharge ENT: No upper respiratory complaints. Cardiovascular: no chest pain. Respiratory: Positive cough. No SOB. Gastrointestinal: No abdominal pain.  No nausea, no vomiting.  No diarrhea.  No constipation. Musculoskeletal: Negative for musculoskeletal pain. Skin: Negative for rash, abrasions, lacerations, ecchymosis. Neurological: Negative for headaches, focal weakness or numbness.  10 System ROS otherwise  negative.  ____________________________________________   PHYSICAL EXAM:  VITAL SIGNS: ED Triage Vitals  Enc Vitals Group     BP 08/12/21 2202 (!) 153/82     Pulse Rate 08/12/21 2202 77     Resp 08/12/21 2202 18     Temp 08/12/21 2202 99.3 F (37.4 C)     Temp Source 08/12/21 2202 Oral     SpO2 08/12/21 2202 98 %     Weight 08/12/21 2204 225 lb 1.4 oz (102.1 kg)     Height --      Head Circumference --      Peak Flow --      Pain Score --      Pain Loc --      Pain Edu? --      Excl. in GC? --      Constitutional: Alert and oriented. Well appearing and in no acute distress. Eyes: Conjunctivae are normal. PERRL. EOMI. Head: Atraumatic. ENT:      Ears:       Nose: No congestion/rhinnorhea.      Mouth/Throat: Mucous membranes are moist.  Neck: No stridor.    Cardiovascular: Normal rate, regular rhythm. Normal S1 and S2.  Good peripheral circulation. Respiratory: Normal respiratory effort without tachypnea or retractions. Lungs CTAB. Good air entry to the bases with no decreased or absent breath sounds. Musculoskeletal: Full range of motion to all extremities. No gross deformities appreciated. Neurologic:  Normal speech and language. No gross focal neurologic deficits are appreciated.  Skin:  Skin is warm, dry and intact. No rash noted. Psychiatric: Mood and affect are normal. Speech and behavior are normal. Patient exhibits appropriate insight and judgement.   ____________________________________________   LABS (all labs ordered are listed, but only abnormal results are displayed)  Labs Reviewed - No data to display ____________________________________________  EKG   ____________________________________________  RADIOLOGY I personally viewed and evaluated these images as part of my medical decision making, as well as reviewing the written report by the radiologist.  ED Provider Interpretation: No acute consolidation  DG Chest 2 View  Result Date:  08/12/2021 CLINICAL DATA:  Cough, COVID positive EXAM: CHEST - 2 VIEW COMPARISON:  04/29/2021. FINDINGS: Cardiac and mediastinal contours are within normal limits. No focal pulmonary opacity. No pleural effusion or pneumothorax. No acute osseous abnormality. IMPRESSION: No acute cardiopulmonary process. Electronically Signed   By: Wiliam Ke M.D.   On: 08/12/2021 22:17    ____________________________________________    PROCEDURES  Procedure(s) performed:    Procedures    Medications  predniSONE (DELTASONE) tablet 60 mg (has no administration in time range)  azithromycin (ZITHROMAX) tablet 500 mg (has no administration in time range)     ____________________________________________   INITIAL IMPRESSION / ASSESSMENT AND PLAN / ED COURSE  Pertinent labs & imaging results that were available during my care of the patient were reviewed by  me and considered in my medical decision making (see chart for details).  Review of the [place] CSRS was performed in accordance of the [facility] prior to dispensing any controlled drugs.           Patient's diagnosis is consistent with bronchitis.  Patient presents emergency department with a cough after having flu and COVID in the last month.  Differential included bronchitis versus pneumonia.  Chest x-ray was reassuring with no evidence of acute consolidation.  Based on exam I feel that patient likely has bronchitis and I will treat with prednisone and albuterol for same.  However given the fact the patient seemed to improve, then worsen and has had 2 viral illnesses in the last month I will cover him for community-acquired pneumonia as well.  Patient with prescriptions for prednisone, albuterol, azithromycin..  Patient is given ED precautions to return to the ED for any worsening or new symptoms.     ____________________________________________  FINAL CLINICAL IMPRESSION(S) / ED DIAGNOSES  Final diagnoses:  Bronchitis      NEW MEDICATIONS  STARTED DURING THIS VISIT:  ED Discharge Orders          Ordered    predniSONE (DELTASONE) 50 MG tablet  Daily with breakfast        08/12/21 2245    azithromycin (ZITHROMAX Z-PAK) 250 MG tablet        08/12/21 2245    albuterol (VENTOLIN HFA) 108 (90 Base) MCG/ACT inhaler  Every 4 hours PRN        08/12/21 2245    benzonatate (TESSALON PERLES) 100 MG capsule  3 times daily PRN        08/12/21 2245                This chart was dictated using voice recognition software/Dragon. Despite best efforts to proofread, errors can occur which can change the meaning. Any change was purely unintentional.    Racheal Patches, PA-C 08/12/21 2246    Gilles Chiquito, MD 08/13/21 0040

## 2021-12-17 ENCOUNTER — Other Ambulatory Visit: Payer: Self-pay

## 2021-12-17 ENCOUNTER — Emergency Department: Payer: Self-pay

## 2021-12-17 ENCOUNTER — Emergency Department
Admission: EM | Admit: 2021-12-17 | Discharge: 2021-12-17 | Disposition: A | Discharge status: Home / Self Care | Payer: Self-pay | Attending: Emergency Medicine | Admitting: Emergency Medicine

## 2021-12-17 DIAGNOSIS — J0100 Acute maxillary sinusitis, unspecified: Secondary | ICD-10-CM | POA: Insufficient documentation

## 2021-12-17 DIAGNOSIS — Z79899 Other long term (current) drug therapy: Secondary | ICD-10-CM | POA: Insufficient documentation

## 2021-12-17 MED ORDER — AMOXICILLIN-POT CLAVULANATE 875-125 MG PO TABS: 1.0000 | ORAL_TABLET | Freq: Two times a day (BID) | ORAL | 0 refills | Status: AC

## 2021-12-17 NOTE — Discharge Instructions (Addendum)
Please take antibiotic as prescribed.  You may use Claritin-D to help with sinus congestion.  You may also consider a nasal spray such as Flonase or Nettie pot to help flush your sinuses.  Return to the ER for any fevers, difficulty breathing, worsening cough or any urgent changes in her health ?

## 2021-12-17 NOTE — ED Provider Notes (Signed)
?[facility] EMERGENCY DEPARTMENT ?Provider Note ? ? ?CSN: [id number] ?Arrival date & time: 12/17/21  1507 ? ?  ? ?History ? ?Chief Complaint  ?Patient presents with  ? Cough  ? ? ?Douglas Singh is a 41 y.o. male.  Presents with evaluation of cough, facial congestion and nasal drainage.  Symptoms have been present for 2 weeks.  Symptoms been persistent.  No fevers, chest pain, shortness of breath.  No sore throat.  He has had negative COVID test.  He denies any abdominal pain nausea vomiting or diarrhea.  He has a lot of nasal drainage with coughing. ? ?HPI ? ?  ? ?Home Medications ?Prior to Admission medications   ?Medication Sig Start Date End Date Taking? Authorizing Provider  ?amoxicillin-clavulanate (AUGMENTIN) 875-125 MG tablet Take 1 tablet by mouth every 12 (twelve) hours for 10 days. 12/17/21 12/27/21 Yes Evon Slack, PA-C  ?acetic acid-hydrocortisone (VOSOL-HC) OTIC solution Place 3 drops into the left ear 2 (two) times daily. 08/07/18   Enid Derry, PA-C  ?albuterol (VENTOLIN HFA) 108 (90 Base) MCG/ACT inhaler Inhale 2 puffs into the lungs every 4 (four) hours as needed for wheezing or shortness of breath. 08/12/21   Cuthriell, Delorise Royals, PA-C  ?azithromycin (ZITHROMAX Z-PAK) 250 MG tablet Take 2 tablets (500 mg) on  Day 1,  followed by 1 tablet (250 mg) once daily on Days 2 through 5. 08/12/21   Cuthriell, Delorise Royals, PA-C  ?benzonatate (TESSALON PERLES) 100 MG capsule Take 1 capsule (100 mg total) by mouth 3 (three) times daily as needed for cough. 08/12/21 08/12/22  Cuthriell, Delorise Royals, PA-C  ?clonazePAM (KLONOPIN) 1 MG tablet Take 1 tablet (1 mg total) by mouth as needed for up to 5 days for anxiety. 03/07/18 03/12/18  Enid Derry, PA-C  ?gabapentin (NEURONTIN) 100 MG capsule Take 1 capsule (100 mg total) by mouth 3 (three) times daily. 04/05/18 04/05/19  Loleta Rose, MD  ?methylPREDNISolone (MEDROL DOSEPAK) 4 MG TBPK tablet Follow instructions in package 06/19/21   Merwyn Katos, MD  ?pantoprazole (PROTONIX) 40 MG tablet Take 1 tablet (40 mg total) by mouth daily. 12/21/19 12/20/20  Minna Antis, MD  ?predniSONE (DELTASONE) 50 MG tablet Take 1 tablet (50 mg total) by mouth daily with breakfast. 08/12/21   Cuthriell, Delorise Royals, PA-C  ?sucralfate (CARAFATE) 1 g tablet Take 1 tablet (1 g total) by mouth 4 (four) times daily -  with meals and at bedtime. 08/08/19 09/07/19  Concha Se, MD  ?trimethoprim-polymyxin b (POLYTRIM) ophthalmic solution Place 1 drop into the right eye every 4 (four) hours. 08/04/20   Enid Derry, PA-C  ?   ? ?Allergies    ?Codeine   ? ?Review of Systems   ?Review of Systems ? ?Physical Exam ?Updated Vital Signs ?BP (!) 136/93   Pulse 78   Temp 98.3 ?F (36.8 ?C) (Oral)   Resp 18   Ht 5\' 8"  (1.727 m)   Wt 104.3 kg   SpO2 95%   BMI 34.97 kg/m?  ?Physical Exam ?Constitutional:   ?   Appearance: Normal appearance. He is well-developed.  ?HENT:  ?   Head: Normocephalic and atraumatic.  ?   Comments: Mild maxillary sinus tenderness. ?   Right Ear: External ear normal.  ?   Left Ear: External ear normal.  ?   Nose: Congestion present.  ?   Mouth/Throat:  ?   Mouth: Mucous membranes are moist.  ?   Pharynx: No oropharyngeal exudate or posterior  oropharyngeal erythema.  ?Eyes:  ?   Conjunctiva/sclera: Conjunctivae normal.  ?Cardiovascular:  ?   Rate and Rhythm: Normal rate.  ?   Pulses: Normal pulses.  ?Pulmonary:  ?   Effort: Pulmonary effort is normal. No respiratory distress.  ?Abdominal:  ?   General: Abdomen is flat. There is no distension.  ?   Tenderness: There is no abdominal tenderness. There is no guarding.  ?Musculoskeletal:     ?   General: Normal range of motion.  ?   Cervical back: Normal range of motion. No rigidity.  ?Skin: ?   General: Skin is warm.  ?   Findings: No rash.  ?Neurological:  ?   Mental Status: He is alert and oriented to person, place, and time.  ?Psychiatric:     ?   Behavior: Behavior normal.     ?   Thought Content: Thought  content normal.  ? ? ?ED Results / Procedures / Treatments   ?Labs ?(all labs ordered are listed, but only abnormal results are displayed) ?Labs Reviewed - No data to display ? ?EKG ?None ? ?Radiology ?DG Chest 2 View ? ?Result Date: 12/17/2021 ?CLINICAL DATA:  Cough for the past 3 weeks. EXAM: CHEST - 2 VIEW COMPARISON:  Chest x-ray dated August 12, 2021. FINDINGS: The heart size and mediastinal contours are within normal limits. Both lungs are clear. The visualized skeletal structures are unremarkable. IMPRESSION: No active cardiopulmonary disease. Electronically Signed   By: Obie Dredge M.D.   On: 12/17/2021 16:01   ? ?Procedures ?Procedures  ? ? ?Medications Ordered in ED ?Medications - No data to display ? ?ED Course/ Medical Decision Making/ A&P ?  ?                        ?Medical Decision Making ?Amount and/or Complexity of Data Reviewed ?Radiology: ordered. ? ?Risk ?Prescription drug management. ? ? ?41 year old male with 2 weeks of sinus congestion, facial pain with slight cough.  Vital signs are stable, afebrile.  Lungs are clear to auscultation.  Chest x-ray negative for acute cardiopulmonary process.  Symptoms consistent with sinusitis.  He is started on Augmentin.  Encourage patient to take Claritin-D and/or Flonase as well as consider Nettie pot.  He understands signs symptoms return to the ER for. ?Final Clinical Impression(s) / ED Diagnoses ?Final diagnoses:  ?Acute non-recurrent maxillary sinusitis  ? ? ?Rx / DC Orders ?ED Discharge Orders   ? ?      Ordered  ?  amoxicillin-clavulanate (AUGMENTIN) 875-125 MG tablet  Every 12 hours       ? 12/17/21 1624  ? ?  ?  ? ?  ? ? ?  ?Evon Slack, PA-C ?12/17/21 1628 ? ?  ?Merwyn Katos, MD ?12/17/21 1638 ? ?

## 2021-12-17 NOTE — ED Triage Notes (Signed)
Pt to ED for cough and runny nose since 3 weeks ago. Thought this was allergies and tried OTC allergy meds with no relief.  ? ?States cough is wet, coughing up clear-yellow sputum and is constant. Nose also constantly running. States sense of smell is less than usual and has been mildly fatigued. Had negative covid test at [facility] last week. ? ?Hx asthma. Respirations unlabored at this time.  ?

## 2022-02-18 ENCOUNTER — Other Ambulatory Visit: Payer: Self-pay

## 2022-02-18 ENCOUNTER — Emergency Department
Admission: EM | Admit: 2022-02-18 | Discharge: 2022-02-18 | Disposition: A | Discharge status: Home / Self Care | Payer: Self-pay | Attending: Student in an Organized Health Care Education/Training Program | Admitting: Student in an Organized Health Care Education/Training Program

## 2022-02-18 ENCOUNTER — Encounter: Payer: Self-pay | Admitting: Emergency Medicine

## 2022-02-18 ENCOUNTER — Emergency Department: Payer: Self-pay

## 2022-02-18 DIAGNOSIS — X58XXXA Exposure to other specified factors, initial encounter: Secondary | ICD-10-CM | POA: Insufficient documentation

## 2022-02-18 DIAGNOSIS — M5136 Other intervertebral disc degeneration, lumbar region: Secondary | ICD-10-CM | POA: Insufficient documentation

## 2022-02-18 DIAGNOSIS — S39012A Strain of muscle, fascia and tendon of lower back, initial encounter: Secondary | ICD-10-CM | POA: Insufficient documentation

## 2022-02-18 MED ORDER — KETOROLAC TROMETHAMINE 30 MG/ML IJ SOLN
30.0000 mg | Freq: Once | INTRAMUSCULAR | Status: AC
  Administered 2022-02-18: 30 mg via INTRAMUSCULAR
  Filled 2022-02-18: qty 1

## 2022-02-18 MED ORDER — METHYLPREDNISOLONE 4 MG PO TBPK: ORAL_TABLET | ORAL | 0 refills | Status: DC

## 2022-02-18 NOTE — ED Triage Notes (Signed)
C/O lower back pain radiating down both legs x 3 days.  Denies injury.. States has history of same in the past.  AAOx3.  Skin warm and dry. NAD. Ambulates with easy and steady gait.

## 2022-05-11 ENCOUNTER — Emergency Department
Admission: EM | Admit: 2022-05-11 | Discharge: 2022-05-11 | Disposition: A | Discharge status: Home / Self Care | Payer: Self-pay | Attending: Emergency Medicine | Admitting: Emergency Medicine

## 2022-05-11 ENCOUNTER — Other Ambulatory Visit: Payer: Self-pay

## 2022-05-11 DIAGNOSIS — R21 Rash and other nonspecific skin eruption: Secondary | ICD-10-CM | POA: Insufficient documentation

## 2022-05-11 MED ORDER — CEPHALEXIN 500 MG PO CAPS: 500.0000 mg | ORAL_CAPSULE | Freq: Three times a day (TID) | ORAL | 0 refills | Status: AC

## 2022-05-11 MED ORDER — TRIAMCINOLONE ACETONIDE 0.5 % EX CREA: 1.0000 | TOPICAL_CREAM | Freq: Three times a day (TID) | CUTANEOUS | 0 refills | Status: AC

## 2022-05-11 NOTE — ED Notes (Signed)
Pt reports rash on his right wrist x1 month with itching

## 2022-05-11 NOTE — Discharge Instructions (Addendum)
Follow-up with one of the dermatologist listed on your discharge papers if this area is not improving or continues to return.  An antibiotic was sent to the pharmacy to take for the next 7 days and also take Benadryl at night to prevent scratching the area while you are sleeping.  Triamcinolone cream is to be used to the specific areas and rub in completely.  Do not apply this to your face as it is too strong should you have any cream leftover.

## 2022-05-11 NOTE — ED Provider Notes (Signed)
[facility] Provider Note    Event Date/Time   First MD Initiated Contact with Patient 05/11/22 [phone]     (approximate)   History   Rash   HPI  Douglas Singh is a 41 y.o. male   presents to the ED with complaint of skin rash to his right hand for 6 weeks.  Patient initially thought that this was due to picking up some puppies and cleaning them however he has no rash to his left hand.  Patient has used erythromycin and his daughter's eczema cream without complete relief.  He states that the rash is itching at times and he has been scratching at it.  He denies any drainage from the area.  No fever.  Area has remained approximately the same and does not appear to be spreading.  Denies history of asthma, anxiety, GERD and panic attacks.      Physical Exam   Triage Vital Signs: ED Triage Vitals  Enc Vitals Group     BP 05/11/22 0835 (!) 150/106     Pulse Rate 05/11/22 0835 72     Resp 05/11/22 0835 16     Temp 05/11/22 0835 98.6 F (37 C)     Temp Source 05/11/22 0835 Oral     SpO2 05/11/22 0835 98 %     Weight 05/11/22 0836 230 lb (104.3 kg)     Height 05/11/22 0836 5\' 8"  (1.727 m)     Head Circumference --      Peak Flow --      Pain Score 05/11/22 0836 0     Pain Loc --      Pain Edu? --      Excl. in GC? --     Most recent vital signs: Vitals:   05/11/22 0835  BP: (!) 150/106  Pulse: 72  Resp: 16  Temp: 98.6 F (37 C)  SpO2: 98%     General: Awake, no distress.  CV:  Good peripheral perfusion.  Resp:  Normal effort.  Abd:  No distention.  Other:  Examination of the right wrist there is a macular erythematous area with a leathery type texture.  No drainage or warmth noted.  No involvement into the palm or digits.   ED Results / Procedures / Treatments   Labs (all labs ordered are listed, but only abnormal results are displayed) Labs Reviewed - No data to display    PROCEDURES:  Critical Care performed:    Procedures   MEDICATIONS ORDERED IN ED: Medications - No data to display   IMPRESSION / MDM / ASSESSMENT AND PLAN / ED COURSE  I reviewed the triage vital signs and the nursing notes.   Differential diagnosis includes, but is not limited to, skin eruption, contact dermatitis, eczema, bacterial infection.  41 year old male presents to the ED with complaint of 6 weeks rash to his right wrist area and not relieved by topical medications thus far.  On exam it has the consistency and appearance of eczema.  Patient was told that a prescription for triamcinolone cream 0.5% would be sent to the pharmacy for him to use 3 times a day and also because he has been scratching at it frequently a prescription for Keflex for 7 days prophylactically to keep this area from getting infected.  He is also encouraged to take Benadryl when it is itching especially at night.  He is to follow-up with [facility] or [facility] if not improving for further evaluation.  Patient's presentation is most consistent with acute complicated illness / injury requiring diagnostic workup.  FINAL CLINICAL IMPRESSION(S) / ED DIAGNOSES   Final diagnoses:  Rash and nonspecific skin eruption     Rx / DC Orders   ED Discharge Orders          Ordered    cephALEXin (KEFLEX) 500 MG capsule  3 times daily        05/11/22 0952    triamcinolone cream (KENALOG) 0.5 %  3 times daily        05/11/22 7867             Note:  This document was prepared using Dragon voice recognition software and may include unintentional dictation errors.   Tommi Rumps, PA-C 05/11/22 1223    Concha Se, MD 05/13/22 1249

## 2022-05-11 NOTE — ED Triage Notes (Signed)
Pt to ED for rash on right hand x 6 weeks.

## 2022-05-29 ENCOUNTER — Other Ambulatory Visit: Payer: Self-pay

## 2022-05-29 ENCOUNTER — Emergency Department
Admission: EM | Admit: 2022-05-29 | Discharge: 2022-05-29 | Disposition: A | Discharge status: Home / Self Care | Payer: No Typology Code available for payment source | Attending: Emergency Medicine | Admitting: Emergency Medicine

## 2022-05-29 ENCOUNTER — Emergency Department: Payer: No Typology Code available for payment source

## 2022-05-29 ENCOUNTER — Encounter: Payer: Self-pay | Admitting: Emergency Medicine

## 2022-05-29 DIAGNOSIS — Y92410 Unspecified street and highway as the place of occurrence of the external cause: Secondary | ICD-10-CM | POA: Insufficient documentation

## 2022-05-29 DIAGNOSIS — S161XXA Strain of muscle, fascia and tendon at neck level, initial encounter: Secondary | ICD-10-CM | POA: Diagnosis not present

## 2022-05-29 DIAGNOSIS — S199XXA Unspecified injury of neck, initial encounter: Secondary | ICD-10-CM | POA: Diagnosis present

## 2022-05-29 MED ORDER — BACLOFEN 10 MG PO TABS: 10.0000 mg | ORAL_TABLET | Freq: Three times a day (TID) | ORAL | 0 refills | Status: AC

## 2022-05-29 MED ORDER — MELOXICAM 15 MG PO TABS: 15.0000 mg | ORAL_TABLET | Freq: Every day | ORAL | 0 refills | Status: AC

## 2022-05-29 NOTE — Discharge Instructions (Signed)
Follow-up with your regular doctor as needed.  Follow-up with orthopedic spine.  1 week.  Apply ice to all areas that hurt.  Take medications as prescribed.  You will be sore for approximately 7 days.  This is normal.
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
## 2022-06-11 ENCOUNTER — Other Ambulatory Visit: Payer: Self-pay

## 2022-06-11 ENCOUNTER — Encounter: Payer: Self-pay | Admitting: Emergency Medicine

## 2022-06-11 ENCOUNTER — Emergency Department
Admission: EM | Admit: 2022-06-11 | Discharge: 2022-06-11 | Disposition: A | Discharge status: Home / Self Care | Payer: Self-pay | Attending: Emergency Medicine | Admitting: Emergency Medicine

## 2022-06-11 ENCOUNTER — Emergency Department: Payer: Self-pay

## 2022-06-11 DIAGNOSIS — R040 Epistaxis: Secondary | ICD-10-CM | POA: Insufficient documentation

## 2022-06-11 DIAGNOSIS — Z1152 Encounter for screening for COVID-19: Secondary | ICD-10-CM | POA: Insufficient documentation

## 2022-06-11 DIAGNOSIS — J029 Acute pharyngitis, unspecified: Secondary | ICD-10-CM | POA: Insufficient documentation

## 2022-06-11 DIAGNOSIS — J45909 Unspecified asthma, uncomplicated: Secondary | ICD-10-CM | POA: Insufficient documentation

## 2022-06-11 LAB — GROUP A STREP BY PCR: Group A Strep by PCR: NOT DETECTED

## 2022-06-11 LAB — SARS CORONAVIRUS 2 BY RT PCR: SARS Coronavirus 2 by RT PCR: NEGATIVE

## 2022-06-11 MED ORDER — PREDNISONE 10 MG (21) PO TBPK: ORAL_TABLET | ORAL | 0 refills | Status: DC

## 2022-06-11 MED ORDER — OXYMETAZOLINE HCL 0.05 % NA SOLN
1.0000 | Freq: Once | NASAL | Status: AC
  Administered 2022-06-11: 1 via NASAL
  Filled 2022-06-11: qty 30

## 2022-06-11 MED ORDER — BENZONATATE 100 MG PO CAPS: 100.0000 mg | ORAL_CAPSULE | Freq: Three times a day (TID) | ORAL | 0 refills | Status: AC | PRN

## 2022-06-11 MED ORDER — TRANEXAMIC ACID FOR EPISTAXIS
500.0000 mg | Freq: Once | TOPICAL | Status: AC
  Administered 2022-06-11: 500 mg via TOPICAL
  Filled 2022-06-11: qty 10

## 2022-06-11 NOTE — ED Provider Notes (Addendum)
[facility] Provider Note    Event Date/Time   First MD Initiated Contact with Patient 06/11/22 [phone]     (approximate)   History   Cough and Shortness of Breath   HPI  Douglas Singh is a 41 y.o. male  who comes in with cough and cold symptoms since this AM. No fever. Does have + discomfort in his throat for the past few days with congestion, and coughing, and mild SOB. Denies any history of heart attack, PE, or risk factors for PE.  Reports took some steroids at home that were left over x1 day and seemed to help. Reports history of asthma.       Physical Exam   Triage Vital Signs: ED Triage Vitals  Enc Vitals Group     BP 06/11/22 0142 136/87     Pulse Rate 06/11/22 0142 75     Resp 06/11/22 0142 18     Temp 06/11/22 0142 97.9 F (36.6 C)     Temp Source 06/11/22 0142 Oral     SpO2 06/11/22 0142 97 %     Weight 06/11/22 0140 230 lb (104.3 kg)     Height 06/11/22 0140 5\' 8"  (1.727 m)     Head Circumference --      Peak Flow --      Pain Score 06/11/22 0140 0     Pain Loc --      Pain Edu? --      Excl. in [address]? --     Most recent vital signs: Vitals:   06/11/22 0142 06/11/22 0348  BP: 136/87 (!) 138/94  Pulse: 75 70  Resp: 18 19  Temp: 97.9 F (36.6 C) 98.2 F (36.8 C)  SpO2: 97% 99%     General: Awake, no distress.  CV:  Good peripheral perfusion.  Resp:  Normal effort.  Clear lungs Abd:  No distention.  Soft and nontender Other:  OP clear with uvula midline, no swelling in tonsils  No swelling in legs.  No calf tenderness   ED Results / Procedures / Treatments   Labs (all labs ordered are listed, but only abnormal results are displayed) Labs Reviewed  SARS CORONAVIRUS 2 BY RT PCR  GROUP A STREP BY PCR     EKG  My interpretation of EKG:  Normal sinus, no st elevation, no twi, normal intervals   RADIOLOGY I have reviewed the xray personally and personally interpretted and no PNA   PROCEDURES:  Critical Care  performed: No  .Epistaxis Management  Date/Time: 06/11/2022 8:12 AM  Performed by: Vanessa [place], MD Authorized by: Vanessa Circle, MD   Consent:    Consent obtained:  Verbal   Consent given by:  Patient   Risks discussed:  Bleeding, infection, nasal injury and pain   Alternatives discussed:  No treatment Procedure details:    Treatment site:  R anterior Post-procedure details:    Assessment:  No improvement   Procedure completion:  Tolerated well, no immediate complications Comments:     Txa and afrin soaked gauze were placed in R nostril     MEDICATIONS ORDERED IN ED: Medications - No data to display   IMPRESSION / MDM / [place] / ED COURSE  I reviewed the triage vital signs and the nursing notes.   Patient's presentation is most consistent with acute, uncomplicated illness.   Differential is most likely pneumonia, viral, COVID.  No exudates in his throat or swollen tonsils.  Uvula  is midline.  Doubt this represents peritonsillar abscess, strep throat, retropharyngeal abscess.  Perc negative-- no CP to suggest ACS ekg WNL.  While I was discussing with patient most likely viral etiology patient did start having a small bloody nose from the in the right nostril.  Patient does report having a lot of blowing his nose and congestion.  Reports prior history of nasal injury and history of nosebleeds previously.  TXA and Afrin soaked gauze were placed in the right nostril.  Covid test negative  [place] xray negative  8:11 AM  Removed nasal packing without any additional bleeding.  Discussed with patient not blowing his nose.  We discussed management at home with Afrin and nasal clamp and using intermitted fire at nighttime to keep his nose passages moist.  He expressed understanding.  Patient requesting antibiotics by explained to patient that this is most likely viral in nature and unless the strep test is positive would hold off on antibiotics.  Given his history of  asthma we will give him a course of steroids to help with inflammation given I suspect more likely a viral pharyngitis.  Strep test negative  reevaluated patient has had no continued bleeding.  He is got clots noted in the right nose.  We discussed trying to avoid blowing his nose, using him in a fire we will give some steroids given the continued sore throat.  Patient expressed understanding felt comfortable with discharge home    FINAL CLINICAL IMPRESSION(S) / ED DIAGNOSES   Final diagnoses:  Viral pharyngitis  Epistaxis     Rx / DC Orders   ED Discharge Orders     None        Note:  This document was prepared using Dragon voice recognition software and may include unintentional dictation errors.   Concha Se, MD 06/11/22 7902    Concha Se, MD 06/11/22 [phone]

## 2022-06-11 NOTE — Discharge Instructions (Signed)
Try to take Tylenol 1 g every 8 hours to help with any pain.  Given the nosebleed a try to hold off on the ibuprofen for now and to ensure that you do not have additional nosebleeds.  Take the steroids to help with any viral inflammation.  Your strep test was negative.    If you develop return of bleeding then please use the Afrin and nasal clamp for 15 minutes and if is not getting better return to the ER.  You follow-up with ENT as needed.  Try to use admitted fire at nighttime in order to help keep your nasal mucosa moist.  Return to the ER for worsening shortness of breath fevers or any other concerns

## 2022-06-11 NOTE — ED Notes (Signed)
Per dr. Joni Fears no blood work or covid swab needed at this time

## 2022-06-11 NOTE — ED Notes (Signed)
No answer when calling pt for covid swab and xray

## 2022-06-11 NOTE — ED Triage Notes (Signed)
Pt states has had shob and cold symptoms since this am. Pt appears in no acute distress. Pt denies known fever.

## 2022-12-04 ENCOUNTER — Emergency Department
Admission: EM | Admit: 2022-12-04 | Discharge: 2022-12-04 | Disposition: A | Discharge status: Home / Self Care | Payer: Self-pay | Attending: Emergency Medicine | Admitting: Emergency Medicine

## 2022-12-04 ENCOUNTER — Other Ambulatory Visit: Payer: Self-pay

## 2022-12-04 DIAGNOSIS — J45909 Unspecified asthma, uncomplicated: Secondary | ICD-10-CM | POA: Insufficient documentation

## 2022-12-04 DIAGNOSIS — K920 Hematemesis: Secondary | ICD-10-CM | POA: Insufficient documentation

## 2022-12-04 LAB — COMPREHENSIVE METABOLIC PANEL
ALT: 24 U/L (ref 0–44)
AST: 18 U/L (ref 15–41)
Albumin: 3.9 g/dL (ref 3.5–5.0)
Alkaline Phosphatase: 50 U/L (ref 38–126)
Anion gap: 8 (ref 5–15)
BUN: 14 mg/dL (ref 6–20)
CO2: 22 mmol/L (ref 22–32)
Calcium: 9.2 mg/dL (ref 8.9–10.3)
Chloride: 105 mmol/L (ref 98–111)
Creatinine, Ser: 0.77 mg/dL (ref 0.61–1.24)
GFR, Estimated: >60 mL/min (ref >60)
Glucose, Bld: 101 mg/dL — ABNORMAL HIGH (ref 70–99)
Potassium: 3.7 mmol/L (ref 3.5–5.1)
Sodium: 135 mmol/L (ref 135–145)
Total Bilirubin: 0.6 mg/dL (ref 0.3–1.2)
Total Protein: 7.2 g/dL (ref 6.5–8.1)

## 2022-12-04 LAB — URINALYSIS, ROUTINE W REFLEX MICROSCOPIC
Bilirubin Urine: NEGATIVE
Glucose, UA: NEGATIVE mg/dL
Hgb urine dipstick: NEGATIVE
Ketones, ur: NEGATIVE mg/dL
Leukocytes,Ua: NEGATIVE
Nitrite: NEGATIVE
Protein, ur: NEGATIVE mg/dL
Specific Gravity, Urine: 1.019 (ref 1.005–1.030)
pH: 6 (ref 5.0–8.0)

## 2022-12-04 LAB — CBC
HCT: 42.5 % (ref 39.0–52.0)
Hemoglobin: 14.1 g/dL (ref 13.0–17.0)
MCH: 29 pg (ref 26.0–34.0)
MCHC: 33.2 g/dL (ref 30.0–36.0)
MCV: 87.4 fL (ref 80.0–100.0)
Platelets: 221 10*3/uL (ref 150–400)
RBC: 4.86 MIL/uL (ref 4.22–5.81)
RDW: 14.2 % (ref 11.5–15.5)
WBC: 5.4 10*3/uL (ref 4.0–10.5)
nRBC: 0 % (ref 0.0–0.2)

## 2022-12-04 LAB — LIPASE, BLOOD: Lipase: 48 U/L (ref 11–51)

## 2022-12-04 MED ORDER — TRIAMCINOLONE ACETONIDE 0.1 % EX OINT: 1.0000 | TOPICAL_OINTMENT | Freq: Two times a day (BID) | CUTANEOUS | 0 refills | Status: AC

## 2022-12-04 MED ORDER — PANTOPRAZOLE SODIUM 40 MG PO TBEC: 40.0000 mg | DELAYED_RELEASE_TABLET | Freq: Every day | ORAL | 0 refills | Status: AC

## 2022-12-04 NOTE — ED Triage Notes (Signed)
Pt to ED via POV c/o hematemesis and diarrhea. Pt reports he started throwing up a few hrs ago and noticed some bright red throw up. Pt also had 2 episodes of diarrhea, no abdominal pain. Pt also wants to be checked out for bumps on his left leg that started a month that are itchy and red. Denies CP, SOB, dizziness.

## 2022-12-04 NOTE — ED Provider Notes (Signed)
[facility] Provider Note    Event Date/Time   First MD Initiated Contact with Patient 12/04/22 [phone]     (approximate)   History   Hematemesis and Diarrhea   HPI  Douglas Singh is a 42 y.o. male medical history of anxiety asthma who presents with hematemesis.  Patient around 2 AM and felt nauseated went to the bathroom and vomited.  Initially vomited look normal then he noticed what looked like bright red blood.  Spit up some blood into the toilet.  He only had 1 episode of emesis.  He is currently asymptomatic.  Did have some loose stool today 3 episodes.  Denies black stool or blood in his stool no abdominal pain.  He did have several shots earlier in the night.  Denies any history of GI bleeding no heavy use of NSAIDs.  He is not on blood thinners.  Patient was eating sausage prior.     Past Medical History:  Diagnosis Date   Anxiety    Asthma    Panic attacks    Reflux     Patient Active Problem List   Diagnosis Date Noted   Panic attack 12/03/2015   Anxiety 06/16/2014   Acid reflux 06/16/2014     Physical Exam  Triage Vital Signs: ED Triage Vitals  Enc Vitals Group     BP 12/04/22 0443 (!) 150/85     Pulse Rate 12/04/22 0443 77     Resp 12/04/22 0443 18     Temp 12/04/22 0443 98.2 F (36.8 C)     Temp Source 12/04/22 0443 Oral     SpO2 12/04/22 0443 97 %     Weight --      Height --      Head Circumference --      Peak Flow --      Pain Score 12/04/22 0455 0     Pain Loc --      Pain Edu? --      Excl. in GC? --     Most recent vital signs: Vitals:   12/04/22 0443  BP: (!) 150/85  Pulse: 77  Resp: 18  Temp: 98.2 F (36.8 C)  SpO2: 97%     General: Awake, no distress.  CV:  Good peripheral perfusion.  Resp:  Normal effort.  Abd:  No distention.  Abdomen is soft and nontender Neuro:             Awake, Alert, Oriented x 3  Other:  Rectal exam deferred, patient adamant that his stool was brown  Erythematous  excoriated plaques scattered on the bilateral shins   ED Results / Procedures / Treatments  Labs (all labs ordered are listed, but only abnormal results are displayed) Labs Reviewed  COMPREHENSIVE METABOLIC PANEL - Abnormal; Notable for the following components:      Result Value   Glucose, Bld 101 (*)    All other components within normal limits  URINALYSIS, ROUTINE W REFLEX MICROSCOPIC - Abnormal; Notable for the following components:   Color, Urine YELLOW (*)    APPearance CLEAR (*)    All other components within normal limits  LIPASE, BLOOD  CBC     EKG     RADIOLOGY    PROCEDURES:  Critical Care performed: No  Procedures    MEDICATIONS ORDERED IN ED: Medications - No data to display   IMPRESSION / MDM / ASSESSMENT AND PLAN / ED COURSE  I reviewed the triage vital signs and  the nursing notes.                              Patient's presentation is most consistent with acute complicated illness / injury requiring diagnostic workup.  Differential diagnosis includes, but is not limited to, gastritis, peptic ulcer, AVM  Patient is a 42 year old male who presents after an episode of hematemesis.  Felt nauseated around 2 AM had episode of vomit initially look normal he then noticed bright red blood in it.  Only had 1 episode of vomiting.  Has had 3 episodes of loose stool today.  He and did look at his stool says it is brown normal in color.  Denies any history of GI bleeding no history of liver issues heavy NSAID use or chronic alcohol use.  Did drink 3 shots this evening.  Is hypertensive vitals are otherwise reassuring.  On exam he looks well.  Abdomen is benign.  We discussed a rectal exam to screen for melanoma but patient is quite adamant that his stool is brown he would like to defer the rectal exam.  His labs are notable for normal hemoglobin, normal BUN normal lipase and LFTs.  Differentials as above.  Suspect likely some gastritis in the setting of  patient's alcohol use.  Given he is not having melena or report has normal hemoglobin normal BUN and normal vital signs and no risk factors for significant GI bleeding I do think that he can safely be discharged.  We did discuss strict return precautions for any black stool or additional episodes of significant bleeding and emesis.  I will start him on Protonix.  Patient secondarily complained of some rash on his lower legs.  It has been about a month that has had some pruritic rash on the lower calves.  Has tried some antibiotic women hydrocortisone with no relief.  It is itching nonpainful no clear exposures.  Looks like he has a dermatitis may be contact versus atopic on his lower extremities.  Not look infected to me.  Will prescribe triamcinolone.        FINAL CLINICAL IMPRESSION(S) / ED DIAGNOSES   Final diagnoses:  Hematemesis, unspecified whether nausea present     Rx / DC Orders   ED Discharge Orders          Ordered    pantoprazole (PROTONIX) 40 MG tablet  Daily        12/04/22 0551    triamcinolone ointment (KENALOG) 0.1 %  2 times daily        12/04/22 0551             Note:  This document was prepared using Dragon voice recognition software and may include unintentional dictation errors.   Georga Hacking, MD 12/04/22 [phone]

## 2022-12-04 NOTE — Discharge Instructions (Addendum)
Your blood work was reassuring.  Please start taking the Protonix daily for acid production.  If you have black stool or you did have another episode of significant bleeding in your vomit then please return to the emergency department.  Please avoid alcohol and avoid NSAIDs including Motrin ibuprofen Advil as this can cause ulcers.  If you have no further episode of GI bleeding please follow-up with your primary care doctor.  If you have any additional episodes please either return to ER or follow-up with gastroenterology listed above.

## 2023-02-13 ENCOUNTER — Encounter: Payer: Self-pay | Admitting: Radiology

## 2023-02-13 ENCOUNTER — Emergency Department: Payer: Self-pay

## 2023-02-13 ENCOUNTER — Emergency Department
Admission: EM | Admit: 2023-02-13 | Discharge: 2023-02-13 | Disposition: A | Discharge status: Home / Self Care | Payer: Self-pay | Attending: Emergency Medicine | Admitting: Emergency Medicine

## 2023-02-13 ENCOUNTER — Other Ambulatory Visit: Payer: Self-pay

## 2023-02-13 DIAGNOSIS — M940 Chondrocostal junction syndrome [Tietze]: Secondary | ICD-10-CM | POA: Insufficient documentation

## 2023-02-13 DIAGNOSIS — R079 Chest pain, unspecified: Secondary | ICD-10-CM

## 2023-02-13 LAB — CBC
HCT: 44.6 % (ref 39.0–52.0)
Hemoglobin: 14.8 g/dL (ref 13.0–17.0)
MCH: 29.2 pg (ref 26.0–34.0)
MCHC: 33.2 g/dL (ref 30.0–36.0)
MCV: 88.1 fL (ref 80.0–100.0)
Platelets: 254 10*3/uL (ref 150–400)
RBC: 5.06 MIL/uL (ref 4.22–5.81)
RDW: 13.5 % (ref 11.5–15.5)
WBC: 4.5 10*3/uL (ref 4.0–10.5)
nRBC: 0 % (ref 0.0–0.2)

## 2023-02-13 LAB — BASIC METABOLIC PANEL
Anion gap: 10 (ref 5–15)
BUN: 12 mg/dL (ref 6–20)
CO2: 24 mmol/L (ref 22–32)
Calcium: 8.9 mg/dL (ref 8.9–10.3)
Chloride: 103 mmol/L (ref 98–111)
Creatinine, Ser: 0.8 mg/dL (ref 0.61–1.24)
GFR, Estimated: >60 mL/min (ref >60)
Glucose, Bld: 115 mg/dL — ABNORMAL HIGH (ref 70–99)
Potassium: 3.6 mmol/L (ref 3.5–5.1)
Sodium: 137 mmol/L (ref 135–145)

## 2023-02-13 LAB — TROPONIN I (HIGH SENSITIVITY): Troponin I (High Sensitivity): 3 ng/L (ref <18)

## 2023-02-13 MED ORDER — KETOROLAC TROMETHAMINE 30 MG/ML IJ SOLN
30.0000 mg | Freq: Once | INTRAMUSCULAR | Status: AC
  Administered 2023-02-13: 30 mg via INTRAMUSCULAR
  Filled 2023-02-13: qty 1

## 2023-02-13 MED ORDER — LIDOCAINE 5 % EX PTCH
1.0000 | MEDICATED_PATCH | CUTANEOUS | Status: DC
  Administered 2023-02-13: 1 via TRANSDERMAL
  Filled 2023-02-13: qty 1

## 2023-02-13 MED ORDER — LIDOCAINE 5 % EX PTCH: 1.0000 | MEDICATED_PATCH | Freq: Two times a day (BID) | CUTANEOUS | 0 refills | Status: AC

## 2023-02-13 NOTE — ED Triage Notes (Signed)
Pt states for the past few weeks he has had pain to his sternum. PT states it hurts to touch it. Pt also endorses he has had little pains in his stomach off and on as well.

## 2023-02-13 NOTE — ED Notes (Signed)
See triage note  Presents with a 2-3 week hx of chest discomfort  Area tender on palpation  No fever or cough  a/o on arrival  Afebrile

## 2023-02-13 NOTE — ED Provider Notes (Signed)
[facility] Provider Note    Event Date/Time   First MD Initiated Contact with Patient 02/13/23 1338     (approximate)   History   Chief Complaint Chest Pain   HPI  Douglas Singh is a 42 y.o. male with past medical history of anxiety and GERD who presents to the ED complaining of chest pain.  Patient reports that he has had about 2 weeks of constant pain over his lower sternum.  He describes it as a dull ache that is worse when he presses on the area.  He has not had any fevers or cough and denies any difficulty breathing.  He states he has been taking Tylenol with partial relief.  He denies any trauma to his chest or heavy lifting.  He has not noticed any pain or swelling in his legs.     Physical Exam   Triage Vital Signs: ED Triage Vitals  Enc Vitals Group     BP 02/13/23 1302 (!) 155/99     Pulse Rate 02/13/23 1302 72     Resp 02/13/23 1302 18     Temp 02/13/23 1302 98.2 F (36.8 C)     Temp Source 02/13/23 1302 Oral     SpO2 02/13/23 1302 98 %     Weight 02/13/23 1241 220 lb (99.8 kg)     Height 02/13/23 1241 5\' 8"  (1.727 m)     Head Circumference --      Peak Flow --      Pain Score --      Pain Loc --      Pain Edu? --      Excl. in GC? --     Most recent vital signs: Vitals:   02/13/23 1302  BP: (!) 155/99  Pulse: 72  Resp: 18  Temp: 98.2 F (36.8 C)  SpO2: 98%    Constitutional: Alert and oriented. Eyes: Conjunctivae are normal. Head: Atraumatic. Nose: No congestion/rhinnorhea. Mouth/Throat: Mucous membranes are moist.  Cardiovascular: Normal rate, regular rhythm. Grossly normal heart sounds.  2+ radial pulses bilaterally. Respiratory: Normal respiratory effort.  No retractions. Lungs CTAB.  Tenderness to palpation over lower sternum. Gastrointestinal: Soft and nontender. No distention. Musculoskeletal: No lower extremity tenderness nor edema.  Neurologic:  Normal speech and language. No gross focal neurologic deficits  are appreciated.    ED Results / Procedures / Treatments   Labs (all labs ordered are listed, but only abnormal results are displayed) Labs Reviewed  BASIC METABOLIC PANEL - Abnormal; Notable for the following components:      Result Value   Glucose, Bld 115 (*)    All other components within normal limits  CBC  TROPONIN I (HIGH SENSITIVITY)     EKG  ED ECG REPORT I, Chesley Noon, the attending physician, personally viewed and interpreted this ECG.   Date: 02/13/2023  EKG Time: 12:43  Rate: 68  Rhythm: normal sinus rhythm  Axis: Normal  Intervals:none  ST&T Change: None  RADIOLOGY Chest x-ray reviewed and interpreted by me with no infiltrate, edema, or effusion.  PROCEDURES:  Critical Care performed: No  Procedures   MEDICATIONS ORDERED IN ED: Medications  lidocaine (LIDODERM) 5 % 1 patch (1 patch Transdermal Patch Applied 02/13/23 1417)  ketorolac (TORADOL) 30 MG/ML injection 30 mg (30 mg Intramuscular Given 02/13/23 1417)     IMPRESSION / MDM / ASSESSMENT AND PLAN / ED COURSE  I reviewed the triage vital signs and the nursing notes.  42 y.o. male with past medical history of anxiety and GERD who presents to the ED complaining of constant pain just below his sternum over the past 2 weeks.  Patient's presentation is most consistent with acute presentation with potential threat to life or bodily function.  Differential diagnosis includes, but is not limited to, ACS, PE, pneumonia, pneumothorax, musculoskeletal pain, GERD, anxiety.  Patient well-appearing and in no acute distress, vital signs are unremarkable.  EKG shows no evidence of arrhythmia or ischemia and troponin within normal limits, doubt ACS given constant pain for 2 weeks.  Remainder of labs are reassuring with no significant anemia, leukocytosis, tract abnormality, or AKI.  Chest x-ray is unremarkable.  Suspect musculoskeletal etiology for his pain and we will treat  with IM Toradol and Lidoderm patch.  He is appropriate for outpatient management with PCP follow-up, was counseled to return to the ED for new or worsening symptoms.  Patient agrees with plan.      FINAL CLINICAL IMPRESSION(S) / ED DIAGNOSES   Final diagnoses:  Nonspecific chest pain  Costochondritis     Rx / DC Orders   ED Discharge Orders          Ordered    lidocaine (LIDODERM) 5 %  Every 12 hours        02/13/23 1412             Note:  This document was prepared using Dragon voice recognition software and may include unintentional dictation errors.   Chesley Noon, MD 02/13/23 [phone]

## 2023-08-26 ENCOUNTER — Other Ambulatory Visit: Payer: Self-pay

## 2023-08-26 ENCOUNTER — Emergency Department
Admission: EM | Admit: 2023-08-26 | Discharge: 2023-08-26 | Disposition: A | Discharge status: Home / Self Care | Payer: Self-pay | Attending: Emergency Medicine | Admitting: Emergency Medicine

## 2023-08-26 ENCOUNTER — Emergency Department: Payer: Self-pay

## 2023-08-26 DIAGNOSIS — J45909 Unspecified asthma, uncomplicated: Secondary | ICD-10-CM | POA: Insufficient documentation

## 2023-08-26 DIAGNOSIS — F17210 Nicotine dependence, cigarettes, uncomplicated: Secondary | ICD-10-CM | POA: Insufficient documentation

## 2023-08-26 DIAGNOSIS — R051 Acute cough: Secondary | ICD-10-CM

## 2023-08-26 DIAGNOSIS — Z1152 Encounter for screening for COVID-19: Secondary | ICD-10-CM | POA: Insufficient documentation

## 2023-08-26 DIAGNOSIS — J069 Acute upper respiratory infection, unspecified: Secondary | ICD-10-CM | POA: Insufficient documentation

## 2023-08-26 LAB — SARS CORONAVIRUS 2 BY RT PCR: SARS Coronavirus 2 by RT PCR: NEGATIVE

## 2023-08-26 MED ORDER — PREDNISONE 10 MG (21) PO TBPK: ORAL_TABLET | ORAL | 0 refills | Status: DC

## 2023-08-26 MED ORDER — ALBUTEROL SULFATE HFA 108 (90 BASE) MCG/ACT IN AERS: 2.0000 | INHALATION_SPRAY | Freq: Four times a day (QID) | RESPIRATORY_TRACT | 2 refills | Status: AC | PRN

## 2023-08-26 NOTE — Discharge Instructions (Signed)
You been seen today and diagnosed with a viral upper respiratory infection.  I recommend that you use your albuterol inhaler as needed over the next 48 to 72 hours.  I will provide you with a refill for that inhaler.  I will also provide you with a prescription for a prednisone taper that you will take as directed.  If you continue to have issues after the next 72 hours please follow-up with urgent care or your primary care provider.

## 2023-08-26 NOTE — ED Provider Notes (Signed)
[facility] Emergency Department Provider Note   ____________________________________________   Event Date/Time   First MD Initiated Contact with Patient 08/26/23 1151     (approximate)  I have reviewed the triage vital signs and the nursing notes.   HISTORY  Chief Complaint Cough    HPI Douglas Singh is a 42 y.o. male presents to the emergency room with complaint of cold symptoms for the last 4 to 5 days.  Patient reports that he started with a cough, runny nose, sore throat, postnasal drip to 5 days ago.  He also had a fever for the first 24 to 48 hours.  Patient reports that the only symptom that remains is the persistent cough.  He reports that intermittently he does have some chest tightness/wheezing.  Patient does have history of asthma.  Patient reports that he used to have an albuterol inhaler but that it has expired.  Patient has been taking over-the-counter medications to treat his symptoms with minimal relief.   Past Medical History:  Diagnosis Date   Anxiety    Asthma    Panic attacks    Reflux     Patient Active Problem List   Diagnosis Date Noted   Panic attack 12/03/2015   Anxiety 06/16/2014   Acid reflux 06/16/2014    Past Surgical History:  Procedure Laterality Date   URETHRA SURGERY  2000?   stricture?    Prior to Admission medications   Medication Sig Start Date End Date Taking? Authorizing Provider  albuterol (VENTOLIN HFA) 108 (90 Base) MCG/ACT inhaler Inhale 2 puffs into the lungs every 6 (six) hours as needed for wheezing or shortness of breath. 08/26/23  Yes Herschell Dimes, NP  predniSONE (STERAPRED UNI-PAK 21 TAB) 10 MG (21) TBPK tablet Take 6 pills day one and then decrease by 1 pill each day 08/26/23  Yes Herschell Dimes, NP  albuterol (VENTOLIN HFA) 108 (90 Base) MCG/ACT inhaler Inhale 2 puffs into the lungs every 4 (four) hours as needed for wheezing or shortness of breath. 08/12/21   Cuthriell, Delorise Royals, PA-C   clonazePAM (KLONOPIN) 1 MG tablet Take 1 tablet (1 mg total) by mouth as needed for up to 5 days for anxiety. 03/07/18 03/12/18  Enid Derry, PA-C  gabapentin (NEURONTIN) 100 MG capsule Take 1 capsule (100 mg total) by mouth 3 (three) times daily. 04/05/18 04/05/19  Loleta Rose, MD  lidocaine (LIDODERM) 5 % Place 1 patch onto the skin every 12 (twelve) hours. Remove & Discard patch within 12 hours or as directed by MD 02/13/23 02/13/24  Chesley Noon, MD  pantoprazole (PROTONIX) 40 MG tablet Take 1 tablet (40 mg total) by mouth daily. 12/21/19 12/20/20  Minna Antis, MD  pantoprazole (PROTONIX) 40 MG tablet Take 1 tablet (40 mg total) by mouth daily. 12/04/22 01/03/23  Georga Hacking, MD  predniSONE (STERAPRED UNI-PAK 21 TAB) 10 MG (21) TBPK tablet Take as packaging directs 06/11/22   Concha Se, MD  sucralfate (CARAFATE) 1 g tablet Take 1 tablet (1 g total) by mouth 4 (four) times daily -  with meals and at bedtime. 08/08/19 09/07/19  Concha Se, MD  triamcinolone cream (KENALOG) 0.5 % Apply 1 Application topically 3 (three) times daily. 05/11/22   Tommi Rumps, PA-C  triamcinolone ointment (KENALOG) 0.1 % Apply 1 Application topically 2 (two) times daily. 12/04/22   Georga Hacking, MD    Allergies Codeine  Family History  Problem Relation Age of Onset  Bladder Cancer Neg Hx    Kidney cancer Neg Hx    Prostate cancer Neg Hx     Social History Social History   Tobacco Use   Smoking status: Every Day    Current packs/day: 0.50    Types: Cigarettes   Smokeless tobacco: Never   Tobacco comments:    2-3 cigarettes/day   Vaping Use   Vaping status: Never Used  Substance Use Topics   Alcohol use: Yes    Alcohol/week: 2.0 standard drinks of alcohol    Types: 2 Cans of beer per week    Comment: weekends  couple of beer   Drug use: No    Review of Systems  Constitutional: Positive fever Eyes: No visual changes. ENT: Sore throat, runny nose, postnasal  drip Cardiovascular: Positive chest tightness/intermittent wheezing Respiratory: As it of cough Gastrointestinal: No abdominal pain.  No nausea, no vomiting.  No diarrhea.  No constipation. Musculoskeletal: Negative for back pain. Skin: Negative for rash. Neurological: Negative for headaches, focal weakness or numbness.   ____________________________________________   PHYSICAL EXAM:  VITAL SIGNS: ED Triage Vitals  Encounter Vitals Group     BP 08/26/23 1055 (!) 146/93     Systolic BP Percentile --      Diastolic BP Percentile --      Pulse Rate 08/26/23 1055 85     Resp 08/26/23 1055 16     Temp 08/26/23 1055 98.3 F (36.8 C)     Temp Source 08/26/23 1055 Oral     SpO2 08/26/23 1055 97 %     Weight 08/26/23 1050 220 lb (99.8 kg)     Height 08/26/23 1050 5\' 8"  (1.727 m)     Head Circumference --      Peak Flow --      Pain Score 08/26/23 1052 0     Pain Loc --      Pain Education --      Exclude from Growth Chart --     Constitutional: Alert and oriented. Well appearing and in no acute distress. Eyes: Conjunctivae are normal. PERRL. EOMI. Head: Atraumatic. Nose: No congestion/rhinnorhea. Mouth/Throat: Mucous membranes are moist.  Oropharynx non-erythematous. Neck: No stridor.   Hematological/Lymphatic/Immunilogical: No cervical lymphadenopathy. Cardiovascular: Normal rate, regular rhythm. Grossly normal heart sounds.  Good peripheral circulation. Respiratory: Normal respiratory effort.  No retractions.  Minimal wheezing heard in bilateral lower lobes. Gastrointestinal: Soft and nontender. No distention.  Neurologic:  Normal speech and language. No gross focal neurologic deficits are appreciated. No gait instability. Skin:  Skin is warm, dry and intact. No rash noted. Psychiatric: Mood and affect are normal. Speech and behavior are normal.  ____________________________________________   LABS (all labs ordered are listed, but only abnormal results are  displayed)  Labs Reviewed  SARS CORONAVIRUS 2 BY RT PCR   ____________________________________________  EKG   ____________________________________________  RADIOLOGY  ED MD interpretation: Chest x-ray reviewed by me and read by radiologist.  Official radiology report(s): DG Chest 2 View Result Date: 08/26/2023 CLINICAL DATA:  Cough EXAM: CHEST - 2 VIEW COMPARISON:  02/13/2023 FINDINGS: The heart size and mediastinal contours are within normal limits. Both lungs are clear. The visualized skeletal structures are unremarkable. IMPRESSION: No active cardiopulmonary disease. Electronically Signed   By: Duanne Guess D.O.   On: 08/26/2023 11:42    ____________________________________________   PROCEDURES  Procedure(s) performed: None  Procedures  Critical Care performed: No  ____________________________________________   INITIAL IMPRESSION / ASSESSMENT AND PLAN / ED COURSE  Douglas Singh is a 42 y.o. male presents to the emergency room with complaint of cold symptoms for the last 4 to 5 days.  Patient reports that he started with a cough, runny nose, sore throat, postnasal drip to 5 days ago.  He also had a fever for the first 24 to 48 hours.  Patient reports that the only symptom that remains is the persistent cough.  He reports that intermittently he does have some chest tightness/wheezing.  Patient does have history of asthma.  Patient reports that he used to have an albuterol inhaler but that it has expired.  Patient has been taking over-the-counter medications to treat his symptoms with minimal relief.  Chest x-ray shows no signs of infectious process COVID test is negative.  Exam is reassuring today.  Patient is noted to have slight wheeze in bilateral lower lobes.  Will provide him with a refill of his albuterol nebulizer. Will provide him with prednisone taper pack. Patient should follow-up with his primary care provider if he remains symptomatic after the next  72 hours.  Patient is in no acute distress at this time.  He will be discharged home in stable condition.      ____________________________________________   FINAL CLINICAL IMPRESSION(S) / ED DIAGNOSES  Final diagnoses:  Viral URI with cough  Acute cough     ED Discharge Orders          Ordered    albuterol (VENTOLIN HFA) 108 (90 Base) MCG/ACT inhaler  Every 6 hours PRN        08/26/23 1205    predniSONE (STERAPRED UNI-PAK 21 TAB) 10 MG (21) TBPK tablet        08/26/23 1205             Note:  This document was prepared using Dragon voice recognition software and may include unintentional dictation errors.     Herschell Dimes, NP 08/26/23 1331    Merwyn Katos, MD 08/26/23 1357

## 2023-08-26 NOTE — ED Triage Notes (Signed)
Pt to ED for congestion and cough with ywllow-green sputum since 4 days. Pt in NAD, skin dry.

## 2023-08-26 NOTE — ED Notes (Signed)
See triage note.Presents with cough and congestion   States cough is productive Afebrile

## 2024-05-03 ENCOUNTER — Emergency Department: Payer: Self-pay

## 2024-05-03 ENCOUNTER — Emergency Department
Admission: EM | Admit: 2024-05-03 | Discharge: 2024-05-03 | Disposition: A | Discharge status: Home / Self Care | Payer: Self-pay | Attending: Emergency Medicine | Admitting: Emergency Medicine

## 2024-05-03 ENCOUNTER — Other Ambulatory Visit: Payer: Self-pay

## 2024-05-03 DIAGNOSIS — J45909 Unspecified asthma, uncomplicated: Secondary | ICD-10-CM | POA: Insufficient documentation

## 2024-05-03 DIAGNOSIS — E871 Hypo-osmolality and hyponatremia: Secondary | ICD-10-CM | POA: Insufficient documentation

## 2024-05-03 DIAGNOSIS — N390 Urinary tract infection, site not specified: Secondary | ICD-10-CM | POA: Insufficient documentation

## 2024-05-03 DIAGNOSIS — J069 Acute upper respiratory infection, unspecified: Secondary | ICD-10-CM

## 2024-05-03 DIAGNOSIS — Z87891 Personal history of nicotine dependence: Secondary | ICD-10-CM | POA: Insufficient documentation

## 2024-05-03 LAB — BASIC METABOLIC PANEL WITH GFR
Anion gap: 9 (ref 5–15)
BUN: 15 mg/dL (ref 6–20)
CO2: 24 mmol/L (ref 22–32)
Calcium: 8.9 mg/dL (ref 8.9–10.3)
Chloride: 101 mmol/L (ref 98–111)
Creatinine, Ser: 0.97 mg/dL (ref 0.61–1.24)
GFR, Estimated: >60 mL/min (ref >60)
Glucose, Bld: 106 mg/dL — ABNORMAL HIGH (ref 70–99)
Potassium: 3.7 mmol/L (ref 3.5–5.1)
Sodium: 134 mmol/L — ABNORMAL LOW (ref 135–145)

## 2024-05-03 LAB — CBC
HCT: 45 % (ref 39.0–52.0)
Hemoglobin: 15.6 g/dL (ref 13.0–17.0)
MCH: 30.1 pg (ref 26.0–34.0)
MCHC: 34.7 g/dL (ref 30.0–36.0)
MCV: 86.7 fL (ref 80.0–100.0)
Platelets: 253 K/uL (ref 150–400)
RBC: 5.19 MIL/uL (ref 4.22–5.81)
RDW: 13.7 % (ref 11.5–15.5)
WBC: 6.5 K/uL (ref 4.0–10.5)
nRBC: 0 % (ref 0.0–0.2)

## 2024-05-03 LAB — TROPONIN I (HIGH SENSITIVITY): Troponin I (High Sensitivity): 2 ng/L (ref <18)

## 2024-05-03 MED ORDER — FLUTICASONE PROPIONATE 50 MCG/ACT NA SUSP: 1.0000 | Freq: Every day | NASAL | 0 refills | Status: AC

## 2024-05-03 MED ORDER — PREDNISONE 10 MG (21) PO TBPK: ORAL_TABLET | ORAL | 0 refills | Status: AC

## 2024-05-03 NOTE — Discharge Instructions (Signed)
Please follow up with primary care or return to the ER for symptoms of concern. 

## 2024-05-03 NOTE — ED Provider Notes (Signed)
 [facility] Provider Note    Event Date/Time   First MD Initiated Contact with Patient 05/03/24 1103     (approximate)   History   Shortness of Breath   HPI  Douglas Singh is a 43 y.o. male with history of anxiety, asthma and as listed in EMR presents to the emergency department for treatment and evaluation of shortness of breath and chest soreness for the past 3 to 4 days.  No known injury.  More short of breath with lying down and has increase in fatigue.  He has recently stopped smoking cigarettes and feels that he has been congested since.  No fever.  He occasionally coughs up thick mucus.  No known sick contacts.   Physical Exam    Vitals:   05/03/24 1028  BP: (!) 141/107  Pulse: 78  Resp: 18  Temp: 98 F (36.7 C)  SpO2: 99%    General: Awake, no distress.  CV:  Good peripheral perfusion.  Resp:  Normal effort.  Abd:  No distention.  Other:  Breath sounds clear.  Midsternal tenderness with palpation.   ED Results / Procedures / Treatments   Labs (all labs ordered are listed, but only abnormal results are displayed)  Labs Reviewed  BASIC METABOLIC PANEL WITH GFR - Abnormal; Notable for the following components:      Result Value   Sodium 134 (*)    Glucose, Bld 106 (*)    All other components within normal limits  CBC  TROPONIN I (HIGH SENSITIVITY)  TROPONIN I (HIGH SENSITIVITY)     EKG  Normal sinus rhythm with a rate of 75.  Unchanged from previous.   RADIOLOGY  Image and radiology report reviewed and interpreted by me. Radiology report consistent with the same.  No acute cardiopulmonary concerns on chest x-ray  PROCEDURES:  Critical Care performed: No  Procedures   MEDICATIONS ORDERED IN ED:  Medications - No data to display   IMPRESSION / MDM / ASSESSMENT AND PLAN / ED COURSE   I have reviewed the triage note and vital signs. Vital signs stable.   Differential diagnosis includes, but is not limited to,  viral syndrome, asthma, PE, cardiac event  Patient's presentation is most consistent with acute presentation with potential threat to life or bodily function.  43 year old male presenting to the emergency department for treatment and evaluation of shortness of breath and chest soreness.  Soreness is reproducible.  Lab studies are reassuring.  Troponin is 2.  Symptoms started a few days ago therefore repeat troponin is not necessary.  Chest x-ray is negative for acute concerns.  Vital signs are reassuring. PERC negative.   Plan will be to treat him with prednisone  taper to help clear congestion likely due to to smoking cessation or URI.  There is no indication for antibiotics at this time.  He is also requesting a nasal spray and fluticasone  was prescribed.  Outpatient follow-up and ER return precautions discussed.  Patient discharged in stable condition.      FINAL CLINICAL IMPRESSION(S) / ED DIAGNOSES   Final diagnoses:  Acute URI     Rx / DC Orders   ED Discharge Orders          Ordered    predniSONE  (STERAPRED UNI-PAK 21 TAB) 10 MG (21) TBPK tablet        05/03/24 1154    fluticasone  (FLONASE ) 50 MCG/ACT nasal spray  Daily        05/03/24 1206  Note:  This document was prepared using Dragon voice recognition software and may include unintentional dictation errors.   Herlinda Kirk NOVAK, FNP 05/03/24 1751    Willo Dunnings, MD 05/03/24 [phone]

## 2024-05-03 NOTE — ED Triage Notes (Signed)
 Pt comes with sob and chest soreness. Pt states this started 3-4 days ago. Pt denies any known injuries. Pt states more sob when laying down. Pt states increased fatigue.
